# Patient Record
Sex: Female | Born: 1995 | Race: White | Hispanic: No | Marital: Single | Smoking: Never smoker
Health system: Southern US, Community
[De-identification: ages and names within clinical notes are randomized; demographics above are authoritative.]

## PROBLEM LIST (undated history)

## (undated) DIAGNOSIS — F329 Major depressive disorder, single episode, unspecified: Secondary | ICD-10-CM

## (undated) DIAGNOSIS — F32A Depression, unspecified: Secondary | ICD-10-CM

## (undated) HISTORY — PX: WISDOM TOOTH EXTRACTION: SHX21

## (undated) HISTORY — PX: CYST EXCISION: SHX5701

## (undated) HISTORY — DX: Depression, unspecified: F32.A

---
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
## 2013-10-31 ENCOUNTER — Emergency Department (HOSPITAL_COMMUNITY): Payer: BC Managed Care – PPO

## 2013-10-31 ENCOUNTER — Emergency Department (HOSPITAL_COMMUNITY)
Admission: EM | Admit: 2013-10-31 | Discharge: 2013-11-01 | Disposition: A | Discharge status: Home / Self Care | Payer: BC Managed Care – PPO | Attending: Emergency Medicine | Admitting: Emergency Medicine

## 2013-10-31 ENCOUNTER — Encounter (HOSPITAL_COMMUNITY): Payer: Self-pay | Admitting: Emergency Medicine

## 2013-10-31 DIAGNOSIS — Y9368 Activity, volleyball (beach) (court): Secondary | ICD-10-CM | POA: Insufficient documentation

## 2013-10-31 DIAGNOSIS — X500XXA Overexertion from strenuous movement or load, initial encounter: Secondary | ICD-10-CM | POA: Insufficient documentation

## 2013-10-31 DIAGNOSIS — Y9239 Other specified sports and athletic area as the place of occurrence of the external cause: Secondary | ICD-10-CM | POA: Insufficient documentation

## 2013-10-31 DIAGNOSIS — S93499A Sprain of other ligament of unspecified ankle, initial encounter: Secondary | ICD-10-CM | POA: Insufficient documentation

## 2013-10-31 DIAGNOSIS — S93401A Sprain of unspecified ligament of right ankle, initial encounter: Secondary | ICD-10-CM

## 2013-10-31 MED ORDER — HYDROCODONE-ACETAMINOPHEN 5-325 MG PO TABS
1.0000 | ORAL_TABLET | Freq: Once | ORAL | Status: AC
  Administered 2013-10-31: 1 via ORAL
  Filled 2013-10-31: qty 1

## 2013-10-31 NOTE — ED Notes (Signed)
Pt injured her ankle during volleyball practice tonight

## 2013-10-31 NOTE — ED Provider Notes (Signed)
CSN: [id number]     Arrival date & time 10/31/13  2326 History   First MD Initiated Contact with Patient 10/31/13 2333     Chief Complaint  Patient presents with  . Ankle Pain   (Consider location/radiation/quality/duration/timing/severity/associated sxs/prior Treatment) HPI History provided by pt.   Pt was playing volleyball and felt 2 pops over her achilles tendon and one at lateral ankle.  Does not know how the injury occurred.  Has had pain of lateral ankle and over achilles w/ inability to bear weight ever since.  Pain aggravated by minimal ROM.  No associated paresthesias.   History reviewed. No pertinent past medical history. History reviewed. No pertinent past surgical history. History reviewed. No pertinent family history. History  Substance Use Topics  . Smoking status: Never Smoker   . Smokeless tobacco: Not on file  . Alcohol Use: No   OB History   Grav Para Term Preterm Abortions TAB SAB Ect Mult Living                 Review of Systems  All other systems reviewed and are negative.    Allergies  Review of patient's allergies indicates not on file.  Home Medications  No current outpatient prescriptions on file. BP 131/90  Pulse 111  Temp(Src) 98.5 F (36.9 C) (Oral)  Ht 5\' 5"  (1.651 m)  Wt 160 lb (72.576 kg)  BMI 26.63 kg/m2  SpO2 100%  LMP 10/31/2013 Physical Exam  Nursing note and vitals reviewed. Constitutional: She is oriented to person, place, and time. She appears well-developed and well-nourished. No distress.  HENT:  Head: Normocephalic and atraumatic.  Eyes:  Normal appearance  Neck: Normal range of motion.  Pulmonary/Chest: Effort normal.  Musculoskeletal: Normal range of motion.  No deformity, edema, ecchymosis right foot/ankle.  Achilles intact with palpation but very ttp.  Negative Thompson test, but very painful.  Tenderness inferior to lateral malleolus and pain w/ passive flexion/extension.  2+ DP pulse and distal sensation intact.     Neurological: She is alert and oriented to person, place, and time.  Psychiatric: She has a normal mood and affect. Her behavior is normal.    ED Course  Procedures (including critical care time) Labs Review Labs Reviewed - No data to display Imaging Review Dg Ankle Complete Right  11/01/2013   CLINICAL DATA:  Ankle pain after injury.  EXAM: RIGHT ANKLE - COMPLETE 3+ VIEW  COMPARISON:  None.  FINDINGS: There is no evidence of fracture, dislocation, or joint effusion. There is no evidence of arthropathy or other focal bone abnormality. Soft tissues are unremarkable.  IMPRESSION: Negative.   Electronically Signed   By: Tiburcio Pea M.D.   On: 11/01/2013 00:13    EKG Interpretation   None       MDM   1. Sprain of right ankle, initial encounter    17yo F presents w/ right ankle injury.  I am suspicious for lateral ankle as well as achilles sprain.  Xray pending.  1 vicodin ordered for pain.  11:42 PM   Xray negative.  Results discussed w/ pt.  Pt received one vicodin for pain in ED.  Ortho tech fitted her with ASO and provided her with crutches.  I recommended NSAID and RICE at home.  Referred to ortho for persistent/worsening sx.  12:22 AM     Otilio Miu, PA-C 11/01/13 0022

## 2013-11-01 MED ORDER — IBUPROFEN 800 MG PO TABS: 800.0000 mg | ORAL_TABLET | Freq: Three times a day (TID) | ORAL | Status: DC

## 2013-11-01 NOTE — ED Provider Notes (Signed)
Medical screening examination/treatment/procedure(s) were performed by non-physician practitioner and as supervising physician I was immediately available for consultation/collaboration.    Benny Henrie M Jahnay Lantier, MD 11/01/13 0336 

## 2014-02-07 ENCOUNTER — Emergency Department (HOSPITAL_COMMUNITY)
Admission: EM | Admit: 2014-02-07 | Discharge: 2014-02-07 | Disposition: A | Discharge status: Home / Self Care | Payer: BC Managed Care – PPO | Attending: Emergency Medicine | Admitting: Emergency Medicine

## 2014-02-07 ENCOUNTER — Encounter (HOSPITAL_COMMUNITY): Payer: Self-pay | Admitting: Emergency Medicine

## 2014-02-07 DIAGNOSIS — R110 Nausea: Secondary | ICD-10-CM | POA: Insufficient documentation

## 2014-02-07 DIAGNOSIS — Z791 Long term (current) use of non-steroidal anti-inflammatories (NSAID): Secondary | ICD-10-CM | POA: Insufficient documentation

## 2014-02-07 DIAGNOSIS — J029 Acute pharyngitis, unspecified: Secondary | ICD-10-CM | POA: Insufficient documentation

## 2014-02-07 MED ORDER — ACETAMINOPHEN-CODEINE #3 300-30 MG PO TABS: 1.0000 | ORAL_TABLET | Freq: Four times a day (QID) | ORAL | Status: DC | PRN

## 2014-02-07 NOTE — Discharge Instructions (Signed)
Viral Pharyngitis Viral pharyngitis is a viral infection that produces redness, pain, and swelling (inflammation) of the throat. It can spread from person to person (contagious). CAUSES Viral pharyngitis is caused by inhaling a large amount of certain germs called viruses. Many different viruses cause viral pharyngitis. SYMPTOMS Symptoms of viral pharyngitis include:  Sore throat.  Tiredness.  Stuffy nose.  Low-grade fever.  Congestion.  Cough. TREATMENT Treatment includes rest, drinking plenty of fluids, and the use of over-the-counter medication (approved by your caregiver). HOME CARE INSTRUCTIONS   Drink enough fluids to keep your urine clear or pale yellow.  Eat soft, cold foods such as ice cream, frozen ice pops, or gelatin dessert.  Gargle with warm salt water (1 tsp salt per 1 qt of water).  If over age 7, throat lozenges may be used safely.  Only take over-the-counter or prescription medicines for pain, discomfort, or fever as directed by your caregiver. Do not take aspirin. To help prevent spreading viral pharyngitis to others, avoid:  Mouth-to-mouth contact with others.  Sharing utensils for eating and drinking.  Coughing around others. SEEK MEDICAL CARE IF:   You are better in a few days, then become worse.  You have a fever or pain not helped by pain medicines.  There are any other changes that concern you. Document Released: 09/10/2005 Document Revised: 02/23/2012 Document Reviewed: 02/06/2011 Exit[employer] Patient Information 2014 [employer].  

## 2014-02-07 NOTE — ED Notes (Signed)
Pt reports 12 day hx of cough, nausea, sore throat. Tx with OTC meds with minimal relief. Pt is AO x3 .Friend at bedside

## 2014-02-07 NOTE — ED Provider Notes (Signed)
CSN: [id number]     Arrival date & time 02/07/14  1133 History  This chart was scribed for non-physician practitioner, Roxy Horsemanobert Bettye Sitton, PA-C working with Juliet RudeNathan R. Rubin PayorPickering, MD by Greggory StallionKayla Andersen, ED scribe. This patient was seen in room WTR9/WTR9 and the patient's care was started at 12:16 PM.   Chief Complaint  Patient presents with  . Sore Throat  . Nausea  . Cough     x 12 days   The history is provided by the patient. No language interpreter was used.   HPI Comments: Daisy Meyers is a 18 y.o. female who presents to the Emergency Department complaining of gradual onset, constant sore throat that started 12 days ago. Pt has also had rhinorrhea, cough and nausea. She is unsure if she has had a fever but does not currently have one. Pt has taken DayQuil with some relief of symptoms. Denise emesis, diarrhea, constipation. Denies sick contacts.   History reviewed. No pertinent past medical history. Past Surgical History  Procedure Laterality Date  . Cyst excision     History reviewed. No pertinent family history. History  Substance Use Topics  . Smoking status: Never Smoker   . Smokeless tobacco: Not on file  . Alcohol Use: Yes   OB History   Grav Para Term Preterm Abortions TAB SAB Ect Mult Living                 Review of Systems  Constitutional: Negative for fever.  HENT: Positive for rhinorrhea and sore throat.   Eyes: Negative for redness.  Respiratory: Positive for cough.   Gastrointestinal: Positive for nausea. Negative for vomiting, diarrhea and constipation.  Musculoskeletal: Negative for gait problem.  Skin: Negative for wound.  Neurological: Negative for speech difficulty.  Psychiatric/Behavioral: Negative for confusion.   Allergies  Review of patient's allergies indicates no known allergies.  Home Medications   Current Outpatient Rx  Name  Route  Sig  Dispense  Refill  . ibuprofen (ADVIL,MOTRIN) 800 MG tablet   Oral   Take 1 tablet (800 mg total) by  mouth 3 (three) times daily.   12 tablet   0    BP 126/69  Pulse 89  Temp(Src) 97.5 F (36.4 C) (Oral)  Resp 18  Wt 185 lb (83.915 kg)  SpO2 99%  LMP 01/24/2014  Physical Exam  Nursing note and vitals reviewed. Constitutional: She is oriented to person, place, and time. She appears well-developed and well-nourished. No distress.  HENT:  Head: Normocephalic and atraumatic.  Mouth/Throat: Uvula is midline. Posterior oropharyngeal erythema present. No oropharyngeal exudate.  No peritonsillar abscess. Airway intact.   Eyes: Conjunctivae and EOM are normal.  Cardiovascular: Normal rate and regular rhythm.   Pulmonary/Chest: Effort normal and breath sounds normal. No stridor. No respiratory distress. She has no wheezes. She has no rhonchi. She has no rales.  Abdominal: She exhibits no distension.  Musculoskeletal: She exhibits no edema.  Lymphadenopathy:    She has no cervical adenopathy.  Neurological: She is alert and oriented to person, place, and time. No cranial nerve deficit.  Skin: Skin is warm and dry.  Psychiatric: She has a normal mood and affect.    ED Course  Procedures (including critical care time)  DIAGNOSTIC STUDIES: Oxygen Saturation is 99% on RA, normal by my interpretation.    COORDINATION OF CARE: 12:19 PM-Discussed treatment plan which includes cough medication and Claritin or zyrtec with pt at bedside and pt agreed to plan.   Labs Review Labs Reviewed -  No data to display Imaging Review No results found.  EKG Interpretation   None       MDM   Final diagnoses:  Viral pharyngitis    Pt afebrile without tonsillar exudate, negative strep. Presents with mild cervical lymphadenopathy, & dysphagia; diagnosis of viral pharyngitis. No abx indicated. DC w symptomatic tx for pain  Pt does not appear dehydrated, but did discuss importance of water rehydration. Presentation non concerning for PTA or infxn spread to soft tissue. No trismus or uvula  deviation. Specific return precautions discussed. Pt able to drink water in ED without difficulty with intact air way. Recommended PCP follow up.  I personally performed the services described in this documentation, which was scribed in my presence. The recorded information has been reviewed and is accurate.    Roxy Horseman, PA-C 02/07/14 1702

## 2014-02-08 NOTE — ED Provider Notes (Signed)
Medical screening examination/treatment/procedure(s) were performed by non-physician practitioner and as supervising physician I was immediately available for consultation/collaboration.  EKG Interpretation   None        Michaeljames Milnes R. Audery Wassenaar, MD 02/08/14 0649 

## 2014-02-11 ENCOUNTER — Ambulatory Visit (INDEPENDENT_AMBULATORY_CARE_PROVIDER_SITE_OTHER): Payer: BC Managed Care – PPO | Admitting: Emergency Medicine

## 2014-02-11 ENCOUNTER — Ambulatory Visit: Payer: BC Managed Care – PPO

## 2014-02-11 VITALS — BP 104/70 | HR 104 | Temp 98.1°F | Resp 20 | Ht 64.0 in | Wt 184.0 lb

## 2014-02-11 DIAGNOSIS — R059 Cough, unspecified: Secondary | ICD-10-CM

## 2014-02-11 DIAGNOSIS — R05 Cough: Secondary | ICD-10-CM

## 2014-02-11 DIAGNOSIS — J209 Acute bronchitis, unspecified: Secondary | ICD-10-CM

## 2014-02-11 DIAGNOSIS — J029 Acute pharyngitis, unspecified: Secondary | ICD-10-CM

## 2014-02-11 LAB — POCT RAPID STREP A (OFFICE): Rapid Strep A Screen: NEGATIVE

## 2014-02-11 MED ORDER — HYDROCOD POLST-CHLORPHEN POLST 10-8 MG/5ML PO LQCR: 5.0000 mL | Freq: Two times a day (BID) | ORAL | Status: DC | PRN

## 2014-02-11 MED ORDER — AZITHROMYCIN 250 MG PO TABS: ORAL_TABLET | ORAL | Status: DC

## 2014-02-11 NOTE — Patient Instructions (Signed)

## 2014-02-11 NOTE — Progress Notes (Signed)
[facility] [address], [place] [id number] [phone]  Date:  02/11/2014   Name:  Daisy AlaminStacey Aslinger   DOB:  [birth year]   MRN:  [id number]  PCP:  No primary provider on file.    Chief Complaint: Cough, Sore Throat, Otalgia and Nausea   History of Present Illness:  Daisy AlaminStacey Meyers is a 18 y.o. very pleasant female patient who presents with the following:  Cough for past two weeks.  Cough is productive scant purulent sputum.  No wheezing or shortness of breath.  No nausea or vomiting.  Has a sore throat.  Seen in ER 2/24 and told was viral URI.  No testing done.  No fever or chills  No rash.  No improvement with over the counter medications or other home remedies. Denies other complaint or health concern today.   There are no active problems to display for this patient.   History reviewed. No pertinent past medical history.  Past Surgical History  Procedure Laterality Date  . Cyst excision      History  Substance Use Topics  . Smoking status: Never Smoker   . Smokeless tobacco: Not on file  . Alcohol Use: Yes    History reviewed. No pertinent family history.  No Known Allergies  Medication list has been reviewed and updated.  Current Outpatient Prescriptions on File Prior to Visit  Medication Sig Dispense Refill  . acetaminophen-codeine (TYLENOL #3) 300-30 MG per tablet Take 1-2 tablets by mouth every 6 (six) hours as needed for moderate pain.  15 tablet  0  . Pseudoeph-Doxylamine-DM-APAP (DAYQUIL/NYQUIL COLD/FLU RELIEF PO) Take 2 capsules by mouth daily.      . Multiple Vitamin (MULTIVITAMIN WITH MINERALS) TABS tablet Take 2 tablets by mouth daily.       No current facility-administered medications on file prior to visit.    Review of Systems:  As per HPI, otherwise negative.    Physical Examination: Filed Vitals:   02/11/14 1739  BP: 104/70  Pulse: 104  Temp: 98.1 F (36.7 C)  Resp: 20   Filed Vitals:   02/11/14 1739  Height: 5'  4" (1.626 m)  Weight: 184 lb (83.462 kg)   Body mass index is 31.57 kg/(m^2). Ideal Body Weight: Weight in (lb) to have BMI = 25: 145.3  GEN: WDWN, NAD, Non-toxic, A & O x 3 HEENT: Atraumatic, Normocephalic. Neck supple. No masses, No LAD. Ears and Nose: No external deformity. CV: RRR, No M/G/R. No JVD. No thrill. No extra heart sounds. PULM: CTA B, no wheezes, crackles, rhonchi. No retractions. No resp. distress. No accessory muscle use. ABD: S, NT, ND, +BS. No rebound. No HSM. EXTR: No c/c/e NEURO Normal gait.  PSYCH: Normally interactive. Conversant. Not depressed or anxious appearing.  Calm demeanor.    Assessment and Plan: Bronchitis Pharyngitis zpak tussionex   Signed,  Phillips OdorJeffery Shaunika Italiano, MD   Results for orders placed in visit on 02/11/14  POCT RAPID STREP A (OFFICE)      Result Value Ref Range   Rapid Strep A Screen Negative  Negative     [facility] reading (PRIMARY) by  Dr. Dareen PianoAnderson.  Negative chest.

## 2014-09-02 ENCOUNTER — Ambulatory Visit (INDEPENDENT_AMBULATORY_CARE_PROVIDER_SITE_OTHER): Payer: BC Managed Care – PPO | Admitting: Emergency Medicine

## 2014-09-02 VITALS — BP 108/70 | HR 96 | Temp 97.6°F | Resp 18 | Ht 65.0 in | Wt 185.0 lb

## 2014-09-02 DIAGNOSIS — J209 Acute bronchitis, unspecified: Secondary | ICD-10-CM

## 2014-09-02 DIAGNOSIS — J018 Other acute sinusitis: Secondary | ICD-10-CM

## 2014-09-02 MED ORDER — PROMETHAZINE-CODEINE 6.25-10 MG/5ML PO SYRP: 5.0000 mL | ORAL_SOLUTION | Freq: Four times a day (QID) | ORAL | Status: DC | PRN

## 2014-09-02 MED ORDER — PSEUDOEPHEDRINE-GUAIFENESIN ER 60-600 MG PO TB12: 1.0000 | ORAL_TABLET | Freq: Two times a day (BID) | ORAL | Status: DC

## 2014-09-02 MED ORDER — AMOXICILLIN-POT CLAVULANATE 875-125 MG PO TABS: 1.0000 | ORAL_TABLET | Freq: Two times a day (BID) | ORAL | Status: DC

## 2014-09-02 NOTE — Patient Instructions (Signed)

## 2014-09-02 NOTE — Progress Notes (Signed)
[facility] [address] [place] [address] [phone]  Date:  09/02/2014   Name:  Daisy Meyers   DOB:  08-23-1996   MRN:  [id number]  PCP:  No primary provider on file.    Chief Complaint: Nasal Congestion and Cough   History of Present Illness:  Daisy Meyers is a 18 y.o. very pleasant female patient who presents with the following:  Ill for two weeks with nasal congestion and purulent nasal drainage.  Has post nasal drip. Cough productive of mucopurulent sputum.  No wheezing or shortness of breath.  No headache or sore throat No fever or chills. No nausea or vomiting. No stool change No improvement with over the counter medications or other home remedies. Denies other complaint or health concern today.   There are no active problems to display for this patient.   History reviewed. No pertinent past medical history.  Past Surgical History  Procedure Laterality Date  . Cyst excision    . Wisdom tooth extraction      History  Substance Use Topics  . Smoking status: Never Smoker   . Smokeless tobacco: Not on file  . Alcohol Use: Yes    History reviewed. No pertinent family history.  No Known Allergies  Medication list has been reviewed and updated.  Current Outpatient Prescriptions on File Prior to Visit  Medication Sig Dispense Refill  . Pseudoeph-Doxylamine-DM-APAP (DAYQUIL/NYQUIL COLD/FLU RELIEF PO) Take 2 capsules by mouth daily.      Marland Kitchen acetaminophen-codeine (TYLENOL #3) 300-30 MG per tablet Take 1-2 tablets by mouth every 6 (six) hours as needed for moderate pain.  15 tablet  0  . azithromycin (ZITHROMAX) 250 MG tablet Take 2 tabs PO x 1 dose, then 1 tab PO QD x 4 days  6 tablet  0  . chlorpheniramine-HYDROcodone (TUSSIONEX PENNKINETIC ER) 10-8 MG/5ML LQCR Take 5 mLs by mouth every 12 (twelve) hours as needed.  60 mL  0  . Multiple Vitamin (MULTIVITAMIN WITH MINERALS) TABS tablet Take 2 tablets by mouth daily.       No current  facility-administered medications on file prior to visit.    Review of Systems:  As per HPI, otherwise negative.    Physical Examination: Filed Vitals:   09/02/14 1441  BP: 108/70  Pulse: 96  Temp: 97.6 F (36.4 C)  Resp: 18   Filed Vitals:   09/02/14 1441  Height:  (1.651 m)  Weight: 185 lb (83.915 kg)   Body mass index is 30.79 kg/(m^2). Ideal Body Weight: Weight in (lb) to have BMI = 25: 149.9   GEN: WDWN, NAD, Non-toxic, Alert & Oriented x 3 HEENT: Atraumatic, Normocephalic.  Ears and Nose: No external deformity. EXTR: No clubbing/cyanosis/edema NEURO: Normal gait.  PSYCH: Normally interactive. Conversant. Not depressed or anxious appearing.  Calm demeanor.    Assessment and Plan: Sinusitis Bronchitis augmentin mucinex d Phen c cod  Signed,  Phillips Odor, MD

## 2014-10-27 ENCOUNTER — Other Ambulatory Visit: Payer: Self-pay | Admitting: *Deleted

## 2014-11-03 ENCOUNTER — Other Ambulatory Visit: Payer: Self-pay | Admitting: Orthopedic Surgery

## 2014-11-03 DIAGNOSIS — M25562 Pain in left knee: Secondary | ICD-10-CM

## 2014-11-03 DIAGNOSIS — M79672 Pain in left foot: Secondary | ICD-10-CM

## 2014-11-17 ENCOUNTER — Ambulatory Visit
Admission: RE | Admit: 2014-11-17 | Discharge: 2014-11-17 | Disposition: A | Discharge status: Home / Self Care | Payer: BC Managed Care – PPO | Source: Ambulatory Visit | Attending: Orthopedic Surgery | Admitting: Orthopedic Surgery

## 2014-11-17 DIAGNOSIS — M79672 Pain in left foot: Secondary | ICD-10-CM

## 2014-11-17 DIAGNOSIS — M25562 Pain in left knee: Secondary | ICD-10-CM

## 2015-03-14 ENCOUNTER — Ambulatory Visit (INDEPENDENT_AMBULATORY_CARE_PROVIDER_SITE_OTHER): Payer: BLUE CROSS/BLUE SHIELD | Admitting: Physician Assistant

## 2015-03-14 VITALS — BP 120/82 | HR 78 | Temp 97.8°F | Resp 16 | Ht 64.75 in | Wt 196.0 lb

## 2015-03-14 DIAGNOSIS — R220 Localized swelling, mass and lump, head: Secondary | ICD-10-CM

## 2015-03-14 DIAGNOSIS — R221 Localized swelling, mass and lump, neck: Secondary | ICD-10-CM

## 2015-03-14 DIAGNOSIS — M6248 Contracture of muscle, other site: Secondary | ICD-10-CM

## 2015-03-14 DIAGNOSIS — M62838 Other muscle spasm: Secondary | ICD-10-CM

## 2015-03-14 MED ORDER — CYCLOBENZAPRINE HCL 5 MG PO TABS: 5.0000 mg | ORAL_TABLET | Freq: Three times a day (TID) | ORAL | Status: DC | PRN

## 2015-03-14 NOTE — Progress Notes (Signed)
Subjective:    Patient ID: Daisy Meyers, female    DOB: 1996-08-31, 18 y.o.   MRN: [id number]  Chief Complaint  Patient presents with  . Neck Pain    Awoke with it  . Mass    back of neck/onset 1 year   There are no active problems to display for this patient.  Prior to Admission medications   Medication Sig Start Date End Date Taking? Authorizing Provider  acetaminophen-codeine (TYLENOL #3) 300-30 MG per tablet Take 1-2 tablets by mouth every 6 (six) hours as needed for moderate pain. 02/07/14  Yes Roxy Horsemanobert Browning, PA-C  levonorgestrel-ethinyl estradiol (AVIANE,ALESSE,LESSINA) 0.1-20 MG-MCG tablet Take 1 tablet by mouth daily.   Yes Historical Provider, MD  Multiple Vitamin (MULTIVITAMIN WITH MINERALS) TABS tablet Take 2 tablets by mouth daily.   Yes Historical Provider, MD  cyclobenzaprine (FLEXERIL) 5 MG tablet Take 1 tablet (5 mg total) by mouth 3 (three) times daily as needed for muscle spasms. 03/14/15   Donnajean Lopesodd M Bedie Dominey, PA   Medications, allergies, past medical history, surgical history, family history, social history and problem list reviewed and updated.  HPI  18 yof with no significant pmh presents with neck pain, swelling.   Woke up this am with pain right side back neck. No trauma. No new activities past few days. Has been painful to turn head to right or extend neck.   She also mentions she has had a persistent mass over the same area of the neck for past year. Approx 1" diameter. Lump has not been painful until this am. The pain is located exactly where the lump is.   Denies fever, chills, unintentional wt loss, night sweats, persistent cough, trouble breathing or swallowing.   Review of Systems See HPI.     Objective:   Physical Exam  Constitutional: She is oriented to person, place, and time. She appears well-developed and well-nourished.  Non-toxic appearance. She does not have a sickly appearance. She does not appear ill. No distress.  BP 120/82 mmHg  Pulse 78   Temp(Src) 97.8 F (36.6 C) (Oral)  Resp 16  Ht 5' 4.75" (1.645 m)  Wt 196 lb (88.905 kg)  BMI 32.85 kg/m2  SpO2 100%  LMP 02/17/2015   Neck:    Approx 1" x 1" firm mass over circled area. No overlying erythema. No overlying central punctum. No fluctuance. No surrounding induration. No purulence. TTP over area. Limited rom with neck ext or rotation to right due to pain in area.   Lymphadenopathy:       Head (right side): No submental, no submandibular and no tonsillar adenopathy present.       Head (left side): No submental, no submandibular and no tonsillar adenopathy present.    She has no cervical adenopathy.  Neurological: She is alert and oriented to person, place, and time.  Psychiatric: She has a normal mood and affect. Her speech is normal and behavior is normal.      Assessment & Plan:   18 yof with no significant pmh presents with neck pain, swelling.   Muscle spasms of neck - Plan: cyclobenzaprine (FLEXERIL) 5 MG tablet Swelling, mass, or lump in head and neck - Plan: US Soft Tissue Head/Neck --mass likely a muscle spasm over area, limited rom, ttp, no skin changes to suggest abscess or cyst, no LAN, no constitutional sx --spasm --> flexeril, heat, massage, light rom --neck us ordered as has been present for one year  Donnajean Lopesodd M. Milanie Rosenfield, PA-C Physician Assistant-Certified  [facility]  03/14/2015 3:01 PM

## 2015-03-14 NOTE — Patient Instructions (Signed)
Your swelling is most likely a muscle spasm over the area. Heat, massage, light range of motion and flexeril every 8 hours as needed will help with this. To be safe we have ordered an ultrasound, they'll be in touch with you to get that ordered.  Please come back asap or ER if pain increases.

## 2015-03-22 ENCOUNTER — Ambulatory Visit
Admission: RE | Admit: 2015-03-22 | Discharge: 2015-03-22 | Disposition: A | Discharge status: Home / Self Care | Payer: BLUE CROSS/BLUE SHIELD | Source: Ambulatory Visit | Attending: Physician Assistant | Admitting: Physician Assistant

## 2015-03-22 DIAGNOSIS — R220 Localized swelling, mass and lump, head: Secondary | ICD-10-CM

## 2015-03-22 DIAGNOSIS — R221 Localized swelling, mass and lump, neck: Principal | ICD-10-CM

## 2016-02-18 ENCOUNTER — Ambulatory Visit (INDEPENDENT_AMBULATORY_CARE_PROVIDER_SITE_OTHER): Payer: BLUE CROSS/BLUE SHIELD | Admitting: Family Medicine

## 2016-02-18 VITALS — BP 134/85 | HR 104 | Temp 98.2°F | Resp 20 | Ht 65.0 in | Wt 196.8 lb

## 2016-02-18 DIAGNOSIS — J329 Chronic sinusitis, unspecified: Secondary | ICD-10-CM | POA: Diagnosis not present

## 2016-02-18 DIAGNOSIS — J310 Chronic rhinitis: Secondary | ICD-10-CM

## 2016-02-18 MED ORDER — AMOXICILLIN 875 MG PO TABS: 875.0000 mg | ORAL_TABLET | Freq: Two times a day (BID) | ORAL | Status: DC

## 2016-02-18 MED ORDER — IPRATROPIUM BROMIDE 0.03 % NA SOLN: 2.0000 | Freq: Two times a day (BID) | NASAL | Status: DC

## 2016-02-18 NOTE — Progress Notes (Signed)
Patient ID: Daisy AlaminStacey Meyers, female    DOB: [birth year]  Age: 20 y.o. MRN: [id number]  Chief Complaint  Patient presents with  . Sore Throat    x 1 week  . Nasal Congestion  . Hoarse    Subjective:   20 year old [facility] [employer] who comes in with a history of having had a sore throat over the past week. She's had some hoarseness. She still has a sore throat, not severe. She has head congestion purulent drainage from her nose. She does not smoke. She has a little cough, not severe. She has not had major fever aches or chills.  Current allergies, medications, problem list, past/family and social histories reviewed.  Objective:  BP 134/85 mmHg  Pulse 104  Temp(Src) 98.2 F (36.8 C) (Oral)  Resp 20  Ht 5\' 5"  (1.651 m)  Wt 196 lb 12.8 oz (89.268 kg)  BMI 32.75 kg/m2  SpO2 98%  LMP 01/17/2016  Pleasant young lady, healthy appearing. TMs are normal. Throat mildly erythematous but no exudate. No lymphadenopathy in the neck. Chest is clear to auscultation. Heart regular without murmur.  Assessment & Plan:   Assessment: 1. Rhinosinusitis       Plan: Viral URI/rhinosinusitis. We'll go ahead and give her antibiotics as is been going on fairly long. Return if not improving. No diagnostic testing done today.  No orders of the defined types were placed in this encounter.    Meds ordered this encounter  Medications  . amoxicillin (AMOXIL) 875 MG tablet    Sig: Take 1 tablet (875 mg total) by mouth 2 (two) times daily.    Dispense:  20 tablet    Refill:  0  . ipratropium (ATROVENT) 0.03 % nasal spray    Sig: Place 2 sprays into both nostrils 2 (two) times daily.    Dispense:  30 mL    Refill:  0         Patient Instructions  Drink plenty of fluids and get enough rest  Use the Atrovent nasal spray 2 sprays each nostril up to 4 times daily as needed for head congestion  Take the Mucinex to thin secretions  Take an over-the-counter antihistamine decongestant such  as Claritin-D or Allegra-D or Zyrtec-D 1 daily for the congestion  Take amoxicillin 875 mg one twice daily for infection  Return if problems     Return if symptoms worsen or fail to improve.   Puneet Selden, MD 02/18/2016

## 2016-02-18 NOTE — Patient Instructions (Signed)
Drink plenty of fluids and get enough rest  Use the Atrovent nasal spray 2 sprays each nostril up to 4 times daily as needed for head congestion  Take the Mucinex to thin secretions  Take an over-the-counter antihistamine decongestant such as Claritin-D or Allegra-D or Zyrtec-D 1 daily for the congestion  Take amoxicillin 875 mg one twice daily for infection  Return if problems

## 2016-05-17 DIAGNOSIS — N390 Urinary tract infection, site not specified: Secondary | ICD-10-CM | POA: Diagnosis not present

## 2016-05-17 DIAGNOSIS — R300 Dysuria: Secondary | ICD-10-CM | POA: Diagnosis not present

## 2016-06-11 DIAGNOSIS — Z309 Encounter for contraceptive management, unspecified: Secondary | ICD-10-CM | POA: Diagnosis not present

## 2016-06-11 DIAGNOSIS — N946 Dysmenorrhea, unspecified: Secondary | ICD-10-CM | POA: Diagnosis not present

## 2016-08-06 DIAGNOSIS — S91322A Laceration with foreign body, left foot, initial encounter: Secondary | ICD-10-CM | POA: Diagnosis not present

## 2016-08-14 ENCOUNTER — Encounter (HOSPITAL_COMMUNITY): Payer: Self-pay | Admitting: *Deleted
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
## 2016-08-15 ENCOUNTER — Ambulatory Visit (HOSPITAL_COMMUNITY): Payer: BLUE CROSS/BLUE SHIELD | Admitting: Certified Registered"

## 2016-08-15 ENCOUNTER — Encounter (HOSPITAL_COMMUNITY)
Admission: RE | Disposition: A | Discharge status: Home / Self Care | Payer: Self-pay | Source: Ambulatory Visit | Attending: Orthopedic Surgery

## 2016-08-15 ENCOUNTER — Ambulatory Visit (HOSPITAL_COMMUNITY)
Admission: RE | Admit: 2016-08-15 | Discharge: 2016-08-15 | Disposition: A | Discharge status: Home / Self Care | Payer: BLUE CROSS/BLUE SHIELD | Source: Ambulatory Visit | Attending: Orthopedic Surgery | Admitting: Orthopedic Surgery

## 2016-08-15 ENCOUNTER — Ambulatory Visit (HOSPITAL_COMMUNITY): Payer: BLUE CROSS/BLUE SHIELD

## 2016-08-15 ENCOUNTER — Encounter (HOSPITAL_COMMUNITY): Payer: Self-pay | Admitting: *Deleted

## 2016-08-15 DIAGNOSIS — W458XXA Other foreign body or object entering through skin, initial encounter: Secondary | ICD-10-CM | POA: Diagnosis not present

## 2016-08-15 DIAGNOSIS — S91322A Laceration with foreign body, left foot, initial encounter: Secondary | ICD-10-CM | POA: Diagnosis not present

## 2016-08-15 DIAGNOSIS — Z419 Encounter for procedure for purposes other than remedying health state, unspecified: Secondary | ICD-10-CM

## 2016-08-15 DIAGNOSIS — S90852A Superficial foreign body, left foot, initial encounter: Secondary | ICD-10-CM | POA: Insufficient documentation

## 2016-08-15 HISTORY — PX: FOOT FOREIGN BODY REMOVAL: SUR1116

## 2016-08-15 HISTORY — PX: FOREIGN BODY REMOVAL: SHX962

## 2016-08-15 LAB — CBC
HCT: 40.7 % (ref 36.0–46.0)
Hemoglobin: 12.9 g/dL (ref 12.0–15.0)
MCH: 27.4 pg (ref 26.0–34.0)
MCHC: 31.7 g/dL (ref 30.0–36.0)
MCV: 86.4 fL (ref 78.0–100.0)
PLATELETS: 216 10*3/uL (ref 150–400)
RBC: 4.71 MIL/uL (ref 3.87–5.11)
RDW: 13.3 % (ref 11.5–15.5)
WBC: 7.6 10*3/uL (ref 4.0–10.5)

## 2016-08-15 LAB — PREGNANCY, URINE: PREG TEST UR: NEGATIVE

## 2016-08-15 SURGERY — REMOVAL FOREIGN BODY EXTREMITY
Anesthesia: General | Site: Foot | Laterality: Left

## 2016-08-15 MED ORDER — FENTANYL CITRATE (PF) 100 MCG/2ML IJ SOLN
25.0000 ug | INTRAMUSCULAR | Status: DC | PRN
  Administered 2016-08-15: 50 ug via INTRAVENOUS
  Administered 2016-08-15 (×2): 25 ug via INTRAVENOUS

## 2016-08-15 MED ORDER — MIDAZOLAM HCL 5 MG/5ML IJ SOLN
INTRAMUSCULAR | Status: DC | PRN
  Administered 2016-08-15: 2 mg via INTRAVENOUS

## 2016-08-15 MED ORDER — PROPOFOL 10 MG/ML IV BOLUS
INTRAVENOUS | Status: DC | PRN
  Administered 2016-08-15: 20 mg via INTRAVENOUS
  Administered 2016-08-15: 70 mg via INTRAVENOUS
  Administered 2016-08-15: 180 mg via INTRAVENOUS

## 2016-08-15 MED ORDER — ONDANSETRON HCL 4 MG/2ML IJ SOLN
INTRAMUSCULAR | Status: AC
  Filled 2016-08-15: qty 2

## 2016-08-15 MED ORDER — MIDAZOLAM HCL 2 MG/2ML IJ SOLN
INTRAMUSCULAR | Status: AC
  Filled 2016-08-15: qty 2

## 2016-08-15 MED ORDER — HYDROCODONE-ACETAMINOPHEN 5-325 MG PO TABS
ORAL_TABLET | ORAL | Status: AC
  Filled 2016-08-15: qty 2

## 2016-08-15 MED ORDER — FENTANYL CITRATE (PF) 100 MCG/2ML IJ SOLN
INTRAMUSCULAR | Status: AC
  Filled 2016-08-15: qty 4

## 2016-08-15 MED ORDER — LACTATED RINGERS IV SOLN: INTRAVENOUS | Status: DC

## 2016-08-15 MED ORDER — CHLORHEXIDINE GLUCONATE 4 % EX LIQD: 60.0000 mL | Freq: Once | CUTANEOUS | Status: DC

## 2016-08-15 MED ORDER — FENTANYL CITRATE (PF) 100 MCG/2ML IJ SOLN
INTRAMUSCULAR | Status: DC | PRN
  Administered 2016-08-15: 25 ug via INTRAVENOUS
  Administered 2016-08-15: 50 ug via INTRAVENOUS
  Administered 2016-08-15 (×3): 25 ug via INTRAVENOUS

## 2016-08-15 MED ORDER — LIDOCAINE HCL (CARDIAC) 20 MG/ML IV SOLN
INTRAVENOUS | Status: DC | PRN
  Administered 2016-08-15: 100 mg via INTRAVENOUS

## 2016-08-15 MED ORDER — FENTANYL CITRATE (PF) 100 MCG/2ML IJ SOLN
INTRAMUSCULAR | Status: AC
  Filled 2016-08-15: qty 2

## 2016-08-15 MED ORDER — CEFAZOLIN SODIUM-DEXTROSE 2-4 GM/100ML-% IV SOLN
INTRAVENOUS | Status: AC
  Filled 2016-08-15: qty 100

## 2016-08-15 MED ORDER — PROPOFOL 10 MG/ML IV BOLUS
INTRAVENOUS | Status: AC
  Filled 2016-08-15: qty 40

## 2016-08-15 MED ORDER — 0.9 % SODIUM CHLORIDE (POUR BTL) OPTIME
TOPICAL | Status: DC | PRN
  Administered 2016-08-15: 1000 mL

## 2016-08-15 MED ORDER — METOCLOPRAMIDE HCL 5 MG/ML IJ SOLN
10.0000 mg | Freq: Once | INTRAMUSCULAR | Status: DC | PRN
Start: 2016-08-15 — End: 2016-08-15

## 2016-08-15 MED ORDER — SODIUM CHLORIDE 0.9 % IR SOLN: Status: DC | PRN

## 2016-08-15 MED ORDER — ONDANSETRON HCL 4 MG/2ML IJ SOLN
INTRAMUSCULAR | Status: DC | PRN
  Administered 2016-08-15: 4 mg via INTRAVENOUS

## 2016-08-15 MED ORDER — MEPERIDINE HCL 25 MG/ML IJ SOLN: 6.2500 mg | INTRAMUSCULAR | Status: DC | PRN

## 2016-08-15 MED ORDER — DEXAMETHASONE SODIUM PHOSPHATE 10 MG/ML IJ SOLN
INTRAMUSCULAR | Status: DC | PRN
  Administered 2016-08-15: 5 mg via INTRAVENOUS

## 2016-08-15 MED ORDER — KETOROLAC TROMETHAMINE 30 MG/ML IJ SOLN
30.0000 mg | Freq: Once | INTRAMUSCULAR | Status: AC
  Administered 2016-08-15: 30 mg via INTRAVENOUS

## 2016-08-15 MED ORDER — LACTATED RINGERS IV SOLN
INTRAVENOUS | Status: DC | PRN
  Administered 2016-08-15: 08:00:00 via INTRAVENOUS

## 2016-08-15 MED ORDER — LIDOCAINE 2% (20 MG/ML) 5 ML SYRINGE
INTRAMUSCULAR | Status: AC
  Filled 2016-08-15: qty 5

## 2016-08-15 MED ORDER — HYDROCODONE-ACETAMINOPHEN 5-325 MG PO TABS: 1.0000 | ORAL_TABLET | Freq: Three times a day (TID) | ORAL | 0 refills | Status: DC | PRN

## 2016-08-15 MED ORDER — KETOROLAC TROMETHAMINE 10 MG PO TABS: 10.0000 mg | ORAL_TABLET | Freq: Four times a day (QID) | ORAL | 0 refills | Status: DC | PRN

## 2016-08-15 MED ORDER — DEXAMETHASONE SODIUM PHOSPHATE 10 MG/ML IJ SOLN
INTRAMUSCULAR | Status: AC
  Filled 2016-08-15: qty 1

## 2016-08-15 MED ORDER — KETOROLAC TROMETHAMINE 30 MG/ML IJ SOLN
INTRAMUSCULAR | Status: AC
  Filled 2016-08-15: qty 1

## 2016-08-15 MED ORDER — CEFAZOLIN SODIUM-DEXTROSE 2-4 GM/100ML-% IV SOLN
2.0000 g | INTRAVENOUS | Status: AC
  Administered 2016-08-15: 2 g via INTRAVENOUS
  Filled 2016-08-15: qty 100

## 2016-08-15 MED ORDER — HYDROCODONE-ACETAMINOPHEN 5-325 MG PO TABS
2.0000 | ORAL_TABLET | Freq: Once | ORAL | Status: AC
  Administered 2016-08-15: 2 via ORAL

## 2016-08-15 SURGICAL SUPPLY — 49 items
BANDAGE ACE 4X5 VEL STRL LF (GAUZE/BANDAGES/DRESSINGS) ×2 IMPLANT
BANDAGE GAUZE 4  KLING STR (GAUZE/BANDAGES/DRESSINGS) ×2 IMPLANT
BLADE SURG 10 STRL SS (BLADE) ×2 IMPLANT
BNDG COHESIVE 4X5 TAN STRL (GAUZE/BANDAGES/DRESSINGS) ×2 IMPLANT
BNDG GAUZE ELAST 4 BULKY (GAUZE/BANDAGES/DRESSINGS) ×4 IMPLANT
BNDG GAUZE STRTCH 6 (GAUZE/BANDAGES/DRESSINGS) ×2 IMPLANT
BRUSH SCRUB DISP (MISCELLANEOUS) ×4 IMPLANT
COVER SURGICAL LIGHT HANDLE (MISCELLANEOUS) ×4 IMPLANT
DRAPE C-ARMOR (DRAPES) ×2 IMPLANT
DRAPE U-SHAPE 47X51 STRL (DRAPES) ×2 IMPLANT
DRSG ADAPTIC 3X8 NADH LF (GAUZE/BANDAGES/DRESSINGS) ×2 IMPLANT
ELECT CAUTERY BLADE 6.4 (BLADE) IMPLANT
ELECT REM PT RETURN 9FT ADLT (ELECTROSURGICAL)
ELECTRODE REM PT RTRN 9FT ADLT (ELECTROSURGICAL) IMPLANT
GAUZE SPONGE 4X4 12PLY STRL (GAUZE/BANDAGES/DRESSINGS) ×2 IMPLANT
GLOVE BIO SURGEON STRL SZ7.5 (GLOVE) ×2 IMPLANT
GLOVE BIO SURGEON STRL SZ8 (GLOVE) ×2 IMPLANT
GLOVE BIOGEL PI IND STRL 7.5 (GLOVE) ×1 IMPLANT
GLOVE BIOGEL PI IND STRL 8 (GLOVE) ×1 IMPLANT
GLOVE BIOGEL PI INDICATOR 7.5 (GLOVE) ×1
GLOVE BIOGEL PI INDICATOR 8 (GLOVE) ×1
GOWN STRL REUS W/ TWL LRG LVL3 (GOWN DISPOSABLE) ×2 IMPLANT
GOWN STRL REUS W/ TWL XL LVL3 (GOWN DISPOSABLE) ×1 IMPLANT
GOWN STRL REUS W/TWL LRG LVL3 (GOWN DISPOSABLE) ×2
GOWN STRL REUS W/TWL XL LVL3 (GOWN DISPOSABLE) ×1
HANDPIECE INTERPULSE COAX TIP (DISPOSABLE)
KIT BASIN OR (CUSTOM PROCEDURE TRAY) ×2 IMPLANT
KIT ROOM TURNOVER OR (KITS) ×2 IMPLANT
MANIFOLD NEPTUNE II (INSTRUMENTS) ×2 IMPLANT
NS IRRIG 1000ML POUR BTL (IV SOLUTION) ×2 IMPLANT
PACK ORTHO EXTREMITY (CUSTOM PROCEDURE TRAY) ×2 IMPLANT
PAD ARMBOARD 7.5X6 YLW CONV (MISCELLANEOUS) ×4 IMPLANT
PADDING CAST ABS 4INX4YD NS (CAST SUPPLIES) ×1
PADDING CAST ABS COTTON 4X4 ST (CAST SUPPLIES) ×1 IMPLANT
PADDING CAST COTTON 6X4 STRL (CAST SUPPLIES) ×2 IMPLANT
SET HNDPC FAN SPRY TIP SCT (DISPOSABLE) IMPLANT
SPONGE GAUZE 4X4 12PLY STER LF (GAUZE/BANDAGES/DRESSINGS) ×2 IMPLANT
SPONGE LAP 18X18 X RAY DECT (DISPOSABLE) ×2 IMPLANT
STOCKINETTE IMPERVIOUS 9X36 MD (GAUZE/BANDAGES/DRESSINGS) ×2 IMPLANT
SUT ETHILON 3 0 PS 1 (SUTURE) ×2 IMPLANT
SUT PDS AB 2-0 CT1 27 (SUTURE) IMPLANT
SUT VIC AB 3-0 FS2 27 (SUTURE) ×2 IMPLANT
TOWEL OR 17X24 6PK STRL BLUE (TOWEL DISPOSABLE) ×2 IMPLANT
TOWEL OR 17X26 10 PK STRL BLUE (TOWEL DISPOSABLE) ×4 IMPLANT
TUBE ANAEROBIC SPECIMEN COL (MISCELLANEOUS) IMPLANT
TUBE CONNECTING 12X1/4 (SUCTIONS) ×2 IMPLANT
UNDERPAD 30X30 (UNDERPADS AND DIAPERS) ×2 IMPLANT
WATER STERILE IRR 1000ML POUR (IV SOLUTION) IMPLANT
YANKAUER SUCT BULB TIP NO VENT (SUCTIONS) ×2 IMPLANT

## 2016-08-15 NOTE — Anesthesia Preprocedure Evaluation (Signed)
Anesthesia Evaluation  Patient identified by MRN, date of birth, ID band Patient awake    Reviewed: Allergy & Precautions, NPO status , Patient's Chart, lab work & pertinent test results  Airway Mallampati: II  TM Distance: >3 FB Neck ROM: Full    Dental no notable dental hx.    Pulmonary neg pulmonary ROS,    Pulmonary exam normal breath sounds clear to auscultation       Cardiovascular negative cardio ROS Normal cardiovascular exam Rhythm:Regular Rate:Normal     Neuro/Psych negative neurological ROS  negative psych ROS   GI/Hepatic negative GI ROS, Neg liver ROS,   Endo/Other  negative endocrine ROS  Renal/GU negative Renal ROS  negative genitourinary   Musculoskeletal negative musculoskeletal ROS (+)   Abdominal   Peds negative pediatric ROS (+)  Hematology negative hematology ROS (+)   Anesthesia Other Findings   Reproductive/Obstetrics negative OB ROS                             Anesthesia Physical Anesthesia Plan  ASA: I  Anesthesia Plan: General   Post-op Pain Management:    Induction: Intravenous  Airway Management Planned: LMA  Additional Equipment:   Intra-op Plan:   Post-operative Plan: Extubation in OR  Informed Consent: I have reviewed the patients History and Physical, chart, labs and discussed the procedure including the risks, benefits and alternatives for the proposed anesthesia with the patient or authorized representative who has indicated his/her understanding and acceptance.   Dental advisory given  Plan Discussed with: CRNA  Anesthesia Plan Comments:         Anesthesia Quick Evaluation  

## 2016-08-15 NOTE — Anesthesia Procedure Notes (Signed)
Procedure Name: LMA Insertion Date/Time: 08/15/2016 8:22 AM Performed by: Lanell MatarBAKER, Masson Nalepa M Pre-anesthesia Checklist: Patient identified, Emergency Drugs available, Suction available and Patient being monitored Patient Re-evaluated:Patient Re-evaluated prior to inductionOxygen Delivery Method: Circle System Utilized Preoxygenation: Pre-oxygenation with 100% oxygen Intubation Type: IV induction Ventilation: Mask ventilation without difficulty LMA: LMA inserted LMA Size: 4.0 Number of attempts: 1 Placement Confirmation: positive ETCO2 Tube secured with: Tape Dental Injury: Teeth and Oropharynx as per pre-operative assessment  Comments: Performed by Mallie SnooksJennifer Campbell, SRNA

## 2016-08-15 NOTE — Transfer of Care (Signed)
Immediate Anesthesia Transfer of Care Note  Patient: Daisy AlaminStacey Meyers  Procedure(s) Performed: Procedure(s): REMOVAL FOREIGN BODY LEFT FOOT (Left)  Patient Location: PACU  Anesthesia Type:General  Level of Consciousness: awake, alert  and oriented  Airway & Oxygen Therapy: Patient Spontanous Breathing and Patient connected to nasal cannula oxygen  Post-op Assessment: Report given to RN, Post -op Vital signs reviewed and stable and Patient moving all extremities X 4  Post vital signs: Reviewed and stable  Last Vitals:  Vitals:   08/15/16 0627 08/15/16 0909  BP: 134/72   Pulse: 81   Resp: 18   Temp: 36.7 C (P) 36.4 C    Last Pain: There were no vitals filed for this visit.       Complications: No apparent anesthesia complications

## 2016-08-15 NOTE — Brief Op Note (Signed)
08/15/2016  4:09 PM  PATIENT:  Hildred AlaminStacey Hestand  20 y.o. female  PRE-OPERATIVE DIAGNOSIS:  loose body left foot  POST-OPERATIVE DIAGNOSIS:  loose body left foot  PROCEDURE:  Procedure(s): REMOVAL FOREIGN BODY LEFT FOOT (Left)  SURGEON:  Surgeon(s) and Role:    * Myrene GalasMichael Nakeysha Pasqual, MD - Primary  PHYSICIAN ASSISTANT: Montez MoritaKEITH PAUL, PAC  ANESTHESIA:   general  EBL:  Total I/O In: 500 [I.V.:500] Out: 3 [Blood:3]  BLOOD ADMINISTERED:none  DRAINS: none   LOCAL MEDICATIONS USED:  NONE  SPECIMEN:  No Specimen  DISPOSITION OF SPECIMEN:  N/A  COUNTS:  YES  TOURNIQUET:  * No tourniquets in log *  DICTATION: .Other Dictation: Dictation Number [id number]  PLAN OF CARE: Discharge to home after PACU  PATIENT DISPOSITION:  PACU - hemodynamically stable.   Delay start of Pharmacological VTE agent (>24hrs) due to surgical blood loss or risk of bleeding: no

## 2016-08-15 NOTE — Anesthesia Postprocedure Evaluation (Signed)
Anesthesia Post Note  Patient: Daisy Meyers  Procedure(s) Performed: Procedure(s) (LRB): REMOVAL FOREIGN BODY LEFT FOOT (Left)  Patient location during evaluation: PACU Anesthesia Type: General Level of consciousness: awake and alert Pain management: pain level controlled Vital Signs Assessment: post-procedure vital signs reviewed and stable Respiratory status: spontaneous breathing, nonlabored ventilation, respiratory function stable and patient connected to nasal cannula oxygen Cardiovascular status: blood pressure returned to baseline and stable Postop Assessment: no signs of nausea or vomiting Anesthetic complications: no    Last Vitals:  Vitals:   08/15/16 0930 08/15/16 0931  BP:    Pulse: 85 87  Resp: (!) 24 20  Temp:      Last Pain:  Vitals:   08/15/16 0931  PainSc: 6                  Phillips Groutarignan, Maira Christon

## 2016-08-15 NOTE — Discharge Instructions (Addendum)
Orthopaedic Trauma Service Discharge Instructions   General Discharge Instructions  WEIGHT BEARING STATUS: Weightbear as tolerated  RANGE OF MOTION/ACTIVITY: as tolerated   Wound Care:daily dressing changes starting on 08/18/2016. See below  Discharge Wound Care Instructions  Do NOT apply any ointments, solutions or lotions to pin sites or surgical wounds.  These prevent needed drainage and even though solutions like hydrogen peroxide kill bacteria, they also damage cells lining the pin sites that help fight infection.  Applying lotions or ointments can keep the wounds moist and can cause them to breakdown and open up as well. This can increase the risk for infection. When in doubt call the office.  Surgical incisions should be dressed daily.  If any drainage is noted, use one layer of adaptic, then gauze, Kerlix, and an ace wrap.  Once the incision is completely dry and without drainage, it may be left open to air out.  Showering may begin 36-48 hours later.  Cleaning gently with soap and water.  Traumatic wounds should be dressed daily as well.    One layer of adaptic, gauze, Kerlix, then ace wrap.  The adaptic can be discontinued once the draining has ceased    If you have a wet to dry dressing: wet the gauze with saline the squeeze as much saline out so the gauze is moist (not soaking wet), place moistened gauze over wound, then place a dry gauze over the moist one, followed by Kerlix wrap, then ace wrap.   PAIN MEDICATION USE AND EXPECTATIONS  You have likely been given narcotic medications to help control your pain.  After a traumatic event that results in an fracture (broken bone) with or without surgery, it is ok to use narcotic pain medications to help control one's pain.  We understand that everyone responds to pain differently and each individual patient will be evaluated on a regular basis for the continued need for narcotic medications. Ideally, narcotic medication use should  last no more than 6-8 weeks (coinciding with fracture healing).   As a patient it is your responsibility as well to monitor narcotic medication use and report the amount and frequency you use these medications when you come to your office visit.   We would also advise that if you are using narcotic medications, you should take a dose prior to therapy to maximize you participation.  IF YOU ARE ON NARCOTIC MEDICATIONS IT IS NOT PERMISSIBLE TO OPERATE A MOTOR VEHICLE (MOTORCYCLE/CAR/TRUCK/MOPED) OR HEAVY MACHINERY DO NOT MIX NARCOTICS WITH OTHER CNS (CENTRAL NERVOUS SYSTEM) DEPRESSANTS SUCH AS ALCOHOL  Diet: as you were eating previously.  Can use over the counter stool softeners and bowel preparations, such as Miralax, to help with bowel movements.  Narcotics can be constipating.  Be sure to drink plenty of fluids    STOP SMOKING OR USING NICOTINE PRODUCTS!!!!  As discussed nicotine severely impairs your body's ability to heal surgical and traumatic wounds but also impairs bone healing.  Wounds and bone heal by forming microscopic blood vessels (angiogenesis) and nicotine is a vasoconstrictor (essentially, shrinks blood vessels).  Therefore, if vasoconstriction occurs to these microscopic blood vessels they essentially disappear and are unable to deliver necessary nutrients to the healing tissue.  This is one modifiable factor that you can do to dramatically increase your chances of healing your injury.    (This means no smoking, no nicotine gum, patches, etc)  DO NOT USE NONSTEROIDAL ANTI-INFLAMMATORY DRUGS (NSAID'S)  Using products such as Advil (ibuprofen), Aleve (naproxen), Motrin (ibuprofen)  for additional pain control during fracture healing can delay and/or prevent the healing response.  If you would like to take over the counter (OTC) medication, Tylenol (acetaminophen) is ok.  However, some narcotic medications that are given for pain control contain acetaminophen as well. Therefore, you  should not exceed more than 4000 mg of tylenol in a day if you do not have liver disease.  Also note that there are may OTC medicines, such as cold medicines and allergy medicines that my contain tylenol as well.  If you have any questions about medications and/or interactions please ask your doctor/PA or your pharmacist.      ICE AND ELEVATE INJURED/OPERATIVE EXTREMITY  Using ice and elevating the injured extremity above your heart can help with swelling and pain control.  Icing in a pulsatile fashion, such as 20 minutes on and 20 minutes off, can be followed.    Do not place ice directly on skin. Make sure there is a barrier between to skin and the ice pack.    Using frozen items such as frozen peas works well as the conform nicely to the are that needs to be iced.  USE AN ACE WRAP OR TED HOSE FOR SWELLING CONTROL  In addition to icing and elevation, Ace wraps or TED hose are used to help limit and resolve swelling.  It is recommended to use Ace wraps or TED hose until you are informed to stop.    When using Ace Wraps start the wrapping distally (farthest away from the body) and wrap proximally (closer to the body)   Example: If you had surgery on your leg or thing and you do not have a splint on, start the ace wrap at the toes and work your way up to the thigh        If you had surgery on your upper extremity and do not have a splint on, start the ace wrap at your fingers and work your way up to the upper arm  IF YOU ARE IN A SPLINT OR CAST DO NOT REMOVE IT FOR ANY REASON   If your splint gets wet for any reason please contact the office immediately. You may shower in your splint or cast as long as you keep it dry.  This can be done by wrapping in a cast cover or garbage back (or similar)  Do Not stick any thing down your splint or cast such as pencils, money, or hangers to try and scratch yourself with.  If you feel itchy take benadryl as prescribed on the bottle for itching  IF YOU ARE IN A  CAM BOOT (BLACK BOOT)  You may remove boot periodically. Perform daily dressing changes as noted below.  Wash the liner of the boot regularly and wear a sock when wearing the boot. It is recommended that you sleep in the boot until told otherwise  CALL THE OFFICE WITH ANY QUESTIONS OR CONCERNS: [phone]
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
## 2016-08-18 ENCOUNTER — Encounter (HOSPITAL_COMMUNITY): Payer: Self-pay | Admitting: Orthopedic Surgery

## 2016-12-03 DIAGNOSIS — S0990XA Unspecified injury of head, initial encounter: Secondary | ICD-10-CM | POA: Diagnosis not present

## 2016-12-03 DIAGNOSIS — S161XXA Strain of muscle, fascia and tendon at neck level, initial encounter: Secondary | ICD-10-CM | POA: Diagnosis not present

## 2016-12-03 DIAGNOSIS — S199XXA Unspecified injury of neck, initial encounter: Secondary | ICD-10-CM | POA: Diagnosis not present

## 2016-12-03 DIAGNOSIS — M542 Cervicalgia: Secondary | ICD-10-CM | POA: Diagnosis not present

## 2016-12-03 DIAGNOSIS — T148XXA Other injury of unspecified body region, initial encounter: Secondary | ICD-10-CM | POA: Diagnosis not present

## 2016-12-10 DIAGNOSIS — M542 Cervicalgia: Secondary | ICD-10-CM | POA: Diagnosis not present

## 2016-12-12 DIAGNOSIS — M542 Cervicalgia: Secondary | ICD-10-CM | POA: Diagnosis not present

## 2016-12-16 DIAGNOSIS — M542 Cervicalgia: Secondary | ICD-10-CM | POA: Diagnosis not present

## 2016-12-17 DIAGNOSIS — D225 Melanocytic nevi of trunk: Secondary | ICD-10-CM | POA: Diagnosis not present

## 2016-12-17 DIAGNOSIS — L814 Other melanin hyperpigmentation: Secondary | ICD-10-CM | POA: Diagnosis not present

## 2016-12-17 DIAGNOSIS — D485 Neoplasm of uncertain behavior of skin: Secondary | ICD-10-CM | POA: Diagnosis not present

## 2016-12-17 DIAGNOSIS — D224 Melanocytic nevi of scalp and neck: Secondary | ICD-10-CM | POA: Diagnosis not present

## 2016-12-17 DIAGNOSIS — D2272 Melanocytic nevi of left lower limb, including hip: Secondary | ICD-10-CM | POA: Diagnosis not present

## 2016-12-19 DIAGNOSIS — D485 Neoplasm of uncertain behavior of skin: Secondary | ICD-10-CM | POA: Diagnosis not present

## 2017-01-12 DIAGNOSIS — M546 Pain in thoracic spine: Secondary | ICD-10-CM | POA: Diagnosis not present

## 2017-01-12 DIAGNOSIS — M25512 Pain in left shoulder: Secondary | ICD-10-CM | POA: Diagnosis not present

## 2017-01-12 DIAGNOSIS — M542 Cervicalgia: Secondary | ICD-10-CM | POA: Diagnosis not present

## 2017-01-15 DIAGNOSIS — M25512 Pain in left shoulder: Secondary | ICD-10-CM | POA: Diagnosis not present

## 2017-01-15 DIAGNOSIS — M542 Cervicalgia: Secondary | ICD-10-CM | POA: Diagnosis not present

## 2017-01-15 DIAGNOSIS — M546 Pain in thoracic spine: Secondary | ICD-10-CM | POA: Diagnosis not present

## 2017-01-19 DIAGNOSIS — M25512 Pain in left shoulder: Secondary | ICD-10-CM | POA: Diagnosis not present

## 2017-01-19 DIAGNOSIS — M542 Cervicalgia: Secondary | ICD-10-CM | POA: Diagnosis not present

## 2017-01-19 DIAGNOSIS — M546 Pain in thoracic spine: Secondary | ICD-10-CM | POA: Diagnosis not present

## 2017-01-22 DIAGNOSIS — M546 Pain in thoracic spine: Secondary | ICD-10-CM | POA: Diagnosis not present

## 2017-01-22 DIAGNOSIS — M25512 Pain in left shoulder: Secondary | ICD-10-CM | POA: Diagnosis not present

## 2017-01-22 DIAGNOSIS — M542 Cervicalgia: Secondary | ICD-10-CM | POA: Diagnosis not present

## 2017-01-26 DIAGNOSIS — M542 Cervicalgia: Secondary | ICD-10-CM | POA: Diagnosis not present

## 2017-01-28 DIAGNOSIS — M25512 Pain in left shoulder: Secondary | ICD-10-CM | POA: Diagnosis not present

## 2017-01-28 DIAGNOSIS — M546 Pain in thoracic spine: Secondary | ICD-10-CM | POA: Diagnosis not present

## 2017-01-28 DIAGNOSIS — M542 Cervicalgia: Secondary | ICD-10-CM | POA: Diagnosis not present

## 2017-02-02 DIAGNOSIS — M25512 Pain in left shoulder: Secondary | ICD-10-CM | POA: Diagnosis not present

## 2017-02-02 DIAGNOSIS — M546 Pain in thoracic spine: Secondary | ICD-10-CM | POA: Diagnosis not present

## 2017-02-02 DIAGNOSIS — M542 Cervicalgia: Secondary | ICD-10-CM | POA: Diagnosis not present

## 2017-02-03 DIAGNOSIS — D235 Other benign neoplasm of skin of trunk: Secondary | ICD-10-CM | POA: Diagnosis not present

## 2017-02-03 DIAGNOSIS — D225 Melanocytic nevi of trunk: Secondary | ICD-10-CM | POA: Diagnosis not present

## 2017-02-03 DIAGNOSIS — D485 Neoplasm of uncertain behavior of skin: Secondary | ICD-10-CM | POA: Diagnosis not present

## 2017-02-05 DIAGNOSIS — M25512 Pain in left shoulder: Secondary | ICD-10-CM | POA: Diagnosis not present

## 2017-02-05 DIAGNOSIS — M546 Pain in thoracic spine: Secondary | ICD-10-CM | POA: Diagnosis not present

## 2017-02-05 DIAGNOSIS — M542 Cervicalgia: Secondary | ICD-10-CM | POA: Diagnosis not present

## 2017-02-09 DIAGNOSIS — M25512 Pain in left shoulder: Secondary | ICD-10-CM | POA: Diagnosis not present

## 2017-02-09 DIAGNOSIS — M546 Pain in thoracic spine: Secondary | ICD-10-CM | POA: Diagnosis not present

## 2017-02-09 DIAGNOSIS — M542 Cervicalgia: Secondary | ICD-10-CM | POA: Diagnosis not present

## 2017-02-11 DIAGNOSIS — M25512 Pain in left shoulder: Secondary | ICD-10-CM | POA: Diagnosis not present

## 2017-02-11 DIAGNOSIS — M542 Cervicalgia: Secondary | ICD-10-CM | POA: Diagnosis not present

## 2017-02-11 DIAGNOSIS — M546 Pain in thoracic spine: Secondary | ICD-10-CM | POA: Diagnosis not present

## 2017-02-20 DIAGNOSIS — D225 Melanocytic nevi of trunk: Secondary | ICD-10-CM | POA: Diagnosis not present

## 2017-02-23 DIAGNOSIS — D225 Melanocytic nevi of trunk: Secondary | ICD-10-CM | POA: Diagnosis not present

## 2017-03-03 ENCOUNTER — Ambulatory Visit (INDEPENDENT_AMBULATORY_CARE_PROVIDER_SITE_OTHER): Payer: BLUE CROSS/BLUE SHIELD | Admitting: Physician Assistant

## 2017-03-03 VITALS — BP 104/72 | HR 73 | Temp 98.1°F | Resp 16 | Ht 65.0 in | Wt 185.0 lb

## 2017-03-03 DIAGNOSIS — Z4802 Encounter for removal of sutures: Secondary | ICD-10-CM

## 2017-03-03 NOTE — Progress Notes (Signed)
   03/03/2017 4:18 PM   DOB: 11-01-1996 / MRN: [id number]  SUBJECTIVE:  [name] is a well appearing 21 y.o. here today for wound care. She denies exquisite tenderness at the site of the wound, nausea, emesis, fever and chills.  She has been compliant with medical therapy and recommendations thus far.   She is allergic to no known allergies.   She  has no past medical history on file.    She  reports that she has never smoked. She has never used smokeless tobacco. She reports that she drinks about 1.8 oz of alcohol per week . She reports that she does not use drugs. She  reports that she does not engage in sexual activity. The patient  has a past surgical history that includes Cyst excision; Wisdom tooth extraction; and Foreign Body Removal (Left, 08/15/2016).  Her family history is not on file.  Review of Systems  Constitutional: Negative for fever.  Gastrointestinal: Negative for nausea.    Problem list and medications reviewed and updated by myself where necessary, and exist elsewhere in the encounter.   OBJECTIVE:  BP 104/72   Pulse 73   Temp 98.1 F (36.7 C) (Oral)   Resp 16   Ht 5\' 5"  (1.651 m)   Wt 185 lb (83.9 kg)   LMP 02/23/2017   SpO2 98%   BMI 30.79 kg/m  CrCl cannot be calculated (No order found.).  Physical Exam  Cardiovascular: Normal rate.   Pulmonary/Chest: Effort normal.       No results found for this or any previous visit (from the past 48 hour(s)).  ASSESSMENT AND PLAN  There are no diagnoses linked to this encounter.  The patient was advised to call or return to clinic if she does not see an improvement in symptoms or to seek the care of the closest emergency department if she worsens with the above plan.   Deliah BostonMichael Clark, MHS, PA-C [facility] 03/03/2017 4:18 PM

## 2017-03-03 NOTE — Patient Instructions (Signed)
     IF you received an x-ray today, you will receive an invoice from [facility]. Please contact  Radiology at [phone] with questions or concerns regarding your invoice.   IF you received labwork today, you will receive an invoice from [facility]. Please contact [facility] at [phone] with questions or concerns regarding your invoice.   Our billing staff will not be able to assist you with questions regarding bills from these companies.  You will be contacted with the lab results as soon as they are available. The fastest way to get your results is to activate your My Chart account. Instructions are located on the last page of this paperwork. If you have not heard from us regarding the results in 2 weeks, please contact this office.     

## 2017-03-04 DIAGNOSIS — M25512 Pain in left shoulder: Secondary | ICD-10-CM | POA: Diagnosis not present

## 2017-03-04 DIAGNOSIS — M546 Pain in thoracic spine: Secondary | ICD-10-CM | POA: Diagnosis not present

## 2017-03-04 DIAGNOSIS — M542 Cervicalgia: Secondary | ICD-10-CM | POA: Diagnosis not present

## 2017-03-09 DIAGNOSIS — M25512 Pain in left shoulder: Secondary | ICD-10-CM | POA: Diagnosis not present

## 2017-03-09 DIAGNOSIS — M546 Pain in thoracic spine: Secondary | ICD-10-CM | POA: Diagnosis not present

## 2017-03-09 DIAGNOSIS — M542 Cervicalgia: Secondary | ICD-10-CM | POA: Diagnosis not present

## 2017-03-11 DIAGNOSIS — M542 Cervicalgia: Secondary | ICD-10-CM | POA: Diagnosis not present

## 2017-03-11 DIAGNOSIS — M546 Pain in thoracic spine: Secondary | ICD-10-CM | POA: Diagnosis not present

## 2017-03-11 DIAGNOSIS — M25512 Pain in left shoulder: Secondary | ICD-10-CM | POA: Diagnosis not present

## 2017-03-16 DIAGNOSIS — M546 Pain in thoracic spine: Secondary | ICD-10-CM | POA: Diagnosis not present

## 2017-03-16 DIAGNOSIS — M25512 Pain in left shoulder: Secondary | ICD-10-CM | POA: Diagnosis not present

## 2017-03-16 DIAGNOSIS — M542 Cervicalgia: Secondary | ICD-10-CM | POA: Diagnosis not present

## 2017-03-18 DIAGNOSIS — M546 Pain in thoracic spine: Secondary | ICD-10-CM | POA: Diagnosis not present

## 2017-03-18 DIAGNOSIS — M25512 Pain in left shoulder: Secondary | ICD-10-CM | POA: Diagnosis not present

## 2017-03-18 DIAGNOSIS — M542 Cervicalgia: Secondary | ICD-10-CM | POA: Diagnosis not present

## 2017-03-23 DIAGNOSIS — M25512 Pain in left shoulder: Secondary | ICD-10-CM | POA: Diagnosis not present

## 2017-03-23 DIAGNOSIS — M546 Pain in thoracic spine: Secondary | ICD-10-CM | POA: Diagnosis not present

## 2017-03-23 DIAGNOSIS — M542 Cervicalgia: Secondary | ICD-10-CM | POA: Diagnosis not present

## 2017-03-25 DIAGNOSIS — M542 Cervicalgia: Secondary | ICD-10-CM | POA: Diagnosis not present

## 2017-03-25 DIAGNOSIS — M546 Pain in thoracic spine: Secondary | ICD-10-CM | POA: Diagnosis not present

## 2017-03-25 DIAGNOSIS — M25512 Pain in left shoulder: Secondary | ICD-10-CM | POA: Diagnosis not present

## 2017-03-30 DIAGNOSIS — M25512 Pain in left shoulder: Secondary | ICD-10-CM | POA: Diagnosis not present

## 2017-03-30 DIAGNOSIS — M542 Cervicalgia: Secondary | ICD-10-CM | POA: Diagnosis not present

## 2017-03-30 DIAGNOSIS — M546 Pain in thoracic spine: Secondary | ICD-10-CM | POA: Diagnosis not present

## 2017-04-02 DIAGNOSIS — M25512 Pain in left shoulder: Secondary | ICD-10-CM | POA: Diagnosis not present

## 2017-04-02 DIAGNOSIS — M542 Cervicalgia: Secondary | ICD-10-CM | POA: Diagnosis not present

## 2017-04-02 DIAGNOSIS — M546 Pain in thoracic spine: Secondary | ICD-10-CM | POA: Diagnosis not present

## 2017-04-06 DIAGNOSIS — M25512 Pain in left shoulder: Secondary | ICD-10-CM | POA: Diagnosis not present

## 2017-04-06 DIAGNOSIS — M546 Pain in thoracic spine: Secondary | ICD-10-CM | POA: Diagnosis not present

## 2017-04-06 DIAGNOSIS — M542 Cervicalgia: Secondary | ICD-10-CM | POA: Diagnosis not present

## 2017-04-08 DIAGNOSIS — M542 Cervicalgia: Secondary | ICD-10-CM | POA: Diagnosis not present

## 2017-04-08 DIAGNOSIS — M25512 Pain in left shoulder: Secondary | ICD-10-CM | POA: Diagnosis not present

## 2017-04-08 DIAGNOSIS — M546 Pain in thoracic spine: Secondary | ICD-10-CM | POA: Diagnosis not present

## 2017-04-13 DIAGNOSIS — M542 Cervicalgia: Secondary | ICD-10-CM | POA: Diagnosis not present

## 2017-04-13 DIAGNOSIS — M25512 Pain in left shoulder: Secondary | ICD-10-CM | POA: Diagnosis not present

## 2017-04-13 DIAGNOSIS — M546 Pain in thoracic spine: Secondary | ICD-10-CM | POA: Diagnosis not present

## 2017-04-14 DIAGNOSIS — M542 Cervicalgia: Secondary | ICD-10-CM | POA: Diagnosis not present

## 2017-04-14 DIAGNOSIS — M546 Pain in thoracic spine: Secondary | ICD-10-CM | POA: Diagnosis not present

## 2017-04-22 DIAGNOSIS — M25512 Pain in left shoulder: Secondary | ICD-10-CM | POA: Diagnosis not present

## 2017-04-22 DIAGNOSIS — M546 Pain in thoracic spine: Secondary | ICD-10-CM | POA: Diagnosis not present

## 2017-04-22 DIAGNOSIS — M542 Cervicalgia: Secondary | ICD-10-CM | POA: Diagnosis not present

## 2017-04-24 DIAGNOSIS — M546 Pain in thoracic spine: Secondary | ICD-10-CM | POA: Diagnosis not present

## 2017-04-24 DIAGNOSIS — M542 Cervicalgia: Secondary | ICD-10-CM | POA: Diagnosis not present

## 2017-04-24 DIAGNOSIS — M25512 Pain in left shoulder: Secondary | ICD-10-CM | POA: Diagnosis not present

## 2017-04-28 DIAGNOSIS — M25512 Pain in left shoulder: Secondary | ICD-10-CM | POA: Diagnosis not present

## 2017-04-28 DIAGNOSIS — M546 Pain in thoracic spine: Secondary | ICD-10-CM | POA: Diagnosis not present

## 2017-05-01 DIAGNOSIS — M546 Pain in thoracic spine: Secondary | ICD-10-CM | POA: Diagnosis not present

## 2017-05-01 DIAGNOSIS — M542 Cervicalgia: Secondary | ICD-10-CM | POA: Diagnosis not present

## 2017-05-01 DIAGNOSIS — M25512 Pain in left shoulder: Secondary | ICD-10-CM | POA: Diagnosis not present

## 2017-05-04 DIAGNOSIS — M546 Pain in thoracic spine: Secondary | ICD-10-CM | POA: Diagnosis not present

## 2017-05-04 DIAGNOSIS — M542 Cervicalgia: Secondary | ICD-10-CM | POA: Diagnosis not present

## 2017-05-04 DIAGNOSIS — M25512 Pain in left shoulder: Secondary | ICD-10-CM | POA: Diagnosis not present

## 2017-05-06 DIAGNOSIS — M25512 Pain in left shoulder: Secondary | ICD-10-CM | POA: Diagnosis not present

## 2017-05-06 DIAGNOSIS — M542 Cervicalgia: Secondary | ICD-10-CM | POA: Diagnosis not present

## 2017-05-06 DIAGNOSIS — M546 Pain in thoracic spine: Secondary | ICD-10-CM | POA: Diagnosis not present

## 2017-05-13 DIAGNOSIS — M542 Cervicalgia: Secondary | ICD-10-CM | POA: Diagnosis not present

## 2017-05-13 DIAGNOSIS — M546 Pain in thoracic spine: Secondary | ICD-10-CM | POA: Diagnosis not present

## 2017-05-13 DIAGNOSIS — M25512 Pain in left shoulder: Secondary | ICD-10-CM | POA: Diagnosis not present

## 2017-05-29 ENCOUNTER — Ambulatory Visit (INDEPENDENT_AMBULATORY_CARE_PROVIDER_SITE_OTHER): Payer: BLUE CROSS/BLUE SHIELD

## 2017-05-29 ENCOUNTER — Encounter: Payer: Self-pay | Admitting: Physician Assistant

## 2017-05-29 ENCOUNTER — Ambulatory Visit (INDEPENDENT_AMBULATORY_CARE_PROVIDER_SITE_OTHER): Payer: BLUE CROSS/BLUE SHIELD | Admitting: Physician Assistant

## 2017-05-29 VITALS — BP 120/80 | HR 84 | Temp 98.2°F | Resp 18 | Ht 64.17 in | Wt 181.0 lb

## 2017-05-29 DIAGNOSIS — M25512 Pain in left shoulder: Secondary | ICD-10-CM | POA: Diagnosis not present

## 2017-05-29 DIAGNOSIS — S4992XA Unspecified injury of left shoulder and upper arm, initial encounter: Secondary | ICD-10-CM | POA: Diagnosis not present

## 2017-05-29 DIAGNOSIS — M62838 Other muscle spasm: Secondary | ICD-10-CM | POA: Diagnosis not present

## 2017-05-29 MED ORDER — MELOXICAM 7.5 MG PO TABS: 7.5000 mg | ORAL_TABLET | Freq: Every day | ORAL | 0 refills | Status: DC

## 2017-05-29 MED ORDER — CYCLOBENZAPRINE HCL 5 MG PO TABS: 5.0000 mg | ORAL_TABLET | Freq: Three times a day (TID) | ORAL | 0 refills | Status: DC | PRN

## 2017-05-29 NOTE — Patient Instructions (Addendum)
There is no acute fracture or abnormality on your shoulder image,which is great. I believe we should treat your muscle spasm and pain with a muscle relaxant and strong antiinflammatory. I also recommend applying heat to the affected area 4-5 x a day for 20 minutes at a time and stretch daily. Use these medications until the pain resolves, which hopefully will be in the next 4-5 days. Continue to hydrate daily with at least 64 oz of water daily. Please return to clinic if symptoms worsen, do not improve, or as needed.    Mid-Back Strain Rehab Ask your health care provider which exercises are safe for you. Do exercises exactly as told by your health care provider and adjust them as directed. It is normal to feel mild stretching, pulling, tightness, or discomfort as you do these exercises, but you should stop right away if you feel sudden pain or your pain gets worse. Do not begin these exercises until told by your health care provider. Stretching and range of motion exercises This exercise warms up your muscles and joints and improves the movement and flexibility of your back and shoulders. This exercise also help to relieve pain. Exercise A: Chest and spine stretch  1. Lie down on your back on a firm surface. 2. Roll a towel or a small blanket so it is about 4 inches (10 cm) in diameter. 3. Put the towel lengthwise under the middle of your back so it is under your spine, but not under your shoulder blades. 4. To increase the stretch, you may put your hands behind your head and let your elbows fall to your sides. 5. Hold for __________ seconds. Repeat exercise __________ times. Complete this exercise __________ times a day. Strengthening exercises These exercises build strength and endurance in your back and your shoulder blade muscles. Endurance is the ability to use your muscles for a long time, even after they get tired. Exercise B: Alternating arm and leg raises  1. Get on your hands and knees  on a firm surface. If you are on a hard floor, you may want to use padding to cushion your knees, such as an exercise mat. 2. Line up your arms and legs. Your hands should be below your shoulders, and your knees should be below your hips. 3. Lift your left leg behind you. At the same time, raise your right arm and straighten it in front of you. ? Do not lift your leg higher than your hip. ? Do not lift your arm higher than your shoulder. ? Keep your abdominal and back muscles tight. ? Keep your hips facing the ground. ? Do not arch your back. ? Keep your balance carefully, and do not hold your breath. 4. Hold for __________ seconds. 5. Slowly return to the starting position and repeat with your right leg and your left arm. Repeat __________ times. Complete this exercise __________ times a day. Exercise C: Straight arm rows ( shoulder extension) 1. Stand with your feet shoulder width apart. 2. Secure an exercise band to a stable object in front of you so the band is at or above shoulder height. 3. Hold one end of the exercise band in each hand. 4. Straighten your elbows and lift your hands up to shoulder height. 5. Step back, away from the secured end of the exercise band, until the band stretches. 6. Squeeze your shoulder blades together and pull your hands down to the sides of your thighs. Stop when your hands are straight down  by your sides. Do not let your hands go behind your body. 7. Hold for __________ seconds. 8. Slowly return to the starting position. Repeat __________ times. Complete this exercise __________ times a day. Exercise D: Shoulder external rotation, prone 1. Lie on your abdomen on a firm bed so your left / right forearm hangs over the edge of the bed and your upper arm is on the bed, straight out from your body. ? Your elbow should be bent. ? Your palm should be facing your feet. 2. If instructed, hold a __________ weight in your hand. 3. Squeeze your shoulder blade  toward the middle of your back. Do not let your shoulder lift toward your ear. 4. Keep your elbow bent in an "L" shape (90 degrees) while you slowly move your forearm up toward the ceiling. Move your forearm up to the height of the bed, toward your head. ? Your upper arm should not move. ? At the top of the movement, your palm should face the floor. 5. Hold for __________ seconds. 6. Slowly return to the starting position and relax your muscles. Repeat __________ times. Complete this exercise __________ times a day. Exercise E: Scapular retraction and external rotation, rowing  1. Sit in a stable chair without armrests, or stand. 2. Secure an exercise band to a stable object in front of you so it is at shoulder height. 3. Hold one end of the exercise band in each hand. 4. Bring your arms out straight in front of you. 5. Step back, away from the secured end of the exercise band, until the band stretches. 6. Pull the band backward. As you do this, bend your elbows and squeeze your shoulder blades together, but avoid letting the rest of your body move. Do not let your shoulders lift up toward your ears. 7. Stop when your elbows are at your sides or slightly behind your body. 8. Hold for __________ seconds. 9. Slowly straighten your arms to return to the starting position. Repeat __________ times. Complete this exercise __________ times a day. Posture and body mechanics  Body mechanics refers to the movements and positions of your body while you do your daily activities. Posture is part of body mechanics. Good posture and healthy body mechanics can help to relieve stress in your body's tissues and joints. Good posture means that your spine is in its natural S-curve position (your spine is neutral), your shoulders are pulled back slightly, and your head is not tipped forward. The following are general guidelines for applying improved posture and body mechanics to your everyday  activities. Standing   When standing, keep your spine neutral and your feet about hip-width apart. Keep a slight bend in your knees. Your ears, shoulders, and hips should line up.  When you do a task in which you lean forward while standing in one place for a long time, place one foot up on a stable object that is 2-4 inches (5-10 cm) high, such as a footstool. This helps keep your spine neutral. Sitting   When sitting, keep your spine neutral and keep your feet flat on the floor. Use a footrest, if necessary, and keep your thighs parallel to the floor. Avoid rounding your shoulders, and avoid tilting your head forward.  When working at a desk or a computer, keep your desk at a height where your hands are slightly lower than your elbows. Slide your chair under your desk so you are close enough to maintain good posture.  When working  at a computer, place your monitor at a height where you are looking straight ahead and you do not have to tilt your head forward or downward to look at the screen. Resting  When lying down and resting, avoid positions that are most painful for you.  If you have pain with activities such as sitting, bending, stooping, or squatting (flexion-based activities), lie in a position in which your body does not bend very much. For example, avoid curling up on your side with your arms and knees near your chest (fetal position).  If you have pain with activities such as standing for a long time or reaching with your arms (extension-based activities), lie with your spine in a neutral position and bend your knees slightly. Try the following positions:  Lying on your side with a pillow between your knees.  Lying on your back with a pillow under your knees.  Lifting   When lifting objects, keep your feet at least shoulder-width apart and tighten your abdominal muscles.  Bend your knees and hips and keep your spine neutral. It is important to lift using the strength of  your legs, not your back. Do not lock your knees straight out.  Always ask for help to lift heavy or awkward objects. This information is not intended to replace advice given to you by your health care provider. Make sure you discuss any questions you have with your health care provider. Document Released: 12/01/2005 Document Revised: 08/07/2016 Document Reviewed: 09/12/2015 Elsevier Interactive Patient Education  2018 [employer].  IF you received an x-ray today, you will receive an invoice from [facility]. Please contact [facility] at [phone] with questions or concerns regarding your invoice.   IF you received labwork today, you will receive an invoice from [place]. Please contact [facility] at [phone] with questions or concerns regarding your invoice.   Our billing staff will not be able to assist you with questions regarding bills from these companies.  You will be contacted with the lab results as soon as they are available. The fastest way to get your results is to activate your My Chart account. Instructions are located on the last page of this paperwork. If you have not heard from [place] regarding the results in 2 weeks, please contact this office.
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
## 2017-09-02 DIAGNOSIS — B9689 Other specified bacterial agents as the cause of diseases classified elsewhere: Secondary | ICD-10-CM | POA: Diagnosis not present

## 2017-09-22 DIAGNOSIS — D2261 Melanocytic nevi of right upper limb, including shoulder: Secondary | ICD-10-CM | POA: Diagnosis not present

## 2017-09-22 DIAGNOSIS — D225 Melanocytic nevi of trunk: Secondary | ICD-10-CM | POA: Diagnosis not present

## 2017-09-22 DIAGNOSIS — L905 Scar conditions and fibrosis of skin: Secondary | ICD-10-CM | POA: Diagnosis not present

## 2017-09-22 DIAGNOSIS — D485 Neoplasm of uncertain behavior of skin: Secondary | ICD-10-CM | POA: Diagnosis not present

## 2017-09-22 DIAGNOSIS — D2272 Melanocytic nevi of left lower limb, including hip: Secondary | ICD-10-CM | POA: Diagnosis not present

## 2017-09-22 DIAGNOSIS — D2239 Melanocytic nevi of other parts of face: Secondary | ICD-10-CM | POA: Diagnosis not present

## 2017-09-24 DIAGNOSIS — D485 Neoplasm of uncertain behavior of skin: Secondary | ICD-10-CM | POA: Diagnosis not present

## 2018-01-01 ENCOUNTER — Ambulatory Visit: Payer: BLUE CROSS/BLUE SHIELD | Admitting: Physician Assistant

## 2018-01-01 ENCOUNTER — Encounter: Payer: Self-pay | Admitting: Physician Assistant

## 2018-01-01 ENCOUNTER — Other Ambulatory Visit: Payer: Self-pay

## 2018-01-01 VITALS — BP 133/90 | HR 100 | Resp 16 | Ht 64.17 in | Wt 192.4 lb

## 2018-01-01 DIAGNOSIS — R1012 Left upper quadrant pain: Secondary | ICD-10-CM | POA: Diagnosis not present

## 2018-01-01 LAB — POCT URINALYSIS DIP (MANUAL ENTRY)
BILIRUBIN UA: NEGATIVE
Blood, UA: NEGATIVE
GLUCOSE UA: NEGATIVE mg/dL
Ketones, POC UA: NEGATIVE mg/dL
LEUKOCYTES UA: NEGATIVE
NITRITE UA: NEGATIVE
PH UA: 6.5 (ref 5.0–8.0)
Protein Ur, POC: NEGATIVE mg/dL
Spec Grav, UA: 1.015 (ref 1.010–1.025)
Urobilinogen, UA: 0.2 E.U./dL

## 2018-01-01 LAB — POCT CBC
Granulocyte percent: 68.9 %G (ref 37–80)
HEMATOCRIT: 41.6 % (ref 37.7–47.9)
HEMOGLOBIN: 13.8 g/dL (ref 12.2–16.2)
LYMPH, POC: 2.6 (ref 0.6–3.4)
MCH, POC: 28.8 pg (ref 27–31.2)
MCHC: 33.3 g/dL (ref 31.8–35.4)
MCV: 86.7 fL (ref 80–97)
MID (CBC): 0.3 (ref 0–0.9)
MPV: 7 fL (ref 0–99.8)
POC GRANULOCYTE: 6.3 (ref 2–6.9)
POC LYMPH %: 28.3 % (ref 10–50)
POC MID %: 2.8 % (ref 0–12)
Platelet Count, POC: 348 10*3/uL (ref 142–424)
RBC: 4.8 M/uL (ref 4.04–5.48)
RDW, POC: 13 %
WBC: 9.2 10*3/uL (ref 4.6–10.2)

## 2018-01-01 LAB — POCT URINE PREGNANCY: Preg Test, Ur: NEGATIVE

## 2018-01-01 NOTE — Patient Instructions (Signed)
For pain and sinus congestion take Tylenol 1000 mg every 8 hours for now pseudoephedrine will probably also be justified.  Given that you are having left upper abdominal quadrant pain I want to hold off on the NSAIDs such as ibuprofen, Aleve, aspirin.  I have some test running I will contact you as soon as I have any new significant information.  Your vital signs today look great and your blood work thus far is completely normal.

## 2018-01-01 NOTE — Progress Notes (Signed)
01/01/2018 3:30 PM   DOB: [birth year] / MRN: [id number]  SUBJECTIVE:  Daisy Meyers is a 22 y.o. female presenting for cough and nasal congestion.  States that the symptoms began about 2 weeks ago and on day 8 she began to feel better for about 2 days and then suddenly became worse again.  She associates sore throat at the onset.  Has tried several over-the-counter medications without relief.  She complains of crampy left upper abdominal quadrant pain that started about 3 days ago and seems to be worsening.  States that she has had nausea with this is well.  She does take birth control and her last menstrual period ended about a week ago.  Tells me that she takes placebo pills and started back on her p.o. dosing about 4 days ago.  She is moving her bowels normally for her and had a normal nonbloody bowel movement today.  She is in college and has not had any mono exposures that she knows of.  She denies emesis..  Tells me she has not been exposed to anyone with mono that she knows of.  She does feel that she is getting worse.  She is allergic to no known allergies.   She  has no past medical history on file.    She  reports that  has never smoked. she has never used smokeless tobacco. She reports that she does not drink alcohol or use drugs. She  reports that she does not engage in sexual activity. The patient  has a past surgical history that includes Cyst excision; Wisdom tooth extraction; and Foreign Body Removal (Left, 08/15/2016).  Her family history is not on file.  Review of Systems  Constitutional: Negative for chills, diaphoresis and fever.  HENT: Positive for congestion and sinus pain. Negative for ear discharge, ear pain, hearing loss and tinnitus.   Respiratory: Negative for cough and wheezing.   Cardiovascular: Negative for chest pain.  Gastrointestinal: Positive for abdominal pain and nausea. Negative for blood in stool, constipation, diarrhea, heartburn, melena and vomiting.    Skin: Negative for itching and rash.  Neurological: Negative for dizziness and headaches.    The problem list and medications were reviewed and updated by myself where necessary and exist elsewhere in the encounter.   OBJECTIVE:  BP 133/90 (BP Location: Left Arm, Patient Position: Sitting, Cuff Size: Normal)   Pulse 100   Resp 16   Ht 5' 4.17" (1.63 m)   Wt 192 lb 6.4 oz (87.3 kg)   LMP 12/21/2017   SpO2 100%   BMI 32.85 kg/m   Wt Readings from Last 3 Encounters:  01/01/18 192 lb 6.4 oz (87.3 kg)  05/29/17 181 lb (82.1 kg)  03/03/17 185 lb (83.9 kg)   Temp Readings from Last 3 Encounters:  05/29/17 98.2 F (36.8 C) (Oral)  03/03/17 98.1 F (36.7 C) (Oral)  08/15/16 97.6 F (36.4 C)   BP Readings from Last 3 Encounters:  01/01/18 133/90  05/29/17 120/80  03/03/17 104/72   Pulse Readings from Last 3 Encounters:  01/01/18 100  05/29/17 84  03/03/17 73    Physical Exam  Constitutional: She is oriented to person, place, and time. She is active.  Non-toxic appearance.  She is well-appearing in no acute distress.  HENT:  Right Ear: Hearing, tympanic membrane, external ear and ear canal normal.  Left Ear: Hearing, tympanic membrane, external ear and ear canal normal.  Nose: Nose normal. Right sinus exhibits no maxillary sinus tenderness  and no frontal sinus tenderness. Left sinus exhibits no maxillary sinus tenderness and no frontal sinus tenderness.  Mouth/Throat: Uvula is midline, oropharynx is clear and moist and mucous membranes are normal. Mucous membranes are not dry. No oropharyngeal exudate, posterior oropharyngeal edema or tonsillar abscesses.  Cardiovascular: Normal rate, regular rhythm, S1 normal, S2 normal, normal heart sounds and intact distal pulses. Exam reveals no gallop, no friction rub and no decreased pulses.  No murmur heard. Pulmonary/Chest: Effort normal. No stridor. No tachypnea. No respiratory distress. She has no wheezes. She has no rales.   Abdominal: She exhibits no distension and no mass. There is tenderness (left upper quadrant). There is no rebound and no guarding.  Musculoskeletal: She exhibits no edema.  Lymphadenopathy:       Head (right side): No submandibular and no tonsillar adenopathy present.       Head (left side): No submandibular and no tonsillar adenopathy present.    She has no cervical adenopathy.  Neurological: She is alert and oriented to person, place, and time. She displays normal reflexes. No cranial nerve deficit. She exhibits normal muscle tone. Coordination normal.  Skin: Skin is warm and dry. She is not diaphoretic. No pallor.      Results for orders placed or performed in visit on 01/01/18 (from the past 72 hour(s))  POCT urinalysis dipstick     Status: None   Collection Time: 01/01/18  3:08 PM  Result Value Ref Range   Color, UA yellow yellow   Clarity, UA clear clear   Glucose, UA negative negative mg/dL   Bilirubin, UA negative negative   Ketones, POC UA negative negative mg/dL   Spec Grav, UA 1.610 9.604 - 1.025   Blood, UA negative negative   pH, UA 6.5 5.0 - 8.0   Protein Ur, POC negative negative mg/dL   Urobilinogen, UA 0.2 0.2 or 1.0 E.U./dL   Nitrite, UA Negative Negative   Leukocytes, UA Negative Negative  POCT urine pregnancy     Status: None   Collection Time: 01/01/18  3:08 PM  Result Value Ref Range   Preg Test, Ur Negative Negative  POCT CBC     Status: None   Collection Time: 01/01/18  3:24 PM  Result Value Ref Range   WBC 9.2 4.6 - 10.2 K/uL   Lymph, poc 2.6 0.6 - 3.4   POC LYMPH PERCENT 28.3 10 - 50 %L   MID (cbc) 0.3 0 - 0.9   POC MID % 2.8 0 - 12 %M   POC Granulocyte 6.3 2 - 6.9   Granulocyte percent 68.9 37 - 80 %G   RBC 4.80 4.04 - 5.48 M/uL   Hemoglobin 13.8 12.2 - 16.2 g/dL   HCT, POC 54.0 98.1 - 47.9 %   MCV 86.7 80 - 97 fL   MCH, POC 28.8 27 - 31.2 pg   MCHC 33.3 31.8 - 35.4 g/dL   RDW, POC 19.1 %   Platelet Count, POC 348 142 - 424 K/uL   MPV 7.0  0 - 99.8 fL    No results found.  ASSESSMENT AND PLAN:  Daisy Meyers was seen today for cough and nasal congestion.  Diagnoses and all orders for this visit:  Abdominal pain, left upper quadrant: Testing out.  I do not have any specific etiology today.  She is well-appearing with normal vital signs and I have advised that she take Tylenol while we wait for her lab test to come back. -     POCT  CBC -     POCT urinalysis dipstick -     POCT urine pregnancy -     Basic metabolic panel -     Hepatic Function Panel -     Lipase -     H. pylori breath test -     Epstein-Barr virus VCA, IgM    The patient is advised to call or return to clinic if she does not see an improvement in symptoms, or to seek the care of the closest emergency department if she worsens with the above plan.   Deliah BostonMichael Clark, MHS, PA-C [facility] 01/01/2018 3:30 PM

## 2018-01-02 LAB — BASIC METABOLIC PANEL
BUN/Creatinine Ratio: 8 — ABNORMAL LOW (ref 9–23)
BUN: 7 mg/dL (ref 6–20)
CALCIUM: 9.5 mg/dL (ref 8.7–10.2)
CO2: 20 mmol/L (ref 20–29)
CREATININE: 0.86 mg/dL (ref 0.57–1.00)
Chloride: 96 mmol/L (ref 96–106)
GFR calc Af Amer: 110 mL/min/{1.73_m2} (ref >59)
GFR, EST NON AFRICAN AMERICAN: 96 mL/min/{1.73_m2} (ref >59)
Glucose: 81 mg/dL (ref 65–99)
POTASSIUM: 4.3 mmol/L (ref 3.5–5.2)
Sodium: 139 mmol/L (ref 134–144)

## 2018-01-02 LAB — HEPATIC FUNCTION PANEL
ALBUMIN: 4.5 g/dL (ref 3.5–5.5)
ALT: 23 IU/L (ref 0–32)
AST: 18 IU/L (ref 0–40)
Alkaline Phosphatase: 99 IU/L (ref 39–117)
BILIRUBIN TOTAL: 0.2 mg/dL (ref 0.0–1.2)
Bilirubin, Direct: 0.07 mg/dL (ref 0.00–0.40)
Total Protein: 7.4 g/dL (ref 6.0–8.5)

## 2018-01-02 LAB — H. PYLORI BREATH TEST: H pylori Breath Test: NEGATIVE

## 2018-01-02 LAB — LIPASE: Lipase: 28 U/L (ref 14–72)

## 2018-01-02 LAB — EPSTEIN-BARR VIRUS VCA, IGM

## 2018-02-08 ENCOUNTER — Ambulatory Visit: Payer: BLUE CROSS/BLUE SHIELD | Admitting: Physician Assistant

## 2018-02-08 DIAGNOSIS — S62002A Unspecified fracture of navicular [scaphoid] bone of left wrist, initial encounter for closed fracture: Secondary | ICD-10-CM | POA: Diagnosis not present

## 2018-02-18 DIAGNOSIS — M79645 Pain in left finger(s): Secondary | ICD-10-CM | POA: Diagnosis not present

## 2018-03-02 IMAGING — RF DG C-ARM 61-120 MIN
1 series · 4 of 4 positions shown · non-contrast
Comparison: None.

FLUOROSCOPY TIME:  0 minutes 4 seconds; 4 acquired images

CLINICAL DATA: Foreign body removed from left foot

EXAM:
DG C-ARM 61-120 MIN; LEFT FOOT - COMPLETE 3+ VIEW

[Series 1: run · 4 of 4 slices shown]
[im 1/4]
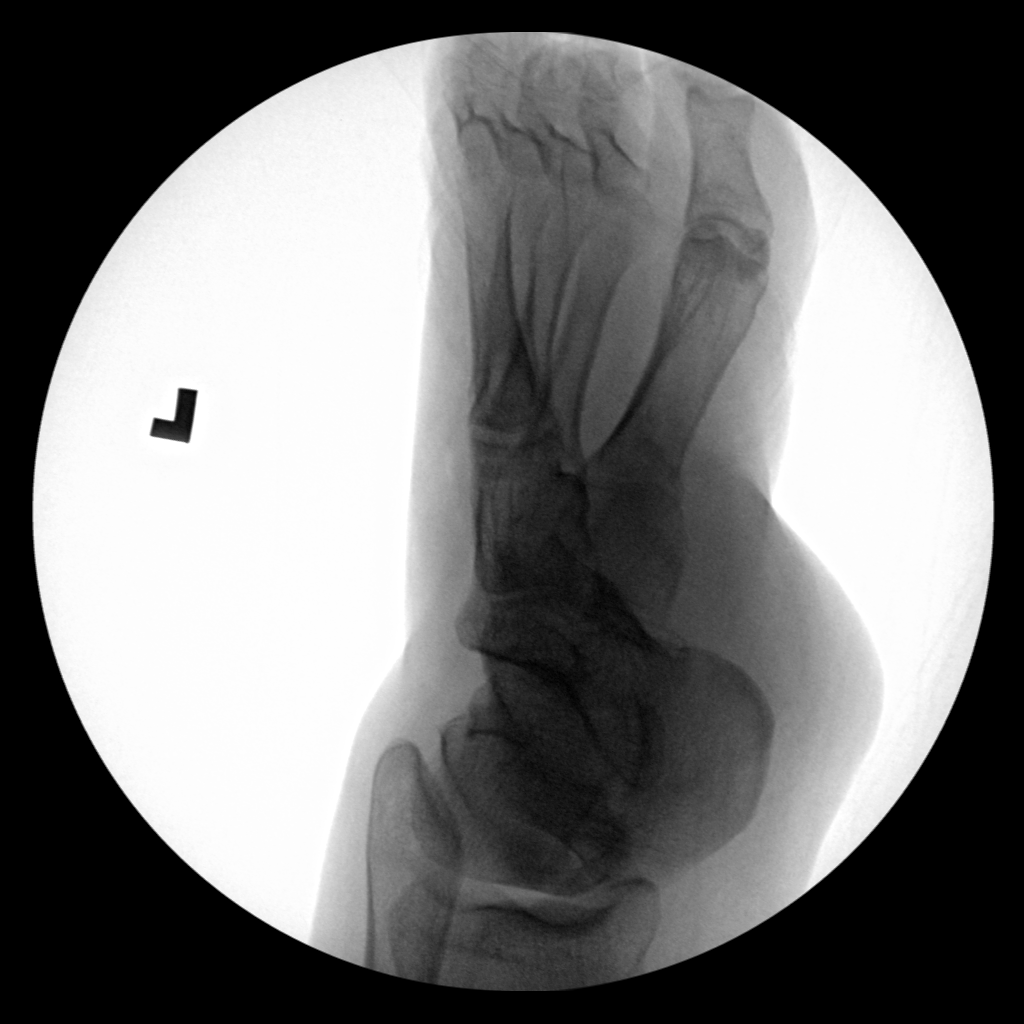
[im 2/4]
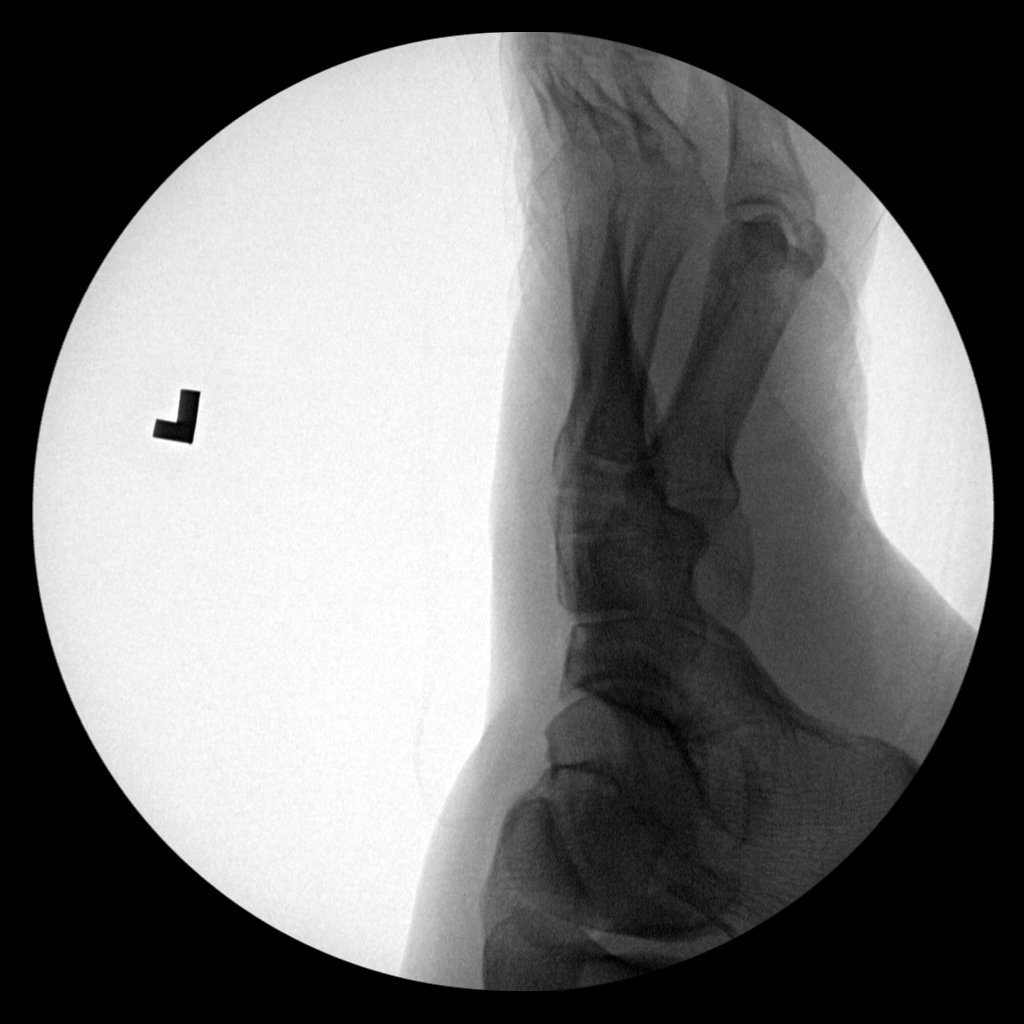
[im 3/4]
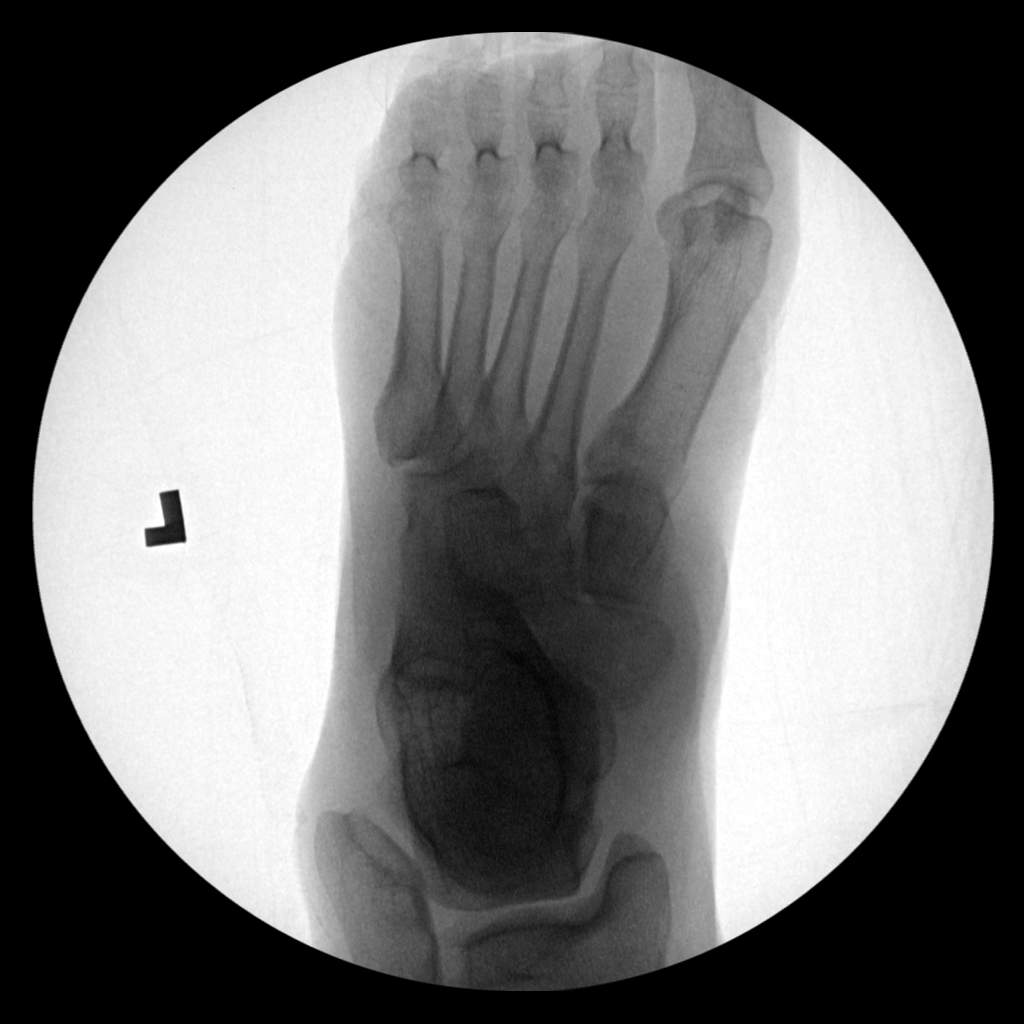
[im 4/4]
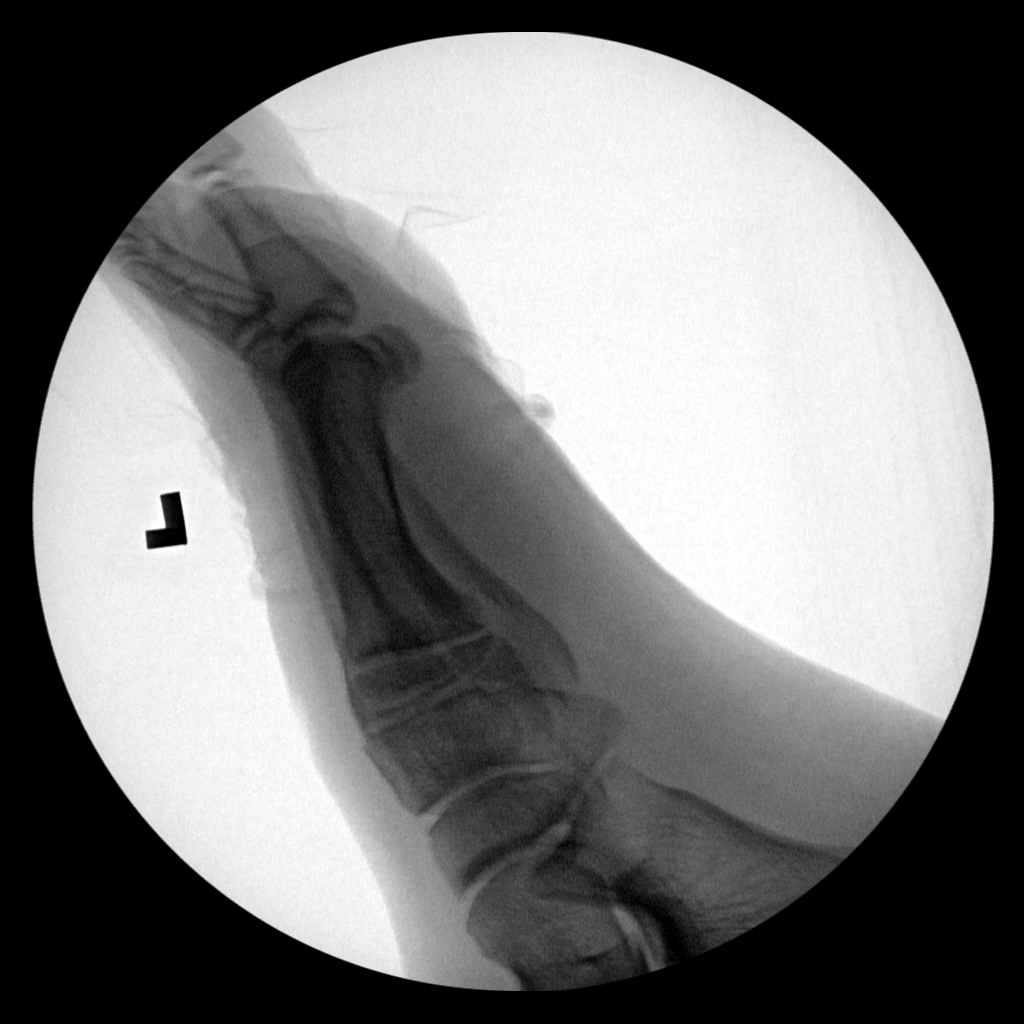

[4 of 4 positions shown; findings below may reference images not displayed]

FINDINGS: Frontal, oblique, and lateral views were obtained. No radiopaque
foreign body is evident. No fracture or dislocation. The joint
spaces appear unremarkable.
IMPRESSION: No demonstrable fracture or dislocation. No evident radiopaque
foreign body on submitted images.

## 2018-03-23 ENCOUNTER — Other Ambulatory Visit: Payer: Self-pay

## 2018-03-23 ENCOUNTER — Ambulatory Visit: Payer: BLUE CROSS/BLUE SHIELD | Admitting: Physician Assistant

## 2018-03-23 VITALS — BP 108/64 | HR 72 | Temp 98.0°F | Ht 65.0 in | Wt 179.8 lb

## 2018-03-23 DIAGNOSIS — R599 Enlarged lymph nodes, unspecified: Secondary | ICD-10-CM | POA: Diagnosis not present

## 2018-03-23 NOTE — Progress Notes (Signed)
   Daisy Meyers  MRN: [id number] DOB: April 12, 1996  Subjective:  Daisy AlaminStacey Meyers is a 22 y.o. female seen in office today for a chief complaint of bump on the back of her neck x years.  Is never given her any issues.  She typically rubs it while she is studying.  For the past week, it has been tender to the touch and larger in size.  Denies redness, warmth, purulent drainage, fever, chills, or recent illness.  Has not tried anything for relief.   Review of Systems  Constitutional: Negative for chills, diaphoresis, fatigue and unexpected weight change.  HENT: Negative for congestion, ear pain, sinus pain and sore throat.   Eyes: Negative for visual disturbance.  Respiratory: Negative for cough.     There are no active problems to display for this patient.   Current Outpatient Medications on File Prior to Visit  Medication Sig Dispense Refill  . buPROPion (WELLBUTRIN XL) 300 MG 24 hr tablet TAKE 1 TABLET BY MOUTH EVERY DAY IN THE MORNING  2  . Multiple Vitamin (MULTIVITAMIN WITH MINERALS) TABS tablet Take 1 tablet by mouth daily.     . TRI-PREVIFEM 0.18/0.215/0.25 MG-35 MCG tablet Take 1 tablet by mouth daily.  4   No current facility-administered medications on file prior to visit.     Allergies  Allergen Reactions  . No Known Allergies      Objective:  BP 108/64 (BP Location: Left Arm, Patient Position: Sitting, Cuff Size: Normal)   Pulse (!) 128   Temp 98 F (36.7 C) (Oral)   Ht 5\' 5"  (1.651 m)   Wt 179 lb 12.8 oz (81.6 kg)   LMP 02/06/2018   SpO2 98%   BMI 29.92 kg/m   Physical Exam  Constitutional: She is oriented to person, place, and time. She appears well-developed and well-nourished.  HENT:  Head: Normocephalic and atraumatic.  Eyes: Conjunctivae are normal.  Neck: Normal range of motion.  Pulmonary/Chest: Effort normal.  Lymphadenopathy:       Head (right side): Occipital (~1.5 palpable occipital lymph node, mildly tender with palpation. No overlying redness or  warmth. No fluctuance or induraiton. No overlying central punctum noted. ) adenopathy present. No submental, no submandibular, no tonsillar, no preauricular and no posterior auricular adenopathy present.       Head (left side): No submental, no submandibular, no tonsillar, no preauricular, no posterior auricular and no occipital adenopathy present.    She has no cervical adenopathy.       Right: No supraclavicular adenopathy present.       Left: No supraclavicular adenopathy present.  Neurological: She is alert and oriented to person, place, and time.  Skin: Skin is warm and dry.  Psychiatric: She has a normal mood and affect.  Vitals reviewed.   Assessment and Plan :  1. Enlarged lymph nodes History and physical exam findings consistent with enlarged occipital lymph node.  No acute findings noted on exam.  Patient reassured.  This is likely reactive process.  Advised to return to clinic if symptoms worsen, do not improve, or as needed.   Benjiman CoreBrittany Diedre Maclellan PA-C  [facility]  [facility] 03/23/2018 5:41 PM

## 2018-03-23 NOTE — Patient Instructions (Addendum)
This is consistent with an enlarged lymph node. It should resolve and go back to its normal size over the next month. If it does not, please return to office for further evaluation. Thank you for letting me participate in your health and well being.   IF you received an x-ray today, you will receive an invoice from [facility]. Please contact [facility] at [phone] with questions or concerns regarding your invoice.   IF you received labwork today, you will receive an invoice from [facility]. Please contact [facility] at [phone] with questions or concerns regarding your invoice.   Our billing staff will not be able to assist you with questions regarding bills from these companies.  You will be contacted with the lab results as soon as they are available. The fastest way to get your results is to activate your My Chart account. Instructions are located on the last page of this paperwork. If you have not heard from us regarding the results in 2 weeks, please contact this office.

## 2018-03-26 ENCOUNTER — Encounter: Payer: Self-pay | Admitting: Physician Assistant

## 2018-05-08 DIAGNOSIS — Z111 Encounter for screening for respiratory tuberculosis: Secondary | ICD-10-CM | POA: Diagnosis not present

## 2018-09-08 DIAGNOSIS — R110 Nausea: Secondary | ICD-10-CM | POA: Diagnosis not present

## 2018-09-08 DIAGNOSIS — R1031 Right lower quadrant pain: Secondary | ICD-10-CM | POA: Diagnosis not present

## 2018-09-08 DIAGNOSIS — R509 Fever, unspecified: Secondary | ICD-10-CM | POA: Diagnosis not present

## 2018-09-15 DIAGNOSIS — Z23 Encounter for immunization: Secondary | ICD-10-CM | POA: Diagnosis not present

## 2018-09-15 DIAGNOSIS — R5383 Other fatigue: Secondary | ICD-10-CM | POA: Diagnosis not present

## 2018-09-15 DIAGNOSIS — F329 Major depressive disorder, single episode, unspecified: Secondary | ICD-10-CM | POA: Diagnosis not present
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
## 2018-12-16 DIAGNOSIS — L814 Other melanin hyperpigmentation: Secondary | ICD-10-CM | POA: Diagnosis not present

## 2018-12-16 DIAGNOSIS — D225 Melanocytic nevi of trunk: Secondary | ICD-10-CM | POA: Diagnosis not present

## 2019-01-03 DIAGNOSIS — Z124 Encounter for screening for malignant neoplasm of cervix: Secondary | ICD-10-CM | POA: Diagnosis not present

## 2019-01-03 DIAGNOSIS — N72 Inflammatory disease of cervix uteri: Secondary | ICD-10-CM | POA: Diagnosis not present

## 2019-01-03 DIAGNOSIS — Z01419 Encounter for gynecological examination (general) (routine) without abnormal findings: Secondary | ICD-10-CM | POA: Diagnosis not present

## 2019-01-14 DIAGNOSIS — Z23 Encounter for immunization: Secondary | ICD-10-CM | POA: Diagnosis not present

## 2019-07-03 DIAGNOSIS — R300 Dysuria: Secondary | ICD-10-CM | POA: Diagnosis not present

## 2019-07-31 DIAGNOSIS — Z20828 Contact with and (suspected) exposure to other viral communicable diseases: Secondary | ICD-10-CM | POA: Diagnosis not present

## 2019-09-13 ENCOUNTER — Other Ambulatory Visit: Payer: Self-pay

## 2019-09-13 ENCOUNTER — Encounter: Payer: Self-pay | Admitting: Adult Health Nurse Practitioner

## 2019-09-13 ENCOUNTER — Ambulatory Visit: Payer: BLUE CROSS/BLUE SHIELD | Admitting: Adult Health Nurse Practitioner

## 2019-09-13 VITALS — BP 129/77 | HR 78 | Temp 98.7°F | Wt 209.2 lb

## 2019-09-13 DIAGNOSIS — Z0000 Encounter for general adult medical examination without abnormal findings: Secondary | ICD-10-CM | POA: Diagnosis not present

## 2019-09-13 DIAGNOSIS — Z23 Encounter for immunization: Secondary | ICD-10-CM

## 2019-09-13 NOTE — Progress Notes (Signed)
Chief Complaint  Patient presents with  . Establish Care    Establisjh care and to get flu shot and Hep b titers and other question about immunization    HPI   College Physical Exam: Patient presents for presents for grad school  physical exam. She denies any current medical problems or concerns. Questionnaire and forms reviewed with patient and responses verified. Immunizations are up to date. Patient has received 2 doses of MMR vaccine. Varicella immunity:  confirmed.   Review of Systems  Constitutional: Negative for activity change, appetite change, chills and fever.  HENT: Negative for congestion, nosebleeds, trouble swallowing and voice change.   Respiratory: Negative for cough, shortness of breath and wheezing.   Gastrointestinal: Negative for diarrhea, nausea and vomiting.  Genitourinary: Negative for difficulty urinating, dysuria, flank pain and hematuria.  Musculoskeletal: Negative for back pain, joint swelling and neck pain.  Neurological: Negative for dizziness, speech difficulty, light-headedness and numbness.  See HPI. All other review of systems negative.    Past Medical History:  Diagnosis Date  . Depression     Current Outpatient Medications  Medication Sig Dispense Refill  . Multiple Vitamin (MULTIVITAMIN WITH MINERALS) TABS tablet Take 1 tablet by mouth daily.     . TRI-PREVIFEM 0.18/0.215/0.25 MG-35 MCG tablet Take 1 tablet by mouth daily.  4   No current facility-administered medications for this visit.     Allergies:  Allergies  Allergen Reactions  . No Known Allergies     Past Surgical History:  Procedure Laterality Date  . CYST EXCISION     pilonidinal  . FOOT FOREIGN BODY REMOVAL  08/2016  . FOREIGN BODY REMOVAL Left 08/15/2016   Procedure: REMOVAL FOREIGN BODY LEFT FOOT;  Surgeon: Altamese Chama, MD;  Location: [facility];  Service: Orthopedics;  Laterality: Left;  . WISDOM TOOTH EXTRACTION      Social History   Socioeconomic History  .  Marital status: Single    Spouse name: Not on file  . Number of children: Not on file  . Years of education: Not on file  . Highest education level: Not on file  Occupational History  . Not on file  Social Needs  . Financial resource strain: Not on file  . Food insecurity    Worry: Not on file    Inability: Not on file  . Transportation needs    Medical: Not on file    Non-medical: Not on file  Tobacco Use  . Smoking status: Never Smoker  . Smokeless tobacco: Never Used  Substance and Sexual Activity  . Alcohol use: No  . Drug use: No  . Sexual activity: Never  Lifestyle  . Physical activity    Days per week: Not on file    Minutes per session: Not on file  . Stress: Not on file  Relationships  . Social Herbalist on phone: Not on file    Gets together: Not on file    Attends religious service: Not on file    Active member of club or organization: Not on file    Attends meetings of clubs or organizations: Not on file    Relationship status: Not on file  Other Topics Concern  . Not on file  Social History Narrative  . Not on file    No family history on file.   ROS Review of Systems See HPI Constitution: No fevers or chills No malaise No diaphoresis Skin: No rash or itching Eyes: no blurry vision, no  double vision GU: no dysuria or hematuria Neuro: no dizziness or headaches all others reviewed and negative   Objective: Vitals:   09/13/19 1318  BP: 129/77  Pulse: 78  Temp: 98.7 F (37.1 C)  TempSrc: Oral  SpO2: 98%  Weight: 209 lb 3.2 oz (94.9 kg)    Physical Exam    GEN: WDWN, NAD, Non-toxic, Alert & Oriented x 3 HEENT: Atraumatic, Normocephalic.  Ears and Nose: No external deformity. EXTR: No clubbing/cyanosis/edema NEURO: Normal gait.  PSYCH: Normally interactive. Conversant. Not depressed or anxious appearing.  Calm demeanor.    Assessment and Plan Daisy Meyers was seen today for establish care.  Diagnoses and all orders for this  visit:  Need for prophylactic vaccination and inoculation against influenza -     Cancel: Flu Vaccine QUAD High Dose(Fluad) -     Flu Vaccine QUAD 6+ mos PF IM (Fluarix Quad PF)  All immunizations were verified through the [place] Database.  Hep B titers ordered today.  Flu shot given.  Patient has established care and educated on f/u appointments in the future.  She is inline with this plan.    Glyn Ade

## 2019-09-13 NOTE — Patient Instructions (Signed)
° ° ° °  If you have lab work done today you will be contacted with your lab results within the next 2 weeks.  If you have not heard from us then please contact us. The fastest way to get your results is to register for My Chart. ° ° °IF you received an x-ray today, you will receive an invoice from [facility]. Please contact [facility] at [phone] with questions or concerns regarding your invoice.  ° °IF you received labwork today, you will receive an invoice from [facility]. Please contact [facility] at [phone] with questions or concerns regarding your invoice.  ° °Our billing staff will not be able to assist you with questions regarding bills from these companies. ° °You will be contacted with the lab results as soon as they are available. The fastest way to get your results is to activate your My Chart account. Instructions are located on the last page of this paperwork. If you have not heard from us regarding the results in 2 weeks, please contact this office. °  ° ° ° °

## 2019-09-14 LAB — HEPATITIS B SURFACE ANTIBODY, QUANTITATIVE: Hepatitis B Surf Ab Quant: 7.5 m[IU]/mL — ABNORMAL LOW (ref >9.9)

## 2019-09-16 NOTE — Progress Notes (Signed)
Daisy Meyers,  You are not showing immunity to Hep B.  I would recommend restarting a Hep B series.  You can make a nurse visit to start the series and they will let you know when to come back for shots 2 and 3.  Let me know if you have any questions.

## 2019-09-19 ENCOUNTER — Other Ambulatory Visit: Payer: Self-pay

## 2019-09-19 ENCOUNTER — Ambulatory Visit (INDEPENDENT_AMBULATORY_CARE_PROVIDER_SITE_OTHER): Payer: BC Managed Care – PPO | Admitting: Adult Health Nurse Practitioner

## 2019-09-19 DIAGNOSIS — Z23 Encounter for immunization: Secondary | ICD-10-CM

## 2019-09-19 MED ORDER — HEPATITIS B VAC RECOMBINANT 10 MCG/ML IJ SUSP: 1.0000 mL | Freq: Once | INTRAMUSCULAR | Status: DC

## 2019-10-05 ENCOUNTER — Ambulatory Visit (INDEPENDENT_AMBULATORY_CARE_PROVIDER_SITE_OTHER): Payer: BC Managed Care – PPO | Admitting: Adult Health Nurse Practitioner

## 2019-10-05 DIAGNOSIS — Z111 Encounter for screening for respiratory tuberculosis: Secondary | ICD-10-CM | POA: Diagnosis not present

## 2019-10-07 LAB — QUANTIFERON-TB GOLD PLUS
QuantiFERON Mitogen Value: >10 IU/mL
QuantiFERON Nil Value: 0.02 IU/mL
QuantiFERON TB1 Ag Value: 0.02 IU/mL
QuantiFERON TB2 Ag Value: 0.02 IU/mL
QuantiFERON-TB Gold Plus: NEGATIVE

## 2019-10-14 ENCOUNTER — Encounter: Payer: Self-pay | Admitting: Adult Health Nurse Practitioner

## 2019-10-17 ENCOUNTER — Telehealth: Payer: Self-pay | Admitting: Adult Health Nurse Practitioner

## 2019-10-17 NOTE — Telephone Encounter (Signed)
Pt called in concerning her latest QuantiFERON-TB Gold Plus test . She would like her results. Please advise at [phone].

## 2019-10-18 NOTE — Telephone Encounter (Signed)
Spoke with pt and informed her of results, she verbalized understanding.

## 2019-10-20 ENCOUNTER — Ambulatory Visit (INDEPENDENT_AMBULATORY_CARE_PROVIDER_SITE_OTHER): Payer: BC Managed Care – PPO | Admitting: Adult Health Nurse Practitioner

## 2019-10-20 ENCOUNTER — Other Ambulatory Visit: Payer: Self-pay

## 2019-10-20 DIAGNOSIS — Z23 Encounter for immunization: Secondary | ICD-10-CM

## 2019-11-24 DIAGNOSIS — D2261 Melanocytic nevi of right upper limb, including shoulder: Secondary | ICD-10-CM | POA: Diagnosis not present

## 2019-11-24 DIAGNOSIS — D239 Other benign neoplasm of skin, unspecified: Secondary | ICD-10-CM | POA: Diagnosis not present

## 2019-12-01 ENCOUNTER — Other Ambulatory Visit: Payer: Self-pay

## 2019-12-01 ENCOUNTER — Ambulatory Visit: Payer: BC Managed Care – PPO

## 2019-12-01 DIAGNOSIS — Z1159 Encounter for screening for other viral diseases: Secondary | ICD-10-CM | POA: Diagnosis not present

## 2019-12-01 DIAGNOSIS — B1910 Unspecified viral hepatitis B without hepatic coma: Secondary | ICD-10-CM

## 2019-12-02 LAB — HEPATITIS B CORE ANTIBODY, TOTAL: Hep B Core Total Ab: NEGATIVE

## 2019-12-19 ENCOUNTER — Other Ambulatory Visit: Payer: Self-pay

## 2019-12-19 ENCOUNTER — Ambulatory Visit: Payer: BC Managed Care – PPO

## 2019-12-19 DIAGNOSIS — Z1159 Encounter for screening for other viral diseases: Secondary | ICD-10-CM

## 2019-12-20 LAB — HEPATITIS B SURFACE ANTIBODY, QUANTITATIVE: Hepatitis B Surf Ab Quant: >1000 m[IU]/mL (ref >9.9)

## 2020-01-01 ENCOUNTER — Other Ambulatory Visit: Payer: Self-pay

## 2020-01-01 ENCOUNTER — Ambulatory Visit (HOSPITAL_COMMUNITY)
Admission: EM | Admit: 2020-01-01 | Discharge: 2020-01-01 | Disposition: A | Discharge status: Home / Self Care | Payer: BLUE CROSS/BLUE SHIELD | Attending: Urgent Care | Admitting: Urgent Care

## 2020-01-01 ENCOUNTER — Encounter (HOSPITAL_COMMUNITY): Payer: Self-pay

## 2020-01-01 DIAGNOSIS — H9201 Otalgia, right ear: Secondary | ICD-10-CM

## 2020-01-01 DIAGNOSIS — H6981 Other specified disorders of Eustachian tube, right ear: Secondary | ICD-10-CM

## 2020-01-01 MED ORDER — PSEUDOEPHEDRINE HCL 60 MG PO TABS: 60.0000 mg | ORAL_TABLET | Freq: Three times a day (TID) | ORAL | 0 refills | Status: DC | PRN

## 2020-01-01 MED ORDER — FLUTICASONE PROPIONATE 50 MCG/ACT NA SUSP: 2.0000 | Freq: Every day | NASAL | 0 refills | Status: DC

## 2020-01-01 NOTE — ED Triage Notes (Signed)
Pt states she has right ear discomfort this atrted on Thursday. Pt states she noticed it when she's eating or chewing.

## 2020-01-01 NOTE — ED Provider Notes (Signed)
[place]   MRN: [id number] DOB: [birth year]  Subjective:   Daisy Meyers is a 24 y.o. female presenting for 3 day hx of acute onset intermittent transient right ear discomfort. Notices it more when she eats or chews. Has not tried medications for relief. Denies direct COVID 19 contacts/exposure.    No current facility-administered medications for this encounter.  Current Outpatient Medications:  Marland Kitchen  Multiple Vitamin (MULTIVITAMIN WITH MINERALS) TABS tablet, Take 1 tablet by mouth daily. , Disp: , Rfl:  .  TRI-PREVIFEM 0.18/0.215/0.25 MG-35 MCG tablet, Take 1 tablet by mouth daily., Disp: , Rfl: 4   Allergies  Allergen Reactions  . No Known Allergies     Past Medical History:  Diagnosis Date  . Depression      Past Surgical History:  Procedure Laterality Date  . CYST EXCISION     pilonidinal  . FOOT FOREIGN BODY REMOVAL  08/2016  . FOREIGN BODY REMOVAL Left 08/15/2016   Procedure: REMOVAL FOREIGN BODY LEFT FOOT;  Surgeon: Altamese Grey Eagle, MD;  Location: [facility];  Service: Orthopedics;  Laterality: Left;  . WISDOM TOOTH EXTRACTION      History reviewed. No pertinent family history.  Social History   Tobacco Use  . Smoking status: Never Smoker  . Smokeless tobacco: Never Used  Substance Use Topics  . Alcohol use: No  . Drug use: No    ROS No dizziness, ear drainage, decreased hearing, tinnitus.  Objective:   Vitals: BP 119/71 (BP Location: Right Arm)   Pulse (!) 116   Temp 98.4 F (36.9 C) (Oral)   Resp 18   Wt 195 lb (88.5 kg)   LMP 12/27/2019   SpO2 100%   BMI 32.45 kg/m   Recheck pulse was 94.   Physical Exam Constitutional:      General: She is not in acute distress.    Appearance: She is well-developed. She is not ill-appearing, toxic-appearing or diaphoretic.  HENT:     Head: Normocephalic and atraumatic.     Comments: No TMJ dysfunction or pain.    Right Ear: Tympanic membrane, ear canal and external ear normal. No drainage or  tenderness. No middle ear effusion. There is no impacted cerumen. Tympanic membrane is not erythematous.     Left Ear: Tympanic membrane, ear canal and external ear normal. No drainage or tenderness.  No middle ear effusion. There is no impacted cerumen. Tympanic membrane is not erythematous.     Nose: No congestion or rhinorrhea.     Mouth/Throat:     Mouth: Mucous membranes are moist. No oral lesions.     Pharynx: Oropharynx is clear. No pharyngeal swelling, oropharyngeal exudate, posterior oropharyngeal erythema or uvula swelling.     Tonsils: No tonsillar exudate or tonsillar abscesses.  Eyes:     Extraocular Movements:     Right eye: Normal extraocular motion.     Left eye: Normal extraocular motion.     Conjunctiva/sclera: Conjunctivae normal.     Pupils: Pupils are equal, round, and reactive to light.  Cardiovascular:     Rate and Rhythm: Normal rate.  Pulmonary:     Effort: Pulmonary effort is normal.  Musculoskeletal:     Cervical back: Normal range of motion and neck supple.  Lymphadenopathy:     Cervical: No cervical adenopathy.  Skin:    General: Skin is warm and dry.  Neurological:     General: No focal deficit present.     Mental Status: She is alert and oriented to  person, place, and time.  Psychiatric:        Mood and Affect: Mood normal.        Behavior: Behavior normal.      Assessment and Plan :   1. Eustachian tube dysfunction, right   2. Otalgia of right ear     Discussed differential. Exam findings reassuring against OM, HPI also not consistent with this. Will use Flonase, Sudafed to help with suspected ETD. Counseled on possibility of TMJ although PE findings do not suggest this. Counseled patient on potential for adverse effects with medications prescribed/recommended today, ER and return-to-clinic precautions discussed, patient verbalized understanding.    Wallis Bamberg, [place] 01/04/20 [phone]

## 2020-03-10 DIAGNOSIS — Z20828 Contact with and (suspected) exposure to other viral communicable diseases: Secondary | ICD-10-CM | POA: Diagnosis not present

## 2020-03-10 DIAGNOSIS — Z03818 Encounter for observation for suspected exposure to other biological agents ruled out: Secondary | ICD-10-CM | POA: Diagnosis not present

## 2020-03-15 DIAGNOSIS — Z20828 Contact with and (suspected) exposure to other viral communicable diseases: Secondary | ICD-10-CM | POA: Diagnosis not present

## 2020-03-21 DIAGNOSIS — Z20828 Contact with and (suspected) exposure to other viral communicable diseases: Secondary | ICD-10-CM | POA: Diagnosis not present

## 2020-05-25 ENCOUNTER — Other Ambulatory Visit: Payer: Self-pay

## 2020-05-25 ENCOUNTER — Encounter: Payer: Self-pay | Admitting: Registered Nurse

## 2020-05-25 ENCOUNTER — Ambulatory Visit: Payer: BC Managed Care – PPO | Admitting: Registered Nurse

## 2020-05-25 VITALS — BP 118/83 | HR 111 | Temp 97.3°F | Resp 17 | Ht 65.0 in | Wt 219.2 lb

## 2020-05-25 DIAGNOSIS — M542 Cervicalgia: Secondary | ICD-10-CM | POA: Diagnosis not present

## 2020-05-25 DIAGNOSIS — R635 Abnormal weight gain: Secondary | ICD-10-CM

## 2020-05-25 NOTE — Patient Instructions (Signed)
° ° ° °  If you have lab work done today you will be contacted with your lab results within the next 2 weeks.  If you have not heard from us then please contact us. The fastest way to get your results is to register for My Chart. ° ° °IF you received an x-ray today, you will receive an invoice from [facility]. Please contact [facility] at [phone] with questions or concerns regarding your invoice.  ° °IF you received labwork today, you will receive an invoice from [facility]. Please contact [facility] at [phone] with questions or concerns regarding your invoice.  ° °Our billing staff will not be able to assist you with questions regarding bills from these companies. ° °You will be contacted with the lab results as soon as they are available. The fastest way to get your results is to activate your My Chart account. Instructions are located on the last page of this paperwork. If you have not heard from us regarding the results in 2 weeks, please contact this office. °  ° ° ° °

## 2020-05-26 LAB — COMPREHENSIVE METABOLIC PANEL
ALT: 32 IU/L (ref 0–32)
AST: 26 IU/L (ref 0–40)
Albumin/Globulin Ratio: 1.6 (ref 1.2–2.2)
Albumin: 4.5 g/dL (ref 3.9–5.0)
Alkaline Phosphatase: 107 IU/L (ref 48–121)
BUN/Creatinine Ratio: 9 (ref 9–23)
BUN: 8 mg/dL (ref 6–20)
Bilirubin Total: 0.4 mg/dL (ref 0.0–1.2)
CO2: 22 mmol/L (ref 20–29)
Calcium: 9.8 mg/dL (ref 8.7–10.2)
Chloride: 98 mmol/L (ref 96–106)
Creatinine, Ser: 0.91 mg/dL (ref 0.57–1.00)
GFR calc Af Amer: 101 mL/min/{1.73_m2} (ref >59)
GFR calc non Af Amer: 88 mL/min/{1.73_m2} (ref >59)
Globulin, Total: 2.8 g/dL (ref 1.5–4.5)
Glucose: 110 mg/dL — ABNORMAL HIGH (ref 65–99)
Potassium: 4.1 mmol/L (ref 3.5–5.2)
Sodium: 135 mmol/L (ref 134–144)
Total Protein: 7.3 g/dL (ref 6.0–8.5)

## 2020-05-26 LAB — THYROID PANEL WITH TSH
Free Thyroxine Index: 1.9 (ref 1.2–4.9)
T3 Uptake Ratio: 24 % (ref 24–39)
T4, Total: 8.1 ug/dL (ref 4.5–12.0)
TSH: 1.69 u[IU]/mL (ref 0.450–4.500)

## 2020-05-26 LAB — CBC WITH DIFFERENTIAL/PLATELET
Basophils Absolute: 0 10*3/uL (ref 0.0–0.2)
Basos: 0 %
EOS (ABSOLUTE): 0.1 10*3/uL (ref 0.0–0.4)
Eos: 2 %
Hematocrit: 38.1 % (ref 34.0–46.6)
Hemoglobin: 12.8 g/dL (ref 11.1–15.9)
Immature Grans (Abs): 0 10*3/uL (ref 0.0–0.1)
Immature Granulocytes: 0 %
Lymphocytes Absolute: 2.7 10*3/uL (ref 0.7–3.1)
Lymphs: 35 %
MCH: 28.8 pg (ref 26.6–33.0)
MCHC: 33.6 g/dL (ref 31.5–35.7)
MCV: 86 fL (ref 79–97)
Monocytes Absolute: 0.4 10*3/uL (ref 0.1–0.9)
Monocytes: 5 %
Neutrophils Absolute: 4.4 10*3/uL (ref 1.4–7.0)
Neutrophils: 58 %
Platelets: 268 10*3/uL (ref 150–450)
RBC: 4.44 x10E6/uL (ref 3.77–5.28)
RDW: 13.3 % (ref 11.7–15.4)
WBC: 7.6 10*3/uL (ref 3.4–10.8)

## 2020-05-26 LAB — LIPID PANEL
Chol/HDL Ratio: 2.6 ratio (ref 0.0–4.4)
Cholesterol, Total: 153 mg/dL (ref 100–199)
HDL: 60 mg/dL (ref >39)
LDL Chol Calc (NIH): 80 mg/dL (ref 0–99)
Triglycerides: 67 mg/dL (ref 0–149)
VLDL Cholesterol Cal: 13 mg/dL (ref 5–40)

## 2020-05-26 LAB — HEMOGLOBIN A1C
Est. average glucose Bld gHb Est-mCnc: 105 mg/dL
Hgb A1c MFr Bld: 5.3 % (ref 4.8–5.6)

## 2020-05-30 ENCOUNTER — Telehealth: Payer: Self-pay | Admitting: Adult Health Nurse Practitioner

## 2020-05-30 NOTE — Telephone Encounter (Signed)
Spoke to Rich NP, patient's labs need to be resulted because she called. Per Luan Pulling NP he message patient.

## 2020-05-30 NOTE — Telephone Encounter (Signed)
Pt Called states she need her lab Result please Advice

## 2020-06-05 ENCOUNTER — Telehealth: Payer: Self-pay

## 2020-06-05 NOTE — Telephone Encounter (Signed)
Pt requesting her lab results from 05/25/2020 I see they are complete but have no note. What should I tell the patient?  Copied from CRM [id number]. Topic: General - Other >> Jun 04, 2020 12:04 PM Jaquita Rector A wrote: Reason for CRM: Patient called to say that she have been waiting on a call in reference to her lab results asking for a call back at Ph# [phone] (

## 2020-06-06 NOTE — Telephone Encounter (Signed)
Sent message via MyChart.

## 2020-06-06 NOTE — Telephone Encounter (Signed)
Sent message via MyChart.  Jari Sportsman, NP

## 2020-06-06 NOTE — Telephone Encounter (Signed)
Pt would like a cb. She has read the message and her lab results. She now has a lump on her neck and she fells this has something to do with her lab results.. She would like to know what needs to happen going forward. Please advise [phone].

## 2020-06-26 ENCOUNTER — Ambulatory Visit: Payer: Self-pay

## 2020-06-28 DIAGNOSIS — S93602A Unspecified sprain of left foot, initial encounter: Secondary | ICD-10-CM | POA: Diagnosis not present

## 2020-06-29 DIAGNOSIS — L814 Other melanin hyperpigmentation: Secondary | ICD-10-CM | POA: Diagnosis not present

## 2020-06-29 DIAGNOSIS — D1801 Hemangioma of skin and subcutaneous tissue: Secondary | ICD-10-CM | POA: Diagnosis not present

## 2020-06-29 DIAGNOSIS — D2230 Melanocytic nevi of unspecified part of face: Secondary | ICD-10-CM | POA: Diagnosis not present

## 2020-06-29 DIAGNOSIS — L578 Other skin changes due to chronic exposure to nonionizing radiation: Secondary | ICD-10-CM | POA: Diagnosis not present

## 2020-08-01 DIAGNOSIS — Z6836 Body mass index (BMI) 36.0-36.9, adult: Secondary | ICD-10-CM | POA: Diagnosis not present

## 2020-08-01 DIAGNOSIS — Z01419 Encounter for gynecological examination (general) (routine) without abnormal findings: Secondary | ICD-10-CM | POA: Diagnosis not present

## 2020-08-01 DIAGNOSIS — Z1389 Encounter for screening for other disorder: Secondary | ICD-10-CM | POA: Diagnosis not present

## 2020-08-01 DIAGNOSIS — Z130 Encounter for screening for diseases of the blood and blood-forming organs and certain disorders involving the immune mechanism: Secondary | ICD-10-CM | POA: Diagnosis not present

## 2020-08-08 ENCOUNTER — Other Ambulatory Visit: Payer: Self-pay

## 2020-08-08 ENCOUNTER — Ambulatory Visit: Payer: BC Managed Care – PPO | Admitting: Registered Nurse

## 2020-08-08 ENCOUNTER — Encounter: Payer: Self-pay | Admitting: Registered Nurse

## 2020-08-08 VITALS — BP 118/77 | HR 81 | Temp 98.6°F | Ht 65.0 in | Wt 217.0 lb

## 2020-08-08 DIAGNOSIS — R590 Localized enlarged lymph nodes: Secondary | ICD-10-CM

## 2020-08-08 NOTE — Patient Instructions (Signed)
° ° ° °  If you have lab work done today you will be contacted with your lab results within the next 2 weeks.  If you have not heard from us then please contact us. The fastest way to get your results is to register for My Chart. ° ° °IF you received an x-ray today, you will receive an invoice from [facility]. Please contact [facility] at [phone] with questions or concerns regarding your invoice.  ° °IF you received labwork today, you will receive an invoice from [facility]. Please contact [facility] at [phone] with questions or concerns regarding your invoice.  ° °Our billing staff will not be able to assist you with questions regarding bills from these companies. ° °You will be contacted with the lab results as soon as they are available. The fastest way to get your results is to activate your My Chart account. Instructions are located on the last page of this paperwork. If you have not heard from us regarding the results in 2 weeks, please contact this office. °  ° ° ° °

## 2020-08-09 ENCOUNTER — Encounter: Payer: Self-pay | Admitting: Registered Nurse

## 2020-08-09 LAB — CBC WITH DIFFERENTIAL
Basophils Absolute: 0 10*3/uL (ref 0.0–0.2)
Basos: 0 %
EOS (ABSOLUTE): 0.1 10*3/uL (ref 0.0–0.4)
Eos: 2 %
Hematocrit: 41.4 % (ref 34.0–46.6)
Hemoglobin: 13.7 g/dL (ref 11.1–15.9)
Immature Grans (Abs): 0 10*3/uL (ref 0.0–0.1)
Immature Granulocytes: 0 %
Lymphocytes Absolute: 2.7 10*3/uL (ref 0.7–3.1)
Lymphs: 38 %
MCH: 28.6 pg (ref 26.6–33.0)
MCHC: 33.1 g/dL (ref 31.5–35.7)
MCV: 86 fL (ref 79–97)
Monocytes Absolute: 0.4 10*3/uL (ref 0.1–0.9)
Monocytes: 5 %
Neutrophils Absolute: 3.9 10*3/uL (ref 1.4–7.0)
Neutrophils: 55 %
RBC: 4.79 x10E6/uL (ref 3.77–5.28)
RDW: 13.3 % (ref 11.7–15.4)
WBC: 7.2 10*3/uL (ref 3.4–10.8)

## 2020-08-09 LAB — TSH: TSH: 1.69 u[IU]/mL (ref 0.450–4.500)

## 2020-08-09 LAB — MONONUCLEOSIS SCREEN: Mono Screen: NEGATIVE

## 2020-08-09 LAB — CMV ABS, IGG+IGM (CYTOMEGALOVIRUS)
CMV Ab - IgG: <0.6 U/mL (ref 0.00–0.59)
CMV IgM Ser EIA-aCnc: <30 AU/mL (ref 0.0–29.9)

## 2020-08-15 ENCOUNTER — Ambulatory Visit
Admission: RE | Admit: 2020-08-15 | Discharge: 2020-08-15 | Disposition: A | Discharge status: Home / Self Care | Payer: BC Managed Care – PPO | Source: Ambulatory Visit | Attending: Registered Nurse | Admitting: Registered Nurse

## 2020-08-15 DIAGNOSIS — R590 Localized enlarged lymph nodes: Secondary | ICD-10-CM

## 2020-08-22 DIAGNOSIS — Z20822 Contact with and (suspected) exposure to covid-19: Secondary | ICD-10-CM | POA: Diagnosis not present

## 2020-08-22 DIAGNOSIS — Z03818 Encounter for observation for suspected exposure to other biological agents ruled out: Secondary | ICD-10-CM | POA: Diagnosis not present

## 2020-09-04 ENCOUNTER — Encounter: Payer: Self-pay | Admitting: Registered Nurse

## 2020-09-04 NOTE — Progress Notes (Signed)
Acute Office Visit  Subjective:    Patient ID: Daisy Meyers, female    DOB: [birth year], 24 y.o.   MRN: [id number]  Chief Complaint  Patient presents with  . Neck Pain    patient states she has noticed the left side of her neck being swollen since wednesday. It gets a little tender to touch at times but patient just worried about the swelling    HPI Patient is in today for left side of neck swelling and tenderness  Onset Wednesday, seemed sudden No dysphagia Denies symptoms of dysthyroid No superficial lesions  No other symptoms of note   Past Medical History:  Diagnosis Date  . Depression     Past Surgical History:  Procedure Laterality Date  . CYST EXCISION     pilonidinal  . FOOT FOREIGN BODY REMOVAL  08/2016  . FOREIGN BODY REMOVAL Left 08/15/2016   Procedure: REMOVAL FOREIGN BODY LEFT FOOT;  Surgeon: Myrene Galas, MD;  Location: [facility] OR;  Service: Orthopedics;  Laterality: Left;  . WISDOM TOOTH EXTRACTION      Family History  Problem Relation Age of Onset  . Heart disease Paternal Grandfather   . Diabetes Maternal Aunt     Social History   Socioeconomic History  . Marital status: Single    Spouse name: Not on file  . Number of children: Not on file  . Years of education: Not on file  . Highest education level: Not on file  Occupational History  . Not on file  Tobacco Use  . Smoking status: Never Smoker  . Smokeless tobacco: Never Used  Vaping Use  . Vaping Use: Never used  Substance and Sexual Activity  . Alcohol use: Yes    Comment: occ  . Drug use: No  . Sexual activity: Yes  Other Topics Concern  . Not on file  Social History Narrative  . Not on file   Social Determinants of Health   Financial Resource Strain:   . Difficulty of Paying Living Expenses: Not on file  Food Insecurity:   . Worried About Programme researcher, broadcasting/film/video in the Last Year: Not on file  . Ran Out of Food in the Last Year: Not on file  Transportation Needs:   . Lack of  Transportation (Medical): Not on file  . Lack of Transportation (Non-Medical): Not on file  Physical Activity:   . Days of Exercise per Week: Not on file  . Minutes of Exercise per Session: Not on file  Stress:   . Feeling of Stress : Not on file  Social Connections:   . Frequency of Communication with Friends and Family: Not on file  . Frequency of Social Gatherings with Friends and Family: Not on file  . Attends Religious Services: Not on file  . Active Member of Clubs or Organizations: Not on file  . Attends Banker Meetings: Not on file  . Marital Status: Not on file  Intimate Partner Violence:   . Fear of Current or Ex-Partner: Not on file  . Emotionally Abused: Not on file  . Physically Abused: Not on file  . Sexually Abused: Not on file    Outpatient Medications Prior to Visit  Medication Sig Dispense Refill  . fluticasone (FLONASE) 50 MCG/ACT nasal spray Place 2 sprays into both nostrils daily. (Patient not taking: Reported on 08/08/2020) 16 g 0  . Multiple Vitamin (MULTIVITAMIN WITH MINERALS) TABS tablet Take 1 tablet by mouth daily.     . pseudoephedrine (SUDAFED)  60 MG tablet Take 1 tablet (60 mg total) by mouth every 8 (eight) hours as needed for congestion. (Patient not taking: Reported on 05/25/2020) 30 tablet 0  . TRI-PREVIFEM 0.18/0.215/0.25 MG-35 MCG tablet Take 1 tablet by mouth daily. (Patient not taking: Reported on 05/25/2020)  4   No facility-administered medications prior to visit.    Allergies  Allergen Reactions  . No Known Allergies     Review of Systems  Constitutional: Negative.   HENT: Negative.   Eyes: Negative.   Respiratory: Negative.   Cardiovascular: Negative.   Gastrointestinal: Negative.   Genitourinary: Negative.   Musculoskeletal: Negative.   Skin: Negative.   Neurological: Negative.   Psychiatric/Behavioral: Negative.        Objective:    Physical Exam Constitutional:      General: She is not in acute distress.     Appearance: Normal appearance. She is not ill-appearing, toxic-appearing or diaphoretic.  Musculoskeletal:     Cervical back: Tenderness present.  Lymphadenopathy:     Cervical: Cervical adenopathy present.  Skin:    General: Skin is warm and dry.  Neurological:     General: No focal deficit present.     Mental Status: She is alert and oriented to person, place, and time. Mental status is at baseline.  Psychiatric:        Mood and Affect: Mood normal.        Behavior: Behavior normal.        Thought Content: Thought content normal.        Judgment: Judgment normal.     BP 118/83   Pulse (!) 111   Temp (!) 97.3 F (36.3 C) (Temporal)   Resp 17   Ht 5\' 5"  (1.651 m)   Wt 219 lb 3.2 oz (99.4 kg)   SpO2 98%   BMI 36.48 kg/m  Wt Readings from Last 3 Encounters:  08/08/20 217 lb (98.4 kg)  05/25/20 219 lb 3.2 oz (99.4 kg)  01/01/20 195 lb (88.5 kg)    Health Maintenance Due  Topic Date Due  . Hepatitis C Screening  Never done  . COVID-19 Vaccine (2 - Pfizer 2-dose series) 04/13/2020  . INFLUENZA VACCINE  07/15/2020    There are no preventive care reminders to display for this patient.   Lab Results  Component Value Date   TSH 1.690 08/08/2020   Lab Results  Component Value Date   WBC 7.2 08/08/2020   HGB 13.7 08/08/2020   HCT 41.4 08/08/2020   MCV 86 08/08/2020   PLT 268 05/25/2020   Lab Results  Component Value Date   NA 135 05/25/2020   K 4.1 05/25/2020   CO2 22 05/25/2020   GLUCOSE 110 (H) 05/25/2020   BUN 8 05/25/2020   CREATININE 0.91 05/25/2020   BILITOT 0.4 05/25/2020   ALKPHOS 107 05/25/2020   AST 26 05/25/2020   ALT 32 05/25/2020   PROT 7.3 05/25/2020   ALBUMIN 4.5 05/25/2020   CALCIUM 9.8 05/25/2020   Lab Results  Component Value Date   CHOL 153 05/25/2020   Lab Results  Component Value Date   HDL 60 05/25/2020   Lab Results  Component Value Date   LDLCALC 80 05/25/2020   Lab Results  Component Value Date   TRIG 67 05/25/2020     Lab Results  Component Value Date   CHOLHDL 2.6 05/25/2020   Lab Results  Component Value Date   HGBA1C 5.3 05/25/2020       Assessment &  Plan:   Problem List Items Addressed This Visit    None    Visit Diagnoses    Tenderness of neck    -  Primary   Relevant Orders   Lipid Panel (Completed)   CBC with Differential (Completed)   Comprehensive metabolic panel (Completed)   Hemoglobin A1c (Completed)   Thyroid Panel With TSH (Completed)   Weight gain       Relevant Orders   Lipid Panel (Completed)   CBC with Differential (Completed)   Comprehensive metabolic panel (Completed)   Hemoglobin A1c (Completed)   Thyroid Panel With TSH (Completed)       No orders of the defined types were placed in this encounter.  PLAN  Unclear etiology. Will start with baseline labs and reassess. Likely a transient infection - suggest contact precautions with others and close monitoring of symptoms  Patient encouraged to call clinic with any questions, comments, or concerns.   [name], NP

## 2020-10-14 ENCOUNTER — Encounter: Payer: Self-pay | Admitting: Registered Nurse

## 2020-10-14 NOTE — Progress Notes (Signed)
Established Patient Office Visit  Subjective:  Patient ID: Daisy Meyers, female    DOB: Nov 04, 1996  Age: 24 y.o. MRN: [id number]  CC:  Chief Complaint  Patient presents with   transfer of care    HPI Timiko Offutt presents for visit to est care  Having ongoing cervical lymphadenopathy. Did not clear after last visit. Still mildly tender, swollen, do not seem to be fixed. Still having some fatigue but no other symptoms. No sick contacts to her knowledge. Mostly unilateral  No further complaints  Histories reviewed with patient and updated as warranted Recent labs reviewed with patient   Past Medical History:  Diagnosis Date   Depression     Past Surgical History:  Procedure Laterality Date   CYST EXCISION     pilonidinal   FOOT FOREIGN BODY REMOVAL  08/2016   FOREIGN BODY REMOVAL Left 08/15/2016   Procedure: REMOVAL FOREIGN BODY LEFT FOOT;  Surgeon: Myrene Galas, MD;  Location: [facility] OR;  Service: Orthopedics;  Laterality: Left;   WISDOM TOOTH EXTRACTION      Family History  Problem Relation Age of Onset   Heart disease Paternal Grandfather    Diabetes Maternal Aunt     Social History   Socioeconomic History   Marital status: Single    Spouse name: Not on file   Number of children: Not on file   Years of education: Not on file   Highest education level: Not on file  Occupational History   Not on file  Tobacco Use   Smoking status: Never Smoker   Smokeless tobacco: Never Used  Vaping Use   Vaping Use: Never used  Substance and Sexual Activity   Alcohol use: Yes    Comment: occ   Drug use: No   Sexual activity: Yes  Other Topics Concern   Not on file  Social History Narrative   Not on file   Social Determinants of Health   Financial Resource Strain:    Difficulty of Paying Living Expenses: Not on file  Food Insecurity:    Worried About Running Out of Food in the Last Year: Not on file   Ran Out of Food in the Last Year: Not  on file  Transportation Needs:    Lack of Transportation (Medical): Not on file   Lack of Transportation (Non-Medical): Not on file  Physical Activity:    Days of Exercise per Week: Not on file   Minutes of Exercise per Session: Not on file  Stress:    Feeling of Stress : Not on file  Social Connections:    Frequency of Communication with Friends and Family: Not on file   Frequency of Social Gatherings with Friends and Family: Not on file   Attends Religious Services: Not on file   Active Member of Clubs or Organizations: Not on file   Attends Banker Meetings: Not on file   Marital Status: Not on file  Intimate Partner Violence:    Fear of Current or Ex-Partner: Not on file   Emotionally Abused: Not on file   Physically Abused: Not on file   Sexually Abused: Not on file    Outpatient Medications Prior to Visit  Medication Sig Dispense Refill   Multiple Vitamin (MULTIVITAMIN WITH MINERALS) TABS tablet Take 1 tablet by mouth daily.      fluticasone (FLONASE) 50 MCG/ACT nasal spray Place 2 sprays into both nostrils daily. (Patient not taking: Reported on 08/08/2020) 16 g 0   pseudoephedrine (SUDAFED) 60  MG tablet Take 1 tablet (60 mg total) by mouth every 8 (eight) hours as needed for congestion. (Patient not taking: Reported on 05/25/2020) 30 tablet 0   TRI-PREVIFEM 0.18/0.215/0.25 MG-35 MCG tablet Take 1 tablet by mouth daily. (Patient not taking: Reported on 05/25/2020)  4   No facility-administered medications prior to visit.    Allergies  Allergen Reactions   No Known Allergies     ROS Review of Systems  Constitutional: Negative.   HENT: Negative.   Eyes: Negative.   Respiratory: Negative.   Cardiovascular: Negative.   Gastrointestinal: Negative.   Genitourinary: Negative.   Musculoskeletal: Negative.   Skin: Negative.   Neurological: Negative.   Hematological: Positive for adenopathy. Does not bruise/bleed easily.    Psychiatric/Behavioral: Negative.       Objective:    Physical Exam Vitals and nursing note reviewed.  Constitutional:      General: She is not in acute distress.    Appearance: Normal appearance. She is normal weight. She is not ill-appearing, toxic-appearing or diaphoretic.  Neck:     Vascular: No carotid bruit.  Cardiovascular:     Rate and Rhythm: Normal rate and regular rhythm.     Heart sounds: Normal heart sounds. No murmur heard.  No friction rub. No gallop.   Pulmonary:     Effort: Pulmonary effort is normal. No respiratory distress.     Breath sounds: Normal breath sounds. No stridor. No wheezing, rhonchi or rales.  Chest:     Chest wall: No tenderness.  Musculoskeletal:     Cervical back: Normal range of motion and neck supple. Tenderness present. No rigidity.  Lymphadenopathy:     Cervical: Cervical adenopathy present.  Skin:    General: Skin is warm and dry.  Neurological:     General: No focal deficit present.     Mental Status: She is alert and oriented to person, place, and time. Mental status is at baseline.  Psychiatric:        Mood and Affect: Mood normal.        Behavior: Behavior normal.        Thought Content: Thought content normal.        Judgment: Judgment normal.     BP 118/77    Pulse 81    Temp 98.6 F (37 C)    Ht 5\' 5"  (1.651 m)    Wt 217 lb (98.4 kg)    LMP 07/25/2020 (Approximate)    SpO2 98%    BMI 36.11 kg/m  Wt Readings from Last 3 Encounters:  08/08/20 217 lb (98.4 kg)  05/25/20 219 lb 3.2 oz (99.4 kg)  01/01/20 195 lb (88.5 kg)     Health Maintenance Due  Topic Date Due   Hepatitis C Screening  Never done   HIV Screening  Never done   COVID-19 Vaccine (2 - Pfizer 2-dose series) 04/13/2020   INFLUENZA VACCINE  07/15/2020    There are no preventive care reminders to display for this patient.  Lab Results  Component Value Date   TSH 1.690 08/08/2020   Lab Results  Component Value Date   WBC 7.2 08/08/2020   HGB  13.7 08/08/2020   HCT 41.4 08/08/2020   MCV 86 08/08/2020   PLT 268 05/25/2020   Lab Results  Component Value Date   NA 135 05/25/2020   K 4.1 05/25/2020   CO2 22 05/25/2020   GLUCOSE 110 (H) 05/25/2020   BUN 8 05/25/2020   CREATININE 0.91 05/25/2020  BILITOT 0.4 05/25/2020   ALKPHOS 107 05/25/2020   AST 26 05/25/2020   ALT 32 05/25/2020   PROT 7.3 05/25/2020   ALBUMIN 4.5 05/25/2020   CALCIUM 9.8 05/25/2020   Lab Results  Component Value Date   CHOL 153 05/25/2020   Lab Results  Component Value Date   HDL 60 05/25/2020   Lab Results  Component Value Date   LDLCALC 80 05/25/2020   Lab Results  Component Value Date   TRIG 67 05/25/2020   Lab Results  Component Value Date   CHOLHDL 2.6 05/25/2020   Lab Results  Component Value Date   HGBA1C 5.3 05/25/2020      Assessment & Plan:   Problem List Items Addressed This Visit    None    Visit Diagnoses    Lymphadenopathy of left cervical region    -  Primary   Relevant Orders   CMV abs, IgG+IgM (cytomegalovirus) (Completed)   Mononucleosis screen (Completed)   TSH (Completed)   CBC With Differential (Completed)   US SOFT TISSUE NECK (Completed)      No orders of the defined types were placed in this encounter.   Follow-up: No follow-ups on file.   PLAN  Suspect cmv or mono - will draw labs  Will follow up with tsh and cbc check  US soft tissue neck to determine scope of abnormalities  Follow up as warranted based on results  Patient encouraged to call clinic with any questions, comments, or concerns.  [name], NP

## 2020-10-31 DIAGNOSIS — D2261 Melanocytic nevi of right upper limb, including shoulder: Secondary | ICD-10-CM | POA: Diagnosis not present

## 2020-12-19 DIAGNOSIS — Z30430 Encounter for insertion of intrauterine contraceptive device: Secondary | ICD-10-CM | POA: Diagnosis not present

## 2020-12-19 DIAGNOSIS — Z3202 Encounter for pregnancy test, result negative: Secondary | ICD-10-CM | POA: Diagnosis not present

## 2020-12-22 DIAGNOSIS — Z1152 Encounter for screening for COVID-19: Secondary | ICD-10-CM | POA: Diagnosis not present

## 2021-06-11 ENCOUNTER — Ambulatory Visit
Admission: RE | Admit: 2021-06-11 | Discharge: 2021-06-11 | Disposition: A | Discharge status: Home / Self Care | Payer: BC Managed Care – PPO | Source: Ambulatory Visit | Attending: Adult Health | Admitting: Adult Health

## 2021-06-11 ENCOUNTER — Other Ambulatory Visit: Payer: Self-pay

## 2021-06-11 ENCOUNTER — Ambulatory Visit
Admission: RE | Admit: 2021-06-11 | Discharge: 2021-06-11 | Disposition: A | Discharge status: Home / Self Care | Payer: BC Managed Care – PPO | Attending: Adult Health | Admitting: Adult Health

## 2021-06-11 ENCOUNTER — Other Ambulatory Visit: Payer: Self-pay | Admitting: Adult Health

## 2021-06-11 DIAGNOSIS — R52 Pain, unspecified: Secondary | ICD-10-CM

## 2021-06-11 DIAGNOSIS — M795 Residual foreign body in soft tissue: Secondary | ICD-10-CM | POA: Insufficient documentation

## 2021-06-11 DIAGNOSIS — M79672 Pain in left foot: Secondary | ICD-10-CM | POA: Diagnosis not present

## 2021-07-17 DIAGNOSIS — D1801 Hemangioma of skin and subcutaneous tissue: Secondary | ICD-10-CM | POA: Diagnosis not present

## 2021-07-17 DIAGNOSIS — D2230 Melanocytic nevi of unspecified part of face: Secondary | ICD-10-CM | POA: Diagnosis not present

## 2021-07-17 DIAGNOSIS — L814 Other melanin hyperpigmentation: Secondary | ICD-10-CM | POA: Diagnosis not present

## 2021-07-17 DIAGNOSIS — L578 Other skin changes due to chronic exposure to nonionizing radiation: Secondary | ICD-10-CM | POA: Diagnosis not present

## 2021-08-21 DIAGNOSIS — Z130 Encounter for screening for diseases of the blood and blood-forming organs and certain disorders involving the immune mechanism: Secondary | ICD-10-CM | POA: Diagnosis not present

## 2021-08-21 DIAGNOSIS — Z01419 Encounter for gynecological examination (general) (routine) without abnormal findings: Secondary | ICD-10-CM | POA: Diagnosis not present

## 2021-08-21 DIAGNOSIS — Z124 Encounter for screening for malignant neoplasm of cervix: Secondary | ICD-10-CM | POA: Diagnosis not present

## 2021-08-21 DIAGNOSIS — Z1151 Encounter for screening for human papillomavirus (HPV): Secondary | ICD-10-CM | POA: Diagnosis not present

## 2022-03-04 DIAGNOSIS — R102 Pelvic and perineal pain: Secondary | ICD-10-CM | POA: Diagnosis not present

## 2022-04-10 DIAGNOSIS — D2261 Melanocytic nevi of right upper limb, including shoulder: Secondary | ICD-10-CM | POA: Diagnosis not present

## 2022-04-10 DIAGNOSIS — D2272 Melanocytic nevi of left lower limb, including hip: Secondary | ICD-10-CM | POA: Diagnosis not present

## 2022-07-25 ENCOUNTER — Inpatient Hospital Stay (HOSPITAL_COMMUNITY)
Admission: EM | Admit: 2022-07-25 | Discharge: 2022-07-27 | DRG: 813 | Disposition: A | Discharge status: Home / Self Care | Payer: BC Managed Care – PPO | Attending: Internal Medicine | Admitting: Internal Medicine

## 2022-07-25 ENCOUNTER — Inpatient Hospital Stay (HOSPITAL_COMMUNITY): Payer: BC Managed Care – PPO

## 2022-07-25 ENCOUNTER — Encounter (HOSPITAL_COMMUNITY): Payer: Self-pay

## 2022-07-25 DIAGNOSIS — Z975 Presence of (intrauterine) contraceptive device: Secondary | ICD-10-CM

## 2022-07-25 DIAGNOSIS — Z833 Family history of diabetes mellitus: Secondary | ICD-10-CM | POA: Diagnosis not present

## 2022-07-25 DIAGNOSIS — D649 Anemia, unspecified: Secondary | ICD-10-CM | POA: Diagnosis present

## 2022-07-25 DIAGNOSIS — Z8249 Family history of ischemic heart disease and other diseases of the circulatory system: Secondary | ICD-10-CM

## 2022-07-25 DIAGNOSIS — D6959 Other secondary thrombocytopenia: Secondary | ICD-10-CM | POA: Diagnosis not present

## 2022-07-25 DIAGNOSIS — R531 Weakness: Secondary | ICD-10-CM | POA: Diagnosis present

## 2022-07-25 DIAGNOSIS — B1920 Unspecified viral hepatitis C without hepatic coma: Secondary | ICD-10-CM | POA: Diagnosis present

## 2022-07-25 DIAGNOSIS — D509 Iron deficiency anemia, unspecified: Secondary | ICD-10-CM | POA: Diagnosis not present

## 2022-07-25 DIAGNOSIS — E669 Obesity, unspecified: Secondary | ICD-10-CM | POA: Diagnosis not present

## 2022-07-25 DIAGNOSIS — D72829 Elevated white blood cell count, unspecified: Secondary | ICD-10-CM

## 2022-07-25 DIAGNOSIS — D693 Immune thrombocytopenic purpura: Principal | ICD-10-CM | POA: Diagnosis present

## 2022-07-25 DIAGNOSIS — N939 Abnormal uterine and vaginal bleeding, unspecified: Secondary | ICD-10-CM | POA: Diagnosis not present

## 2022-07-25 DIAGNOSIS — D6832 Hemorrhagic disorder due to extrinsic circulating anticoagulants: Secondary | ICD-10-CM | POA: Diagnosis not present

## 2022-07-25 DIAGNOSIS — N920 Excessive and frequent menstruation with regular cycle: Secondary | ICD-10-CM | POA: Diagnosis not present

## 2022-07-25 DIAGNOSIS — Z6839 Body mass index (BMI) 39.0-39.9, adult: Secondary | ICD-10-CM | POA: Diagnosis not present

## 2022-07-25 DIAGNOSIS — D696 Thrombocytopenia, unspecified: Secondary | ICD-10-CM

## 2022-07-25 DIAGNOSIS — N922 Excessive menstruation at puberty: Secondary | ICD-10-CM | POA: Diagnosis not present

## 2022-07-25 DIAGNOSIS — Z79899 Other long term (current) drug therapy: Secondary | ICD-10-CM | POA: Diagnosis not present

## 2022-07-25 DIAGNOSIS — D689 Coagulation defect, unspecified: Secondary | ICD-10-CM

## 2022-07-25 LAB — CBC
HCT: 15.5 % — ABNORMAL LOW (ref 36.0–46.0)
Hemoglobin: 4.9 g/dL — CL (ref 12.0–15.0)
MCH: 25 pg — ABNORMAL LOW (ref 26.0–34.0)
MCHC: 31.6 g/dL (ref 30.0–36.0)
MCV: 79.1 fL — ABNORMAL LOW (ref 80.0–100.0)
Platelets: 19 10*3/uL — CL (ref 150–400)
RBC: 1.96 MIL/uL — ABNORMAL LOW (ref 3.87–5.11)
RDW: 14.6 % (ref 11.5–15.5)
WBC: 9 10*3/uL (ref 4.0–10.5)
nRBC: 0.2 % (ref 0.0–0.2)

## 2022-07-25 LAB — IRON AND TIBC
Iron: 115 ug/dL (ref 28–170)
Saturation Ratios: 30 % (ref 10.4–31.8)
TIBC: 385 ug/dL (ref 250–450)
UIBC: 270 ug/dL

## 2022-07-25 LAB — COMPREHENSIVE METABOLIC PANEL
ALT: 15 U/L (ref 0–44)
AST: 16 U/L (ref 15–41)
Albumin: 3.3 g/dL — ABNORMAL LOW (ref 3.5–5.0)
Alkaline Phosphatase: 62 U/L (ref 38–126)
Anion gap: 8 (ref 5–15)
BUN: 10 mg/dL (ref 6–20)
CO2: 22 mmol/L (ref 22–32)
Calcium: 8.3 mg/dL — ABNORMAL LOW (ref 8.9–10.3)
Chloride: 108 mmol/L (ref 98–111)
Creatinine, Ser: 0.96 mg/dL (ref 0.44–1.00)
GFR, Estimated: >60 mL/min (ref >60)
Glucose, Bld: 105 mg/dL — ABNORMAL HIGH (ref 70–99)
Potassium: 3.6 mmol/L (ref 3.5–5.1)
Sodium: 138 mmol/L (ref 135–145)
Total Bilirubin: 0.5 mg/dL (ref 0.3–1.2)
Total Protein: 6.2 g/dL — ABNORMAL LOW (ref 6.5–8.1)

## 2022-07-25 LAB — APTT: aPTT: 20 seconds — ABNORMAL LOW (ref 24–36)

## 2022-07-25 LAB — RETICULOCYTES
Immature Retic Fract: 42 % — ABNORMAL HIGH (ref 2.3–15.9)
RBC.: 1.98 MIL/uL — ABNORMAL LOW (ref 3.87–5.11)
Retic Count, Absolute: 62 10*3/uL (ref 19.0–186.0)
Retic Ct Pct: 3.1 % (ref 0.4–3.1)

## 2022-07-25 LAB — VITAMIN B12: Vitamin B-12: 448 pg/mL (ref 180–914)

## 2022-07-25 LAB — PROTIME-INR
INR: 1.1 (ref 0.8–1.2)
Prothrombin Time: 13.6 seconds (ref 11.4–15.2)

## 2022-07-25 LAB — ABO/RH: ABO/RH(D): O POS

## 2022-07-25 LAB — FERRITIN: Ferritin: 5 ng/mL — ABNORMAL LOW (ref 11–307)

## 2022-07-25 LAB — I-STAT BETA HCG BLOOD, ED (MC, WL, AP ONLY): I-stat hCG, quantitative: <5 m[IU]/mL (ref <5)

## 2022-07-25 LAB — FOLATE: Folate: 8.3 ng/mL (ref >5.9)

## 2022-07-25 LAB — PREGNANCY, URINE: Preg Test, Ur: NEGATIVE

## 2022-07-25 LAB — PREPARE RBC (CROSSMATCH)

## 2022-07-25 MED ORDER — ESTROGENS CONJUGATED 25 MG IJ SOLR
25.0000 mg | Freq: Four times a day (QID) | INTRAMUSCULAR | Status: AC
  Administered 2022-07-26 (×4): 25 mg via INTRAVENOUS
  Filled 2022-07-25 (×4): qty 25

## 2022-07-25 MED ORDER — STERILE WATER FOR INJECTION IJ SOLN
INTRAMUSCULAR | Status: AC
  Filled 2022-07-25: qty 10

## 2022-07-25 MED ORDER — LACTATED RINGERS IV BOLUS
1000.0000 mL | Freq: Once | INTRAVENOUS | Status: AC
  Administered 2022-07-25: 1000 mL via INTRAVENOUS

## 2022-07-25 MED ORDER — TRANEXAMIC ACID-NACL 1000-0.7 MG/100ML-% IV SOLN
1000.0000 mg | Freq: Once | INTRAVENOUS | Status: AC
  Administered 2022-07-25: 1000 mg via INTRAVENOUS
  Filled 2022-07-25: qty 100

## 2022-07-25 MED ORDER — METHYLPREDNISOLONE SODIUM SUCC 125 MG IJ SOLR
125.0000 mg | Freq: Every day | INTRAMUSCULAR | Status: DC
  Administered 2022-07-26 – 2022-07-27 (×2): 125 mg via INTRAVENOUS
  Filled 2022-07-25 (×2): qty 2

## 2022-07-25 MED ORDER — METHYLPREDNISOLONE SODIUM SUCC 125 MG IJ SOLR
125.0000 mg | Freq: Once | INTRAMUSCULAR | Status: AC
  Administered 2022-07-25: 125 mg via INTRAVENOUS
  Filled 2022-07-25: qty 2

## 2022-07-25 MED ORDER — SODIUM CHLORIDE 0.9% IV SOLUTION: Freq: Once | INTRAVENOUS | Status: AC

## 2022-07-25 MED ORDER — ESTROGENS CONJUGATED 25 MG IJ SOLR
25.0000 mg | Freq: Once | INTRAMUSCULAR | Status: AC
  Administered 2022-07-25: 25 mg via INTRAVENOUS
  Filled 2022-07-25: qty 25

## 2022-07-25 NOTE — ED Notes (Addendum)
Delay in blood work d/t pt being a difficult stick. Multiple staff attempts.

## 2022-07-25 NOTE — Assessment & Plan Note (Signed)
Plt of 19.  -Hematology Dr. 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 Recommends prednisone 1mg /kg/day. Indicated no benefit to plt transfusion at this time.  -iron panel, vitamin B12, folic and retic labs pending -will add on HIV, hepatitis C  -add peripheral smear

## 2022-07-25 NOTE — ED Triage Notes (Signed)
Pt coming from home c/o heavy vaginal bleeding since Tuesday. Pt saw her dr this morning where she got her blood drawn. She was called with results and told her platelets were low and her was hemoglobin 5.2. Pt states she is feeling dizzy, weak, pounding HA, tachycardia.

## 2022-07-25 NOTE — Assessment & Plan Note (Signed)
Symptomatic anemia  -Menstrual cycles at baseline comes once a month. Lasts about a week and a half and heavy usually on day 3-4. This is now about day 9 of her cycle and has been persistently heavy for last 3 days. -Hgb of 4.9 with Plt of 19 -urine pregnancy and HcG is negative. Has IUD in place.  -Pelvic ultrasound with IUD in expected location. Normal uterus and ovaries.  -OB/GYN consulted by EDP and has received IV TXA, conjugated estrogen -will continue IV conjugated estrogen q6hr for 24 hours -Transfuse 2uPRBC and follow post H/H. Goal of Hgb >8 due to ongoing bleed

## 2022-07-25 NOTE — ED Provider Notes (Signed)
[facility]-EMERGENCY DEPT Provider Note   CSN: [id number] Arrival date & time: 07/25/22  1446     History  Chief Complaint  Patient presents with   Abnormal Labs   Vaginal Bleeding    Daisy Meyers is a 26 y.o. female.  Patient reports heavy vaginal bleeding and feeling generally weak for the past few days. States bleeding started 3-4 days ago, was heavier than normal period - states uses pads and tampons, and changes q 30 minutes. Prior to this denies recurrent/heavy period bleeding or hx anemia. Denies other abnormal bruising or bleeding. States went to her ob/gyn doctor today, states nobody could see her there, but they did labs and called her and told her that hemoglobin was low, 5.2 and to go to ED.    The history is provided by the patient and medical records.  Vaginal Bleeding Associated symptoms: no abdominal pain, no back pain and no fever        Home Medications Prior to Admission medications   Medication Sig Start Date End Date Taking? Authorizing Provider  fluticasone (FLONASE) 50 MCG/ACT nasal spray Place 2 sprays into both nostrils daily. Patient not taking: Reported on 08/08/2020 01/01/20   Wallis Bamberg, PA-C  Multiple Vitamin (MULTIVITAMIN WITH MINERALS) TABS tablet Take 1 tablet by mouth daily.     [provider]      Allergies    No known allergies    Review of Systems   Review of Systems  Constitutional:  Negative for chills and fever.  HENT:  Negative for nosebleeds.   Eyes:  Negative for redness.  Respiratory:  Negative for shortness of breath.   Cardiovascular:  Negative for chest pain.  Gastrointestinal:  Negative for abdominal pain, blood in stool and vomiting.  Genitourinary:  Positive for vaginal bleeding. Negative for flank pain and hematuria.  Musculoskeletal:  Negative for back pain.  Skin:  Negative for rash.  Neurological:  Positive for light-headedness.  Hematological:  Does not bruise/bleed easily.   Psychiatric/Behavioral:  Negative for confusion.     Physical Exam Updated Vital Signs BP 124/79   Pulse (!) 125   Temp 98.7 F (37.1 C) (Oral)   Resp 17   Ht 1.651 m (5\' 5" )   Wt 108.9 kg   LMP 07/16/2022 (Approximate)   SpO2 100%   BMI 39.94 kg/m  Physical Exam Vitals and nursing note reviewed.  Constitutional:      Appearance: Normal appearance. She is well-developed.  HENT:     Head: Atraumatic.     Nose: Nose normal.     Mouth/Throat:     Mouth: Mucous membranes are moist.  Eyes:     General: No scleral icterus.    Pupils: Pupils are equal, round, and reactive to light.     Comments: Conj pale.  Neck:     Trachea: No tracheal deviation.  Cardiovascular:     Rate and Rhythm: Regular rhythm. Tachycardia present.     Pulses: Normal pulses.     Heart sounds: Normal heart sounds. No murmur heard.    No friction rub. No gallop.  Pulmonary:     Effort: Pulmonary effort is normal. No respiratory distress.     Breath sounds: Normal breath sounds.  Abdominal:     General: Bowel sounds are normal. There is no distension.     Palpations: Abdomen is soft.     Tenderness: There is no abdominal tenderness.  Genitourinary:    Comments: No cva tenderness.  Moderate  amount dark blood in vagina, no heavy/brisk bleeding via cervix. No mass felt.  Musculoskeletal:        General: No swelling.     Cervical back: Normal range of motion and neck supple. No rigidity. No muscular tenderness.  Skin:    General: Skin is warm and dry.     Coloration: Skin is pale.     Findings: No rash.  Neurological:     Mental Status: She is alert.     Comments: Alert, speech normal.   Psychiatric:        Mood and Affect: Mood normal.     ED Results / Procedures / Treatments   Labs (all labs ordered are listed, but only abnormal results are displayed) Results for orders placed or performed during the hospital encounter of 07/25/22  CBC  Result Value Ref Range   WBC 9.0 4.0 - 10.5 K/uL    RBC 1.96 (L) 3.87 - 5.11 MIL/uL   Hemoglobin 4.9 (LL) 12.0 - 15.0 g/dL   HCT 50.0 (L) 93.8 - 18.2 %   MCV 79.1 (L) 80.0 - 100.0 fL   MCH 25.0 (L) 26.0 - 34.0 pg   MCHC 31.6 30.0 - 36.0 g/dL   RDW 99.3 71.6 - 96.7 %   Platelets 19 (LL) 150 - 400 K/uL   nRBC 0.2 0.0 - 0.2 %  Comprehensive metabolic panel  Result Value Ref Range   Sodium 138 135 - 145 mmol/L   Potassium 3.6 3.5 - 5.1 mmol/L   Chloride 108 98 - 111 mmol/L   CO2 22 22 - 32 mmol/L   Glucose, Bld 105 (H) 70 - 99 mg/dL   BUN 10 6 - 20 mg/dL   Creatinine, Ser 8.93 0.44 - 1.00 mg/dL   Calcium 8.3 (L) 8.9 - 10.3 mg/dL   Total Protein 6.2 (L) 6.5 - 8.1 g/dL   Albumin 3.3 (L) 3.5 - 5.0 g/dL   AST 16 15 - 41 U/L   ALT 15 0 - 44 U/L   Alkaline Phosphatase 62 38 - 126 U/L   Total Bilirubin 0.5 0.3 - 1.2 mg/dL   GFR, Estimated >81 >01 mL/min   Anion gap 8 5 - 15  Pregnancy, urine  Result Value Ref Range   Preg Test, Ur NEGATIVE NEGATIVE  Protime-INR  Result Value Ref Range   Prothrombin Time 13.6 11.4 - 15.2 seconds   INR 1.1 0.8 - 1.2  APTT  Result Value Ref Range   aPTT 20 (L) 24 - 36 seconds  Vitamin B12  Result Value Ref Range   Vitamin B-12 448 180 - 914 pg/mL  Folate  Result Value Ref Range   Folate 8.3 >5.9 ng/mL  Iron and TIBC  Result Value Ref Range   Iron 115 28 - 170 ug/dL   TIBC 751 025 - 852 ug/dL   Saturation Ratios 30 10.4 - 31.8 %   UIBC 270 ug/dL  Ferritin  Result Value Ref Range   Ferritin 5 (L) 11 - 307 ng/mL  Reticulocytes  Result Value Ref Range   Retic Ct Pct 3.1 0.4 - 3.1 %   RBC. 1.98 (L) 3.87 - 5.11 MIL/uL   Retic Count, Absolute 62.0 19.0 - 186.0 K/uL   Immature Retic Fract 42.0 (H) 2.3 - 15.9 %  I-Stat beta hCG blood, ED ([facility], [facility], [facility] only)  Result Value Ref Range   I-stat hCG, quantitative <5.0 <5 mIU/mL   Comment 3  Type and screen  Result Value Ref Range   ABO/RH(D) O POS    Antibody Screen NEG    Sample Expiration      07/28/2022,2359 Performed at [facility], [address], [place], [place] [address]    Unit Number [id number]    Blood Component Type RED CELLS,LR    Unit division 00    Status of Unit ISSUED    Unit tag comment EMERGENCY RELEASE    Transfusion Status OK TO TRANSFUSE    Crossmatch Result COMPATIBLE   Prepare RBC (crossmatch)  Result Value Ref Range   Order Confirmation      ORDER PROCESSED BY BLOOD BANK Performed at [facility], [address], [place], [place] [address]   BPAM [facility]  Result Value Ref Range   ISSUE DATE / TIME [id number]    Blood Product Unit Number [id number]    Unit Type and Rh 9500    Blood Product Expiration Date [id number]      EKG None  Radiology No results found.  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 External Data Reviewed: notes. Labs: ordered. Decision-making details documented in ED Course. Radiology: ordered. ECG/medicine tests:  ordered.  Risk Prescription drug management. Drug therapy requiring intensive monitoring for toxicity. Decision regarding hospitalization.   Iv ns. Continuous pulse ox and cardiac monitoring. Labs ordered/sent.   Reviewed nursing notes and prior charts for additional history. External reports reviewed.   Cardiac monitor: sinus rhythm, rate 120.  Labs reviewed/interpreted by me - hgb very low, 4.9, plt 19 - called back hematology when new labs resulted, discussed w Dr Clelia Croft.   Pt's ob/gyn consulted. Discussed w on-call provider for [place] [facility] - she indicates hgb 5, plt 21, could give dose conj estrogen 25 mg iv, and txa, to stop/improve vag bleeding, but that likely abn bleeding related to low plt, as opposed to other cause of menorrhagia, as no hx same, rec admit to medicine.   Estrogen iv, txa iv.   Hematology consulted - discussed pt and labs with Dr Clelia Croft. He indicates start on steroid rx, keep on prednisone 1 mg/kg/day, he will consult, add anemia panel to labs. Indicates no benefit to plt transfusion.   2nd iv line.  Prbc transfusion.   With prbc infusion, pt notes feels somewhat improved. No abd pain or tenderness. Pt indicates feels amount of bleeding mildly improved from prior.  Hospitalists consulted for admission. Will add pelvic u/s to workup.   CRITICAL CARE RE: ITP with acute/severe anemia, tachycardia, menorrhagia requiring emergent parenteral therapy/prbc transfusion.  Performed by: Suzi Roots Total critical care time: 110 minutes Critical care time was exclusive of separately billable procedures and treating other patients. Critical care was necessary to treat or prevent imminent or life-threatening deterioration. Critical care was time spent personally by me on the following activities: development of treatment plan with patient and/or surrogate as well as nursing, discussions with consultants, evaluation of patient's response to treatment, examination of  patient, obtaining history from patient or surrogate, ordering and performing treatments and interventions, ordering and review of laboratory studies, ordering and review of radiographic studies, pulse oximetry and re-evaluation of patient's condition.          Final Clinical Impression(s) / ED Diagnoses Final diagnoses:  None    Rx / DC Orders ED Discharge Orders     None         Cathren Laine, MD 07/25/22 1946

## 2022-07-25 NOTE — H&P (Signed)
History and Physical    Patient: Daisy Meyers NWG:[id number] DOB: 12-Oct-1996 DOA: 07/25/2022 DOS: the patient was seen and examined on 07/26/2022 PCP: [name], NP  Patient coming from: Home  Chief Complaint:  Chief Complaint  Patient presents with   Abnormal Labs   Vaginal Bleeding   HPI: Daisy Meyers is a 26 y.o. female with no significant past medical hx presents with increase vaginal bleeding.   She started her normal menstrual cycle last week and about 6 days in she begin to have heavy persistent bleeding. This has been ongoing for the last 3 days of going through thick pads, doubling them up and with tampons. Has felt dizzy, short of breath, tachycardic. She presented with these symptoms to OB/GYN but just had lab work, took a dose of norethindrone, iron and advised to come to ED for severe anemia.   Her cycles at baseline comes once a month. Lasts about a week and a half and heavy usually on day 3-4.  States her bleeding has increased since June but nothing as much as this time. Reports last transvaginal ultrasound around March of this year with for menstrual pain but no acute findings. She is sexually active with one partner and last had intercourse about 5 days ago. No hx of STD. No vaginal trauma. Has IUD.  No tobacco, alcohol or illicit drug use. No family hx of autoimmune or blood disorder.  In the ED, She was tachycardic to HR 130s with Hgb of 4.9, plt 21.  Pt's ob/gyn consulted by EDP. Discussed w on-call provider for [place] [facility] - she advised dose of conj estrogen 25 mg iv, and txa, to stop/improve vag bleeding, but that likely abnormal bleeding related to low plt, as opposed to other cause of menorrhagia, as no hx same, recommended admit to medicine.    Hematology Dr. Clelia Croft consulted. He indicates start on steroid rx, keep on prednisone 1 mg/kg/day, he will consult, add anemia panel to labs. Indicates no benefit to plt transfusion.   Getting 2 upRBC transfusion.  Pelvic ultrasound pending.  Hospitalist called for admission. She currently notes improving to her bleeding after treatments.  Review of Systems: As mentioned in the history of present illness. All other systems reviewed and are negative. Past Medical History:  Diagnosis Date   Depression    Past Surgical History:  Procedure Laterality Date   CYST EXCISION     pilonidinal   FOOT FOREIGN BODY REMOVAL  08/2016   FOREIGN BODY REMOVAL Left 08/15/2016   Procedure: REMOVAL FOREIGN BODY LEFT FOOT;  Surgeon: Myrene Galas, MD;  Location: [facility] OR;  Service: Orthopedics;  Laterality: Left;   WISDOM TOOTH EXTRACTION     Social History:  reports that she has never smoked. She has never used smokeless tobacco. She reports current alcohol use. She reports that she does not use drugs.  Allergies  Allergen Reactions   No Known Allergies     Family History  Problem Relation Age of Onset   Heart disease Paternal Grandfather    Diabetes Maternal Aunt     Prior to Admission medications   Medication Sig Start Date End Date Taking? Authorizing Provider  Ferrous Sulfate (IRON PO) Take 1 tablet by mouth daily.   Yes [provider]  norethindrone (AYGESTIN) 5 MG tablet Take 5 mg by mouth daily. 07/25/22  Yes [provider]  fluticasone (FLONASE) 50 MCG/ACT nasal spray Place 2 sprays into both nostrils daily. Patient not taking: Reported on 08/08/2020 01/01/20  Wallis Bamberg, [place]    Physical Exam: Vitals:   07/25/22 2133 07/25/22 2138 07/25/22 2300 07/26/22 0145  BP: 127/87 127/87  125/60  Pulse: (!) 118 (!) 115  88  Resp: 18 14  18   Temp: 99.4 F (37.4 C) 99.4 F (37.4 C) 98.5 F (36.9 C) 99 F (37.2 C)  TempSrc: Oral Oral Oral Axillary  SpO2: 100% 100%  100%  Weight:      Height:       Constitutional: NAD, ill-appearing, diaphoretic and pale young female laying flat in bed Eyes: lids and conjunctivae normal ENMT: Mucous membranes are moist.  Neck: normal,  supple Respiratory: clear to auscultation bilaterally, no wheezing, no crackles. Normal respiratory effort. No accessory muscle use.  Cardiovascular: Regular rate and rhythm, no murmurs / rubs / gallops. No extremity edema.  Abdomen: Soft, nondistended, no tenderness, no masses palpated. . Bowel sounds positive.  Musculoskeletal: no clubbing / cyanosis. No joint deformity upper and lower extremities. Good ROM, no contractures. Normal muscle tone.  Skin: no rashes, lesions, ulcers. No induration Neurologic: CN 2-12 grossly intact. Sensation intact,  Strength 5/5 in all 4.  Psychiatric: Normal judgment and insight. Alert and oriented x 3. Normal mood. Data Reviewed:  See HPI  Assessment and Plan: * Abnormal vaginal bleeding Symptomatic anemia  -Menstrual cycles at baseline comes once a month. Lasts about a week and a half and heavy usually on day 3-4. This is now about day 9 of her cycle and has been persistently heavy for last 3 days. -Hgb of 4.9 with Plt of 19 -urine pregnancy and HcG is negative. Has IUD in place.  -Pelvic ultrasound with IUD in expected location. Normal uterus and ovaries.  -OB/GYN consulted by EDP and has received IV TXA, conjugated estrogen -will continue IV conjugated estrogen q6hr for 24 hours -Transfuse 2uPRBC and follow post H/H with q2hr check after. Goal of Hgb >8 due to ongoing bleed  Thrombocytopenia (HCC) Plt of 19.  -Hematology Dr. consulted by EDP and is concerned about possible ITP. Recommends prednisone 1mg /kg/day. Indicated no benefit to plt transfusion at this time.  -iron panel, vitamin B12, folic and retic labs pending -will add on HIV, hepatitis C  -add peripheral smear        Advance Care Planning:   Code Status: Full Code Full  Consults: EDP curbsided OB/GYN,  Hematology will see in consultation tomorrow  Family Communication: Father and fianc at bedside  Severity of Illness: The appropriate patient status for this patient is  INPATIENT. Inpatient status is judged to be reasonable and necessary in order to provide the required intensity of service to ensure the patient's safety. The patient's presenting symptoms, physical exam findings, and initial radiographic and laboratory data in the context of their chronic comorbidities is felt to place them at high risk for further clinical deterioration. Furthermore, it is not anticipated that the patient will be medically stable for discharge from the hospital within 2 midnights of admission.   * I certify that at the point of admission it is my clinical judgment that the patient will require inpatient hospital care spanning beyond 2 midnights from the point of admission due to high intensity of service, high risk for further deterioration and high frequency of surveillance required.*  Author: Clelia Croft, DO 07/26/2022 2:04 AM  For on call review [email or web address].

## 2022-07-25 NOTE — ED Notes (Addendum)
Per Denton Lank, MD if patient's type and screen and crossmatch are not finished at this time he requests one unit of emergent blood to be released and administered to patient. This nurse called blood bank, blood bank stated that the type and screen was not finished and that it would be about 20 more minutes. Per Steinl 1 unit of emergent blood is being picked up from blood bank to be administered asap.

## 2022-07-26 ENCOUNTER — Encounter (HOSPITAL_COMMUNITY): Payer: Self-pay

## 2022-07-26 ENCOUNTER — Other Ambulatory Visit: Payer: Self-pay

## 2022-07-26 DIAGNOSIS — N920 Excessive and frequent menstruation with regular cycle: Secondary | ICD-10-CM

## 2022-07-26 DIAGNOSIS — N922 Excessive menstruation at puberty: Secondary | ICD-10-CM | POA: Diagnosis not present

## 2022-07-26 DIAGNOSIS — D72829 Elevated white blood cell count, unspecified: Secondary | ICD-10-CM

## 2022-07-26 DIAGNOSIS — N939 Abnormal uterine and vaginal bleeding, unspecified: Secondary | ICD-10-CM | POA: Diagnosis not present

## 2022-07-26 DIAGNOSIS — D649 Anemia, unspecified: Secondary | ICD-10-CM | POA: Diagnosis not present

## 2022-07-26 DIAGNOSIS — D696 Thrombocytopenia, unspecified: Secondary | ICD-10-CM | POA: Diagnosis not present

## 2022-07-26 LAB — CBC
HCT: 21.6 % — ABNORMAL LOW (ref 36.0–46.0)
HCT: 21.6 % — ABNORMAL LOW (ref 36.0–46.0)
HCT: 26.5 % — ABNORMAL LOW (ref 36.0–46.0)
Hemoglobin: 6.9 g/dL — CL (ref 12.0–15.0)
Hemoglobin: 7.1 g/dL — ABNORMAL LOW (ref 12.0–15.0)
Hemoglobin: 8.6 g/dL — ABNORMAL LOW (ref 12.0–15.0)
MCH: 26.7 pg (ref 26.0–34.0)
MCH: 27.3 pg (ref 26.0–34.0)
MCH: 27.1 pg (ref 26.0–34.0)
MCHC: 31.9 g/dL (ref 30.0–36.0)
MCHC: 32.9 g/dL (ref 30.0–36.0)
MCHC: 32.5 g/dL (ref 30.0–36.0)
MCV: 83.7 fL (ref 80.0–100.0)
MCV: 83.1 fL (ref 80.0–100.0)
MCV: 83.6 fL (ref 80.0–100.0)
Platelets: 25 10*3/uL — CL (ref 150–400)
Platelets: 30 10*3/uL — ABNORMAL LOW (ref 150–400)
Platelets: 32 10*3/uL — ABNORMAL LOW (ref 150–400)
RBC: 2.58 MIL/uL — ABNORMAL LOW (ref 3.87–5.11)
RBC: 2.6 MIL/uL — ABNORMAL LOW (ref 3.87–5.11)
RBC: 3.17 MIL/uL — ABNORMAL LOW (ref 3.87–5.11)
RDW: 15.2 % (ref 11.5–15.5)
RDW: 15 % (ref 11.5–15.5)
RDW: 15 % (ref 11.5–15.5)
WBC: 12.7 10*3/uL — ABNORMAL HIGH (ref 4.0–10.5)
WBC: 13.4 10*3/uL — ABNORMAL HIGH (ref 4.0–10.5)
WBC: 17.9 10*3/uL — ABNORMAL HIGH (ref 4.0–10.5)
nRBC: 0 % (ref 0.0–0.2)
nRBC: 0.2 % (ref 0.0–0.2)
nRBC: 0.1 % (ref 0.0–0.2)

## 2022-07-26 LAB — TECHNOLOGIST SMEAR REVIEW: Plt Morphology: DECREASED

## 2022-07-26 LAB — DIFFERENTIAL
Abs Immature Granulocytes: 0.08 10*3/uL — ABNORMAL HIGH (ref 0.00–0.07)
Basophils Absolute: 0 10*3/uL (ref 0.0–0.1)
Basophils Relative: 0 %
Eosinophils Absolute: 0.1 10*3/uL (ref 0.0–0.5)
Eosinophils Relative: 1 %
Immature Granulocytes: 1 %
Lymphocytes Relative: 25 %
Lymphs Abs: 2.2 10*3/uL (ref 0.7–4.0)
Monocytes Absolute: 0.4 10*3/uL (ref 0.1–1.0)
Monocytes Relative: 4 %
Neutro Abs: 6.1 10*3/uL (ref 1.7–7.7)
Neutrophils Relative %: 69 %

## 2022-07-26 LAB — PREPARE RBC (CROSSMATCH)

## 2022-07-26 LAB — HIV ANTIBODY (ROUTINE TESTING W REFLEX): HIV Screen 4th Generation wRfx: NONREACTIVE

## 2022-07-26 LAB — HEPATITIS C ANTIBODY: HCV Ab: NONREACTIVE

## 2022-07-26 MED ORDER — CHLORHEXIDINE GLUCONATE CLOTH 2 % EX PADS: 6.0000 | MEDICATED_PAD | Freq: Every day | CUTANEOUS | Status: DC

## 2022-07-26 MED ORDER — SODIUM CHLORIDE 0.9% IV SOLUTION: Freq: Once | INTRAVENOUS | Status: AC

## 2022-07-26 MED ORDER — STERILE WATER FOR INJECTION IJ SOLN
INTRAMUSCULAR | Status: AC
  Administered 2022-07-26: 10 mL
  Filled 2022-07-26: qty 10

## 2022-07-26 MED ORDER — ORAL CARE MOUTH RINSE: 15.0000 mL | OROMUCOSAL | Status: DC | PRN

## 2022-07-26 NOTE — Progress Notes (Signed)
Pt stable at time of transfer. Report given. Pt's husband & dad at bedside.

## 2022-07-26 NOTE — Progress Notes (Signed)
PROGRESS NOTE    Daisy Meyers  NWG:[id number] DOB: 07-13-1996 DOA: 07/25/2022 PCP: [name], NP   Brief Narrative:  26 year old female with no significant past medical history presented with increased vaginal bleeding and abnormal labs.  On presentation, she was tachycardic with hemoglobin of 4.9 and platelets of 19.  ED provider discussed with GYN on-call who recommended a conjugated estrogen IV and tranexamic acid.  Hematology was consulted and patient was started on IV steroids.  She was transfused packed red cells.  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  Hematology following and patient is on IV Solu-Medrol.  Hematology recommends against platelet transfusion at this time.  Platelets improving to 30 today.  Monitor.  Microcytic anemia -Hemoglobin 4.9 on presentation.  Status post 3 units packed red cell transfusion.  Monitor H&H.  Aim to keep hemoglobin around 8. -Possibly from heavy menses.  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  Outpatient follow-up with GYN.  Pelvic ultrasound negative for any abnormality.  Leukocytosis -Possibly reactive  Obesity -Outpatient follow-up    DVT prophylaxis: SCDs Code Status: Full Family Communication: Father at bedside Disposition Plan: Status is: Inpatient Remains inpatient appropriate because: Of severity of illness    Consultants: ED provider consulted GYN.  Hematology  Procedures: None  Antimicrobials: None   Subjective: Patient seen and examined at bedside.  Vaginal bleeding is improving.  Denies worsening shortness of breath, fever, vomiting or chest pain.  Objective: Vitals:   07/26/22 0800 07/26/22 0900 07/26/22 1000 07/26/22 1010  BP: 124/70 137/75  (!) 148/90  Pulse: 85 88 90 97  Resp: 17 19 18 14   Temp:    97.7 F (36.5 C)  TempSrc:     Axillary  SpO2: 100% 100% 99% 100%  Weight:      Height:        Intake/Output Summary (Last 24 hours) at 07/26/2022 1056 Last data filed at 07/26/2022 0130 Gross per 24 hour  Intake 609.67 ml  Output --  Net 609.67 ml   Filed Weights   07/25/22 1505 07/25/22 2050  Weight: 108.9 kg 101.9 kg    Examination:  General exam: Appears calm and comfortable.  Currently on room air. Respiratory system: Bilateral decreased breath sounds at bases Cardiovascular system: S1 & S2 heard, Rate controlled Gastrointestinal system: Abdomen is obese, nondistended, soft and nontender. Normal bowel sounds heard. Extremities: No cyanosis, clubbing, edema  Central nervous system: Alert and oriented. No focal neurological deficits. Moving extremities Skin: No rashes, lesions or ulcers Psychiatry: Judgement and insight appear normal. Mood & affect appropriate.     Data Reviewed: I have personally reviewed following labs and imaging studies  CBC: Recent Labs  Lab 07/25/22 1730 07/26/22 0239 07/26/22 0641  WBC 9.0 12.7* 13.4*  NEUTROABS 6.1  --   --   HGB 4.9* 6.9* 7.1*  HCT 15.5* 21.6* 21.6*  MCV 79.1* 83.7 83.1  PLT 19* 25* 30*   Basic Metabolic Panel: Recent Labs  Lab 07/25/22 1730  NA 138  K 3.6  CL 108  CO2 22  GLUCOSE 105*  BUN 10  CREATININE 0.96  CALCIUM 8.3*   GFR: Estimated Creatinine Clearance: 104.2 mL/min (by C-G formula based on SCr of 0.96 mg/dL). Liver Function Tests: Recent Labs  Lab 07/25/22 1730  AST 16  ALT 15  ALKPHOS 62  BILITOT 0.5  PROT 6.2*  ALBUMIN  3.3*   No results for input(s): "LIPASE", "AMYLASE" in the last 168 hours. No results for input(s): "AMMONIA" in the last 168 hours. Coagulation Profile: Recent Labs  Lab 07/25/22 1730  INR 1.1   Cardiac Enzymes: No results for input(s): "CKTOTAL", "CKMB", "CKMBINDEX", "TROPONINI" in the last 168 hours. BNP (last 3 results) No results for input(s): "PROBNP" in the last 8760 hours. HbA1C: No  results for input(s): "HGBA1C" in the last 72 hours. CBG: No results for input(s): "GLUCAP" in the last 168 hours. Lipid Profile: No results for input(s): "CHOL", "HDL", "LDLCALC", "TRIG", "CHOLHDL", "LDLDIRECT" in the last 72 hours. Thyroid Function Tests: No results for input(s): "TSH", "T4TOTAL", "FREET4", "T3FREE", "THYROIDAB" in the last 72 hours. Anemia Panel: Recent Labs    07/25/22 1730  VITAMINB12 448  FOLATE 8.3  FERRITIN 5*  TIBC 385  IRON 115  RETICCTPCT 3.1   Sepsis Labs: No results for input(s): "PROCALCITON", "LATICACIDVEN" in the last 168 hours.  No results found for this or any previous visit (from the past 240 hour(s)).       Radiology Studies: US Pelvis Complete  Result Date: 07/25/2022 CLINICAL DATA:  Menorrhagia.  IUD. EXAM: TRANSABDOMINAL AND TRANSVAGINAL ULTRASOUND OF PELVIS TECHNIQUE: Both transabdominal and transvaginal ultrasound examinations of the pelvis were performed. Transabdominal technique was performed for global imaging of the pelvis including uterus, ovaries, adnexal regions, and pelvic cul-de-sac. It was necessary to proceed with endovaginal exam following the transabdominal exam to visualize the endometrium and ovaries. COMPARISON:  None Available. FINDINGS: Uterus Measurements: 6.5 x 2.9 x 4.3 cm = volume: 42.6 mL. No fibroids or other mass visualized. Endometrium Thickness: 5 mm. IUD seen in expected location in the endometrial cavity. Right ovary Measurements: 2.8 x 2.1 x 2.7 cm = volume: 8.1 mL. Normal appearance/no adnexal mass. Left ovary Measurements: 2.6 x 2.4 x 2.3 cm = volume: 7.7 mL. Normal appearance/no adnexal mass. Other findings No abnormal free fluid. IMPRESSION: IUD in expected location in the endometrial cavity. Otherwise normal appearance of uterus and both ovaries. No pelvic mass or other significant abnormality identified. Electronically Signed   By: Danae Orleans M.D.   On: 07/25/2022 20:46   US Transvaginal Non-OB  Result  Date: 07/25/2022 CLINICAL DATA:  Menorrhagia.  IUD. EXAM: TRANSABDOMINAL AND TRANSVAGINAL ULTRASOUND OF PELVIS TECHNIQUE: Both transabdominal and transvaginal ultrasound examinations of the pelvis were performed. Transabdominal technique was performed for global imaging of the pelvis including uterus, ovaries, adnexal regions, and pelvic cul-de-sac. It was necessary to proceed with endovaginal exam following the transabdominal exam to visualize the endometrium and ovaries. COMPARISON:  None Available. FINDINGS: Uterus Measurements: 6.5 x 2.9 x 4.3 cm = volume: 42.6 mL. No fibroids or other mass visualized. Endometrium Thickness: 5 mm. IUD seen in expected location in the endometrial cavity. Right ovary Measurements: 2.8 x 2.1 x 2.7 cm = volume: 8.1 mL. Normal appearance/no adnexal mass. Left ovary Measurements: 2.6 x 2.4 x 2.3 cm = volume: 7.7 mL. Normal appearance/no adnexal mass. Other findings No abnormal free fluid. IMPRESSION: IUD in expected location in the endometrial cavity. Otherwise normal appearance of uterus and both ovaries. No pelvic mass or other significant abnormality identified. Electronically Signed   By: Danae Orleans M.D.   On: 07/25/2022 20:46        Scheduled Meds:  Chlorhexidine Gluconate Cloth  6 each Topical Daily   conjugated estrogens  25 mg Intravenous Q6H   methylPREDNISolone (SOLU-MEDROL) injection  125 mg Intravenous Daily   Continuous Infusions:  Glade Lloyd, MD [facility] 07/26/2022, 10:56 AM

## 2022-07-26 NOTE — Consult Note (Signed)
Reason for the request:    Thrombocytopenia  HPI: I was asked by Dr. Ashok Cordia to evaluate Daisy Meyers for the evaluation of thrombocytopenia and severe anemia.  She is a 26 year old woman without any significant past medical history within the last week started developing heavy menstrual cycles that is heavy and starting Tuesday night all the way to Friday.  This was associated with excessive fatigue tiredness and dyspnea.  She also felt dizzy and tachycardic.  She was evaluated by her OB/GYN laboratory data showed a severe anemia and a platelet count of around 20.  She was referred to the emergency department for an evaluation.  CBC at that time showed a white cell count of 9.0, hemoglobin of 4.9, MCV 79 and a platelet count of 19.  Iron studies showed iron level of 115 and ferritin of less than 5.  Chemistries were all within normal range with normal bilirubin and electrolytes.  Her INR was normal and PTT was 20.  He has a negative pregnancy test.  Transvaginal ultrasound showed an IUD in place but no other acute pathology.  Based on these findings she was receiving transfusion of packed red cells and started on Solu-Medrol as well as estrogen IV as well as TXA.  Clinically, she reports feeling better overnight without any additional bleeding.  She has very little vaginal bleeding noted at this time.  She denies any hematochezia, melena and epistaxis or petechiae.  She denies any previous history of menorrhagia or blood disorder.  She does not report any headaches, blurry vision, syncope or seizures. Does not report any fevers, chills or sweats.  Does not report any cough, wheezing or hemoptysis.  Does not report any chest pain, palpitation, orthopnea or leg edema.  Does not report any nausea, vomiting or abdominal pain.  Does not report any constipation or diarrhea.  Does not report any skeletal complaints.    Does not report frequency, urgency or hematuria.  Does not report any skin rashes or lesions. Does  not report any heat or cold intolerance.  Does not report any lymphadenopathy or petechiae.  Does not report any anxiety or depression.  Remaining review of systems is negative.     Past Medical History:  Diagnosis Date   Depression   :   Past Surgical History:  Procedure Laterality Date   CYST EXCISION     pilonidinal   FOOT FOREIGN BODY REMOVAL  08/2016   FOREIGN BODY REMOVAL Left 08/15/2016   Procedure: REMOVAL FOREIGN BODY LEFT FOOT;  Surgeon: Altamese Kekoskee, MD;  Location: [facility];  Service: Orthopedics;  Laterality: Left;   WISDOM TOOTH EXTRACTION    :   Current Facility-Administered Medications:    0.9 %  sodium chloride infusion (Manually program via Guardrails IV Fluids), , Intravenous, Once, Sharion Settler, NP   Chlorhexidine Gluconate Cloth 2 % PADS 6 each, 6 each, Topical, Daily, Alekh, Kshitiz, MD   conjugated estrogens (PREMARIN) injection 25 mg, 25 mg, Intravenous, Q6H, Tu, Ching T, DO, 25 mg at 07/26/22 [id number]   methylPREDNISolone sodium succinate (SOLU-MEDROL) 125 mg/2 mL injection 125 mg, 125 mg, Intravenous, Daily, Tu, Ching T, DO:   Allergies  Allergen Reactions   No Known Allergies   :   Family History  Problem Relation Age of Onset   Heart disease Paternal Grandfather    Diabetes Maternal Aunt   :   Social History   Socioeconomic History   Marital status: Single    Spouse name: Not on file  Number of children: Not on file   Years of education: Not on file   Highest education level: Not on file  Occupational History   Not on file  Tobacco Use   Smoking status: Never   Smokeless tobacco: Never  Vaping Use   Vaping Use: Never used  Substance and Sexual Activity   Alcohol use: Yes    Comment: occ   Drug use: No   Sexual activity: Yes  Other Topics Concern   Not on file  Social History Narrative   Not on file   Social Determinants of Health   Financial Resource Strain: Not on file  Food Insecurity: Not on file  Transportation Needs:  Not on file  Physical Activity: Not on file  Stress: Not on file  Social Connections: Not on file  Intimate Partner Violence: Not on file  :  Pertinent items are noted in HPI.  Exam: Blood pressure 124/70, pulse 85, temperature 98 F (36.7 C), temperature source Oral, resp. rate 17, height 5\' 5"  (1.651 m), weight 224 lb 10.4 oz (101.9 kg), last menstrual period 07/16/2022, SpO2 100 %. General appearance: alert and cooperative appeared without distress. Head: atraumatic without any abnormalities. Eyes: conjunctivae/corneas clear. PERRL.  Sclera anicteric. Throat: lips, mucosa, and tongue normal; without oral thrush or ulcers. Resp: clear to auscultation bilaterally without rhonchi, wheezes or dullness to percussion. Cardio: regular rate and rhythm, S1, S2 normal, no murmur, click, rub or gallop GI: soft, non-tender; bowel sounds normal; no masses,  no organomegaly Skin: Skin color, texture, turgor normal. No rashes or lesions Lymph nodes: Cervical, supraclavicular, and axillary nodes normal. Neurologic: Grossly normal without any motor, sensory or deep tendon reflexes. Musculoskeletal: No joint deformity or effusion.   Recent Labs    07/26/22 0239 07/26/22 0641  WBC 12.7* 13.4*  HGB 6.9* 7.1*  HCT 21.6* 21.6*  PLT 25* 30*    Recent Labs    07/25/22 1730  NA 138  K 3.6  CL 108  CO2 22  GLUCOSE 105*  BUN 10  CREATININE 0.96  CALCIUM 8.3*      09/24/22 Pelvis Complete  Result Date: 07/25/2022 CLINICAL DATA:  Menorrhagia.  IUD. EXAM: TRANSABDOMINAL AND TRANSVAGINAL ULTRASOUND OF PELVIS TECHNIQUE: Both transabdominal and transvaginal ultrasound examinations of the pelvis were performed. Transabdominal technique was performed for global imaging of the pelvis including uterus, ovaries, adnexal regions, and pelvic cul-de-sac. It was necessary to proceed with endovaginal exam following the transabdominal exam to visualize the endometrium and ovaries. COMPARISON:  None Available.  FINDINGS: Uterus Measurements: 6.5 x 2.9 x 4.3 cm = volume: 42.6 mL. No fibroids or other mass visualized. Endometrium Thickness: 5 mm. IUD seen in expected location in the endometrial cavity. Right ovary Measurements: 2.8 x 2.1 x 2.7 cm = volume: 8.1 mL. Normal appearance/no adnexal mass. Left ovary Measurements: 2.6 x 2.4 x 2.3 cm = volume: 7.7 mL. Normal appearance/no adnexal mass. Other findings No abnormal free fluid. IMPRESSION: IUD in expected location in the endometrial cavity. Otherwise normal appearance of uterus and both ovaries. No pelvic mass or other significant abnormality identified. Electronically Signed   By: 09/24/2022 M.D.   On: 07/25/2022 20:46   09/24/2022 Transvaginal Non-OB  Result Date: 07/25/2022 CLINICAL DATA:  Menorrhagia.  IUD. EXAM: TRANSABDOMINAL AND TRANSVAGINAL ULTRASOUND OF PELVIS TECHNIQUE: Both transabdominal and transvaginal ultrasound examinations of the pelvis were performed. Transabdominal technique was performed for global imaging of the pelvis including uterus, ovaries, adnexal regions, and pelvic cul-de-sac. It was necessary  to proceed with endovaginal exam following the transabdominal exam to visualize the endometrium and ovaries. COMPARISON:  None Available. FINDINGS: Uterus Measurements: 6.5 x 2.9 x 4.3 cm = volume: 42.6 mL. No fibroids or other mass visualized. Endometrium Thickness: 5 mm. IUD seen in expected location in the endometrial cavity. Right ovary Measurements: 2.8 x 2.1 x 2.7 cm = volume: 8.1 mL. Normal appearance/no adnexal mass. Left ovary Measurements: 2.6 x 2.4 x 2.3 cm = volume: 7.7 mL. Normal appearance/no adnexal mass. Other findings No abnormal free fluid. IMPRESSION: IUD in expected location in the endometrial cavity. Otherwise normal appearance of uterus and both ovaries. No pelvic mass or other significant abnormality identified. Electronically Signed   By: Danae Orleans M.D.   On: 07/25/2022 20:46    Assessment and Plan:    26 year old woman  with:  1.  Acute thrombocytopenia with a platelet count of 19 with previous platelet count in 2021 was normal at 268.  She has also severe anemia with MCV of 79 and low ferritin.  No other abnormalities noted in her peripheral smear.  The differential diagnosis of her acute thrombocytopenia were discussed at this time.  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  TTP, HUS, DIC are unlikely.  From a management standpoint, I agree with steroid treatment at this time.  She is currently on Solu-Medrol and could be transitioned to prednisone.  I will defer adding IVIG at this time given the fact that her bleeding is subsided and her hemoglobin is responded to transfusion.  IVIG could be considered if her platelets do not respond and/or ongoing bleeding is detected.  Other agents to treat ITP including rituximab or platelet stimulating agents will be deferred if she developed refractory disease.  At this time, I recommended daily monitoring of her platelet count defer any platelet transfusion at this time.  I anticipate platelet recovery in the next 24 to 48 hours.  2.  Anemia: Related to heavy menstrual bleeding and appears to be improving with transfusion.  I agree with the continued transfusion till she is hemodynamically stable per the primary team.  The goal would be above globin of 8.  3.  Menorrhagia: Likely exacerbated by her ITP currently on estrogen treatment which have helped slow her bleeding.  4.  Disposition and follow-up: Anticipate need for hospitalization for the next 24 to 48 hours pending her platelet recovery.  We will arrange follow-up in the hematology clinic upon her discharge.  80  minutes were dedicated to this visit.  50% of time was face-to-face.  The time was spent on reviewing laboratory data, imaging studies, discussing treatment options, discussing differential diagnosis and answering questions regarding future plan.

## 2022-07-27 ENCOUNTER — Other Ambulatory Visit: Payer: Self-pay | Admitting: Oncology

## 2022-07-27 DIAGNOSIS — D696 Thrombocytopenia, unspecified: Secondary | ICD-10-CM

## 2022-07-27 DIAGNOSIS — N939 Abnormal uterine and vaginal bleeding, unspecified: Secondary | ICD-10-CM | POA: Diagnosis not present

## 2022-07-27 DIAGNOSIS — N922 Excessive menstruation at puberty: Secondary | ICD-10-CM | POA: Diagnosis not present

## 2022-07-27 DIAGNOSIS — D649 Anemia, unspecified: Secondary | ICD-10-CM | POA: Diagnosis not present

## 2022-07-27 DIAGNOSIS — D72829 Elevated white blood cell count, unspecified: Secondary | ICD-10-CM | POA: Diagnosis not present

## 2022-07-27 LAB — CBC WITH DIFFERENTIAL/PLATELET
Abs Immature Granulocytes: 0.25 10*3/uL — ABNORMAL HIGH (ref 0.00–0.07)
Basophils Absolute: 0 10*3/uL (ref 0.0–0.1)
Basophils Relative: 0 %
Eosinophils Absolute: 0 10*3/uL (ref 0.0–0.5)
Eosinophils Relative: 0 %
HCT: 23.6 % — ABNORMAL LOW (ref 36.0–46.0)
Hemoglobin: 7.9 g/dL — ABNORMAL LOW (ref 12.0–15.0)
Immature Granulocytes: 1 %
Lymphocytes Relative: 10 %
Lymphs Abs: 1.9 10*3/uL (ref 0.7–4.0)
MCH: 28.2 pg (ref 26.0–34.0)
MCHC: 33.5 g/dL (ref 30.0–36.0)
MCV: 84.3 fL (ref 80.0–100.0)
Monocytes Absolute: 0.9 10*3/uL (ref 0.1–1.0)
Monocytes Relative: 5 %
Neutro Abs: 16.5 10*3/uL — ABNORMAL HIGH (ref 1.7–7.7)
Neutrophils Relative %: 84 %
Platelets: 68 10*3/uL — ABNORMAL LOW (ref 150–400)
RBC: 2.8 MIL/uL — ABNORMAL LOW (ref 3.87–5.11)
RDW: 15.2 % (ref 11.5–15.5)
WBC: 19.6 10*3/uL — ABNORMAL HIGH (ref 4.0–10.5)
nRBC: 0.2 % (ref 0.0–0.2)

## 2022-07-27 LAB — BASIC METABOLIC PANEL
Anion gap: 5 (ref 5–15)
BUN: 14 mg/dL (ref 6–20)
CO2: 21 mmol/L — ABNORMAL LOW (ref 22–32)
Calcium: 8 mg/dL — ABNORMAL LOW (ref 8.9–10.3)
Chloride: 115 mmol/L — ABNORMAL HIGH (ref 98–111)
Creatinine, Ser: 0.81 mg/dL (ref 0.44–1.00)
GFR, Estimated: >60 mL/min (ref >60)
Glucose, Bld: 128 mg/dL — ABNORMAL HIGH (ref 70–99)
Potassium: 3.6 mmol/L (ref 3.5–5.1)
Sodium: 141 mmol/L (ref 135–145)

## 2022-07-27 LAB — MAGNESIUM: Magnesium: 2.5 mg/dL — ABNORMAL HIGH (ref 1.7–2.4)

## 2022-07-27 LAB — HEMOGLOBIN AND HEMATOCRIT, BLOOD
HCT: 29 % — ABNORMAL LOW (ref 36.0–46.0)
Hemoglobin: 9.5 g/dL — ABNORMAL LOW (ref 12.0–15.0)

## 2022-07-27 LAB — PREPARE RBC (CROSSMATCH)

## 2022-07-27 MED ORDER — PREDNISONE 20 MG PO TABS: 60.0000 mg | ORAL_TABLET | Freq: Every day | ORAL | 0 refills | Status: DC

## 2022-07-27 MED ORDER — SODIUM CHLORIDE 0.9% IV SOLUTION: Freq: Once | INTRAVENOUS | Status: AC

## 2022-07-27 MED ORDER — SODIUM CHLORIDE 0.9% FLUSH
10.0000 mL | INTRAVENOUS | Status: DC | PRN
Start: 2022-07-27 — End: 2022-07-28
  Administered 2022-07-27: 10 mL via INTRAVENOUS

## 2022-07-27 NOTE — Progress Notes (Signed)
  Transition of Care [facility]) Screening Note   Patient Details  Name: Daisy Meyers Date of Birth: Jul 28, 1996   Transition of Care [facility]) CM/SW Contact:    Amada Jupiter, LCSW Phone Number: 07/27/2022, 10:08 AM    Transition of Care Department [facility]) has reviewed patient and no TOC needs have been identified at this time. We will continue to monitor patient advancement through interdisciplinary progression rounds. If new patient transition needs arise, please place a TOC consult.

## 2022-07-27 NOTE — Progress Notes (Signed)
Reviewed written d/c instructions w pt and all questions answered. She verbalized understanding. D/C via w/c w all belongings in stable condition. 

## 2022-07-27 NOTE — Discharge Summary (Signed)
Physician Discharge Summary  Daisy Meyers GYI:[id number] DOB: Apr 01, 1996 DOA: 07/25/2022  PCP: Daisy Agee, NP  Admit date: 07/25/2022 Discharge date: 07/27/2022  Admitted From: Home Disposition: Home  Recommendations for Outpatient Follow-up:  Follow up with PCP in 1 week with repeat CBC Outpatient follow-up with oncology at earliest convenience Will need outpatient gynecology follow-up Follow up in ED if symptoms worsen or new appear   Home Health: No Equipment/Devices: None  Discharge Condition: Stable CODE STATUS: Full Diet recommendation: Regular  Brief/Interim Summary: 26 year old female with no significant past medical history presented with increased vaginal bleeding and abnormal labs.  On presentation, she was tachycardic with hemoglobin of 4.9 and platelets of 19.  ED provider discussed with GYN on-call who recommended a conjugated estrogen IV and tranexamic acid.  Hematology was consulted and patient was started on IV steroids.  She was transfused packed red cells. During the hospitalization, her condition has improved.  Platelets have improved to 68 today.  Hemoglobin 7.9 today.  She will be transfused 1 unit of packed red cells as per hematology recommendations.  Hematology has cleared the patient for discharge afterwards on prednisone 60 mg daily for now until patient sees hematology next week.  Patient will also need to continue oral iron supplementation.  She will be discharged home today after blood transfusion if her hemoglobin remains above 8.  Will need outpatient GYN follow-up as well.  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  Hematology following and patient is on IV Solu-Medrol.  Hematology recommended against platelet transfusion at this time.  Platelets improving to 68 today.   -Hematology has cleared the patient for discharge on prednisone 60 mg daily for now until patient sees hematology next  week.   Microcytic anemia -Hemoglobin 4.9 on presentation.  Status post 3 units packed red cell transfusion.  Aim to keep hemoglobin around 8. -Possibly from heavy menses. -Hemoglobin 7.9 today. - She will be transfused 1 unit of packed red cells as per hematology recommendations.  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   -Continue norethindrone on discharge. -Outpatient follow-up with GYN.  Pelvic ultrasound negative for any abnormality.   Leukocytosis -Possibly reactive   Obesity -Outpatient follow-up  Discharge Instructions  Discharge Instructions     Diet general   Complete by: As directed    Increase activity slowly   Complete by: As directed       Allergies as of 07/27/2022       Reactions   No Known Allergies         Medication List     STOP taking these medications    fluticasone 50 MCG/ACT nasal spray Commonly known as: FLONASE       TAKE these medications    IRON PO Take 1 tablet by mouth daily.   norethindrone 5 MG tablet Commonly known as: AYGESTIN Take 5 mg by mouth daily.   predniSONE 20 MG tablet Commonly known as: DELTASONE Take 3 tablets (60 mg total) by mouth daily with breakfast.        Follow-up Information     Daisy Agee, NP. Schedule an appointment as soon as possible for a visit in 1 week(s).   Specialty: Adult Health Nurse Practitioner Contact information: [address] [place] [address] [phone]  Benjiman Core, MD Follow up.   Specialty: Oncology Why: At earliest convenience Contact information: [address] [place] [address] [phone]                Allergies  Allergen Reactions   No Known Allergies     Consultations: ED provider curb sided GYN.  Hematology   Procedures/Studies: US Pelvis  Complete  Result Date: 07/25/2022 CLINICAL DATA:  Menorrhagia.  IUD. EXAM: TRANSABDOMINAL AND TRANSVAGINAL ULTRASOUND OF PELVIS TECHNIQUE: Both transabdominal and transvaginal ultrasound examinations of the pelvis were performed. Transabdominal technique was performed for global imaging of the pelvis including uterus, ovaries, adnexal regions, and pelvic cul-de-sac. It was necessary to proceed with endovaginal exam following the transabdominal exam to visualize the endometrium and ovaries. COMPARISON:  None Available. FINDINGS: Uterus Measurements: 6.5 x 2.9 x 4.3 cm = volume: 42.6 mL. No fibroids or other mass visualized. Endometrium Thickness: 5 mm. IUD seen in expected location in the endometrial cavity. Right ovary Measurements: 2.8 x 2.1 x 2.7 cm = volume: 8.1 mL. Normal appearance/no adnexal mass. Left ovary Measurements: 2.6 x 2.4 x 2.3 cm = volume: 7.7 mL. Normal appearance/no adnexal mass. Other findings No abnormal free fluid. IMPRESSION: IUD in expected location in the endometrial cavity. Otherwise normal appearance of uterus and both ovaries. No pelvic mass or other significant abnormality identified. Electronically Signed   By: Danae Orleans M.D.   On: 07/25/2022 20:46   US Transvaginal Non-OB  Result Date: 07/25/2022 CLINICAL DATA:  Menorrhagia.  IUD. EXAM: TRANSABDOMINAL AND TRANSVAGINAL ULTRASOUND OF PELVIS TECHNIQUE: Both transabdominal and transvaginal ultrasound examinations of the pelvis were performed. Transabdominal technique was performed for global imaging of the pelvis including uterus, ovaries, adnexal regions, and pelvic cul-de-sac. It was necessary to proceed with endovaginal exam following the transabdominal exam to visualize the endometrium and ovaries. COMPARISON:  None Available. FINDINGS: Uterus Measurements: 6.5 x 2.9 x 4.3 cm = volume: 42.6 mL. No fibroids or other mass visualized. Endometrium Thickness: 5 mm. IUD seen in expected location in the endometrial cavity. Right  ovary Measurements: 2.8 x 2.1 x 2.7 cm = volume: 8.1 mL. Normal appearance/no adnexal mass. Left ovary Measurements: 2.6 x 2.4 x 2.3 cm = volume: 7.7 mL. Normal appearance/no adnexal mass. Other findings No abnormal free fluid. IMPRESSION: IUD in expected location in the endometrial cavity. Otherwise normal appearance of uterus and both ovaries. No pelvic mass or other significant abnormality identified. Electronically Signed   By: Danae Orleans M.D.   On: 07/25/2022 20:46      Subjective: Patient seen and examined at bedside.  Denies worsening shortness of breath, nausea, vomiting or fever.  Feels okay to go home today.  Discharge Exam: Vitals:   07/27/22 0143 07/27/22 0410  BP: (!) 113/54 118/60  Pulse: 97 82  Resp: 18 18  Temp: 97.8 F (36.6 C) 97.7 F (36.5 C)  SpO2: 99% 100%    General: Pt is alert, awake, not in acute distress.  Currently on room air. Cardiovascular: rate controlled, S1/S2 + Respiratory: bilateral decreased breath sounds at bases Abdominal: Soft, obese, NT, ND, bowel sounds + Extremities: Trace lower extremity edema, no cyanosis    The results of significant diagnostics from this hospitalization (including imaging, microbiology, ancillary and laboratory) are listed below for reference.     Microbiology: No results found for this or any previous visit (from the past 240 hour(s)).   Labs: BNP (last 3 results) No results for input(s): "BNP" in the last 8760  hours. Basic Metabolic Panel: Recent Labs  Lab 07/25/22 1730 07/27/22 0445  NA 138 141  K 3.6 3.6  CL 108 115*  CO2 22 21*  GLUCOSE 105* 128*  BUN 10 14  CREATININE 0.96 0.81  CALCIUM 8.3* 8.0*  MG  --  2.5*   Liver Function Tests: Recent Labs  Lab 07/25/22 1730  AST 16  ALT 15  ALKPHOS 62  BILITOT 0.5  PROT 6.2*  ALBUMIN 3.3*   No results for input(s): "LIPASE", "AMYLASE" in the last 168 hours. No results for input(s): "AMMONIA" in the last 168 hours. CBC: Recent Labs  Lab  07/25/22 1730 07/26/22 0239 07/26/22 0641 07/26/22 0939 07/27/22 0445  WBC 9.0 12.7* 13.4* 17.9* 19.6*  NEUTROABS 6.1  --   --   --  16.5*  HGB 4.9* 6.9* 7.1* 8.6* 7.9*  HCT 15.5* 21.6* 21.6* 26.5* 23.6*  MCV 79.1* 83.7 83.1 83.6 84.3  PLT 19* 25* 30* 32* 68*   Cardiac Enzymes: No results for input(s): "CKTOTAL", "CKMB", "CKMBINDEX", "TROPONINI" in the last 168 hours. BNP: Invalid input(s): "POCBNP" CBG: No results for input(s): "GLUCAP" in the last 168 hours. D-Dimer No results for input(s): "DDIMER" in the last 72 hours. Hgb A1c No results for input(s): "HGBA1C" in the last 72 hours. Lipid Profile No results for input(s): "CHOL", "HDL", "LDLCALC", "TRIG", "CHOLHDL", "LDLDIRECT" in the last 72 hours. Thyroid function studies No results for input(s): "TSH", "T4TOTAL", "T3FREE", "THYROIDAB" in the last 72 hours.  Invalid input(s): "FREET3" Anemia work up Recent Labs    07/25/22 1730  VITAMINB12 448  FOLATE 8.3  FERRITIN 5*  TIBC 385  IRON 115  RETICCTPCT 3.1   Urinalysis    Component Value Date/Time   BILIRUBINUR negative 01/01/2018 1508   KETONESUR negative 01/01/2018 1508   PROTEINUR negative 01/01/2018 1508   UROBILINOGEN 0.2 01/01/2018 1508   NITRITE Negative 01/01/2018 1508   LEUKOCYTESUR Negative 01/01/2018 1508   Sepsis Labs Recent Labs  Lab 07/26/22 0239 07/26/22 0641 07/26/22 0939 07/27/22 0445  WBC 12.7* 13.4* 17.9* 19.6*   Microbiology No results found for this or any previous visit (from the past 240 hour(s)).   Time coordinating discharge: 35 minutes  SIGNED:   Glade Lloyd, MD  [facility] 07/27/2022, 11:52 AM

## 2022-07-27 NOTE — Progress Notes (Signed)
IP PROGRESS NOTE  Subjective:   Daisy Meyers reports feeling well overall.  She does report some fatigue and tiredness but no other complaints.  She denies any chest pain or shortness of breath.  Her menstrual bleeding has decreased but has not completely stopped.  Objective:  Vital signs in last 24 hours: Temp:  [97.7 F (36.5 C)-98.4 F (36.9 C)] 97.7 F (36.5 C) (08/13 0410) Pulse Rate:  [82-101] 82 (08/13 0410) Resp:  [14-19] 18 (08/13 0410) BP: (108-148)/(54-93) 118/60 (08/13 0410) SpO2:  [99 %-100 %] 100 % (08/13 0410) Weight change:  Last BM Date : 07/26/22  Intake/Output from previous day: 08/12 0701 - 08/13 0700 In: 1270 [P.O.:1270] Out: 0  General: Alert, awake without distress. Head: Normocephalic atraumatic. Mouth: mucous membranes moist, pharynx normal without lesions Eyes: No scleral icterus.  Pupils are equal and round reactive to light. Resp: clear to auscultation bilaterally without rhonchi or wheezes or dullness to percussion. Cardio: regular rate and rhythm, S1, S2 normal, no murmur, click, rub or gallop GI: soft, non-tender; bowel sounds normal; no masses,  no organomegaly Musculoskeletal: No joint deformity or effusion. Neurological: No motor, sensory deficits.  Intact deep tendon reflexes. Skin: No rashes or lesions.    Lab Results: Recent Labs    07/26/22 0939 07/27/22 0445  WBC 17.9* 19.6*  HGB 8.6* 7.9*  HCT 26.5* 23.6*  PLT 32* 68*    BMET Recent Labs    07/25/22 1730 07/27/22 0445  NA 138 141  K 3.6 3.6  CL 108 115*  CO2 22 21*  GLUCOSE 105* 128*  BUN 10 14  CREATININE 0.96 0.81  CALCIUM 8.3* 8.0*     Medications: I have reviewed the patient's current medications.  Assessment/Plan:  26 year old woman with:   1.  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  Laboratory data from today showed improvement up to 68.    Management options moving forward were discussed.  Addition of IVIG  is felt to be not needed given her quick recovery.  I recommended switching to prednisone as she is preparing for outpatient follow-up.  Different salvage therapy including rituximab, Nplate and possible splenectomy will be deferred at this time   2.  Anemia: Due to heavy menstrual cycles and hemoglobin currently at around 7.9.  I recommended continuing oral iron upon discharge given her blood losses.  Goal of hemoglobin around 8.   3.  Menorrhagia: No acute pathology noted on ultrasound.  Her bleeding is likely exacerbated by ITP.  I still recommend to follow-up with gynecology in case the future episodes of ITP recur.   4.  Disposition and follow-up: I do not have any objections to discharge and will arrange follow-up next week for follow-up on her counts.   35  minutes were spent on this encounter.  50% of time was face-to-face and dedicated to reviewing laboratory data, ultrasound results, future plan of care review and answering questions regarding her treatment.  LOS: 2 days   Daisy Meyers 07/27/2022, 7:48 AM

## 2022-07-28 ENCOUNTER — Encounter: Payer: Self-pay | Admitting: Oncology

## 2022-07-28 LAB — TYPE AND SCREEN
ABO/RH(D): O POS
Antibody Screen: NEGATIVE
Unit division: 0
Unit division: 0
Unit division: 0
Unit division: 0

## 2022-07-28 LAB — BPAM RBC
Blood Product Expiration Date: 202308232359
Blood Product Expiration Date: 202309072359
Blood Product Expiration Date: 202309092359
Blood Product Expiration Date: 202309092359
ISSUE DATE / TIME: 202308111834
ISSUE DATE / TIME: 202308112110
ISSUE DATE / TIME: 202308120549
ISSUE DATE / TIME: 202308131355
Unit Type and Rh: 5100
Unit Type and Rh: 5100
Unit Type and Rh: 9500
Unit Type and Rh: 9500

## 2022-08-01 ENCOUNTER — Inpatient Hospital Stay (HOSPITAL_BASED_OUTPATIENT_CLINIC_OR_DEPARTMENT_OTHER): Payer: BC Managed Care – PPO | Admitting: Oncology

## 2022-08-01 ENCOUNTER — Inpatient Hospital Stay: Payer: BC Managed Care – PPO | Attending: Oncology

## 2022-08-01 ENCOUNTER — Other Ambulatory Visit: Payer: Self-pay

## 2022-08-01 VITALS — BP 139/73 | HR 89 | Temp 97.9°F | Resp 15 | Wt 236.6 lb

## 2022-08-01 DIAGNOSIS — D649 Anemia, unspecified: Secondary | ICD-10-CM

## 2022-08-01 DIAGNOSIS — Z7952 Long term (current) use of systemic steroids: Secondary | ICD-10-CM | POA: Diagnosis not present

## 2022-08-01 DIAGNOSIS — Z9081 Acquired absence of spleen: Secondary | ICD-10-CM | POA: Insufficient documentation

## 2022-08-01 DIAGNOSIS — D696 Thrombocytopenia, unspecified: Secondary | ICD-10-CM | POA: Diagnosis not present

## 2022-08-01 DIAGNOSIS — Z79899 Other long term (current) drug therapy: Secondary | ICD-10-CM | POA: Insufficient documentation

## 2022-08-01 DIAGNOSIS — D693 Immune thrombocytopenic purpura: Secondary | ICD-10-CM | POA: Diagnosis not present

## 2022-08-01 DIAGNOSIS — N920 Excessive and frequent menstruation with regular cycle: Secondary | ICD-10-CM | POA: Diagnosis not present

## 2022-08-01 LAB — CBC WITH DIFFERENTIAL (CANCER CENTER ONLY)
Abs Immature Granulocytes: 0.05 10*3/uL (ref 0.00–0.07)
Basophils Absolute: 0 10*3/uL (ref 0.0–0.1)
Basophils Relative: 0 %
Eosinophils Absolute: 0.1 10*3/uL (ref 0.0–0.5)
Eosinophils Relative: 1 %
HCT: 31.3 % — ABNORMAL LOW (ref 36.0–46.0)
Hemoglobin: 10.5 g/dL — ABNORMAL LOW (ref 12.0–15.0)
Immature Granulocytes: 0 %
Lymphocytes Relative: 40 %
Lymphs Abs: 6 10*3/uL — ABNORMAL HIGH (ref 0.7–4.0)
MCH: 27.5 pg (ref 26.0–34.0)
MCHC: 33.5 g/dL (ref 30.0–36.0)
MCV: 81.9 fL (ref 80.0–100.0)
Monocytes Absolute: 0.6 10*3/uL (ref 0.1–1.0)
Monocytes Relative: 4 %
Neutro Abs: 8.1 10*3/uL — ABNORMAL HIGH (ref 1.7–7.7)
Neutrophils Relative %: 55 %
Platelet Count: 180 10*3/uL (ref 150–400)
RBC: 3.82 MIL/uL — ABNORMAL LOW (ref 3.87–5.11)
RDW: 15.1 % (ref 11.5–15.5)
WBC Count: 14.9 10*3/uL — ABNORMAL HIGH (ref 4.0–10.5)
nRBC: 0 % (ref 0.0–0.2)

## 2022-08-01 LAB — IRON AND IRON BINDING CAPACITY (CC-WL,HP ONLY)
Iron: 109 ug/dL (ref 28–170)
Saturation Ratios: 26 % (ref 10.4–31.8)
TIBC: 421 ug/dL (ref 250–450)
UIBC: 312 ug/dL (ref 148–442)

## 2022-08-01 LAB — FERRITIN: Ferritin: 9 ng/mL — ABNORMAL LOW (ref 11–307)

## 2022-08-01 NOTE — Progress Notes (Signed)
Hematology and Oncology Follow Up Visit  Hazle Ogburn [id number] 1996-09-03 26 y.o. 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  She presented with heavy menstrual bleeding and platelet count of 20.   Prior Therapy: She received packed red cell transfusion as well as IV Solu-Medrol during hospitalization on July 25, 2022.  Current therapy: Prednisone 60 mg daily.  Interim History: Ms. Budreau returns today for a follow-up visit.  Since her discharge, she feels well without any major complaints.  She has tolerated prednisone without any complications.  She denies any nausea, vomiting or excessive bleeding.  She denies any bruising or menorrhagia.  Performance status quality of life remained excellent.     Medications: I have reviewed the patient's current medications.  Current Outpatient Medications  Medication Sig Dispense Refill   Ferrous Sulfate (IRON PO) Take 1 tablet by mouth daily.     norethindrone (AYGESTIN) 5 MG tablet Take 5 mg by mouth daily.     predniSONE (DELTASONE) 20 MG tablet Take 3 tablets (60 mg total) by mouth daily with breakfast. 30 tablet 0   No current facility-administered medications for this visit.     Allergies:  Allergies  Allergen Reactions   No Known Allergies      Physical Exam:  ECOG:     General appearance: Comfortable appearing without any discomfort Head: Normocephalic without any trauma Oropharynx: Mucous membranes are moist and pink without any thrush or ulcers. Eyes: Pupils are equal and round reactive to light. Lymph nodes: No cervical, supraclavicular, inguinal or axillary lymphadenopathy.   Heart:regular rate and rhythm.  S1 and S2 without leg edema. Lung: Clear without any rhonchi or wheezes.  No dullness to percussion. Abdomin: Soft, nontender, nondistended with good bowel sounds.  No hepatosplenomegaly. Musculoskeletal: No joint  deformity or effusion.  Full range of motion noted. Neurological: No deficits noted on motor, sensory and deep tendon reflex exam. Skin: No petechial rash or dryness.  Appeared moist.     Lab Results: Lab Results  Component Value Date   WBC 19.6 (H) 07/27/2022   HGB 9.5 (L) 07/27/2022   HCT 29.0 (L) 07/27/2022   MCV 84.3 07/27/2022   PLT 68 (L) 07/27/2022     Chemistry      Component Value Date/Time   NA 141 07/27/2022 0445   NA 135 05/25/2020 1643   K 3.6 07/27/2022 0445   CL 115 (H) 07/27/2022 0445   CO2 21 (L) 07/27/2022 0445   BUN 14 07/27/2022 0445   BUN 8 05/25/2020 1643   CREATININE 0.81 07/27/2022 0445      Component Value Date/Time   CALCIUM 8.0 (L) 07/27/2022 0445   ALKPHOS 62 07/25/2022 1730   AST 16 07/25/2022 1730   ALT 15 07/25/2022 1730   BILITOT 0.5 07/25/2022 1730   BILITOT 0.4 05/25/2020 1643         Impression and Plan:  26 year old with:  1.  Severe thrombocytopenia consistent with acute ITP after presenting with platelet count of 20.  She was started on Solu-Medrol with excellent response to her platelet count up to 68 on July 27, 2022.  The natural course of this disease was reviewed at this time and treatment options were discussed.  Salvage therapy options that include IVIG, rituximab, splenectomy is reiterated.  Stimulating factor can also be considered second or third line therapy.  Laboratory data from today reviewed and showed a platelet count of 180 and  have recommended starting prednisone taper by 10 mg a week.   2.  Anemia: Related to acute blood loss and received packed red cell transfusion with hemoglobin up to 9.5 on August 13.  I recommended continued oral iron at this time.  3.  Follow-up: Continue to follow weekly for laboratory check and MD follow-up in the next 4 to 5  weeks.  30  minutes were dedicated to this visit. The time was spent on reviewing laboratory data, discussing treatment options, discussing complications  related to therapy and answering questions regarding future plan.    [name], MD 8/18/20238:48 AM

## 2022-08-04 ENCOUNTER — Encounter: Payer: Self-pay | Admitting: Oncology

## 2022-08-05 ENCOUNTER — Other Ambulatory Visit: Payer: Self-pay | Admitting: *Deleted

## 2022-08-05 MED ORDER — PREDNISONE 10 MG PO TABS: 10.0000 mg | ORAL_TABLET | Freq: Every day | ORAL | 0 refills | Status: DC

## 2022-08-08 ENCOUNTER — Telehealth: Payer: Self-pay | Admitting: *Deleted

## 2022-08-08 ENCOUNTER — Inpatient Hospital Stay: Payer: BC Managed Care – PPO

## 2022-08-08 DIAGNOSIS — D696 Thrombocytopenia, unspecified: Secondary | ICD-10-CM

## 2022-08-08 DIAGNOSIS — D693 Immune thrombocytopenic purpura: Secondary | ICD-10-CM | POA: Diagnosis not present

## 2022-08-08 DIAGNOSIS — Z7952 Long term (current) use of systemic steroids: Secondary | ICD-10-CM | POA: Diagnosis not present

## 2022-08-08 DIAGNOSIS — M25561 Pain in right knee: Secondary | ICD-10-CM | POA: Diagnosis not present

## 2022-08-08 DIAGNOSIS — Z9081 Acquired absence of spleen: Secondary | ICD-10-CM | POA: Diagnosis not present

## 2022-08-08 DIAGNOSIS — N920 Excessive and frequent menstruation with regular cycle: Secondary | ICD-10-CM | POA: Diagnosis not present

## 2022-08-08 DIAGNOSIS — D649 Anemia, unspecified: Secondary | ICD-10-CM | POA: Diagnosis not present

## 2022-08-08 DIAGNOSIS — Z79899 Other long term (current) drug therapy: Secondary | ICD-10-CM | POA: Diagnosis not present

## 2022-08-08 LAB — CBC WITH DIFFERENTIAL (CANCER CENTER ONLY)
Abs Immature Granulocytes: 0.04 10*3/uL (ref 0.00–0.07)
Basophils Absolute: 0 10*3/uL (ref 0.0–0.1)
Basophils Relative: 0 %
Eosinophils Absolute: 0 10*3/uL (ref 0.0–0.5)
Eosinophils Relative: 0 %
HCT: 36 % (ref 36.0–46.0)
Hemoglobin: 11.9 g/dL — ABNORMAL LOW (ref 12.0–15.0)
Immature Granulocytes: 0 %
Lymphocytes Relative: 8 %
Lymphs Abs: 1.1 10*3/uL (ref 0.7–4.0)
MCH: 27.4 pg (ref 26.0–34.0)
MCHC: 33.1 g/dL (ref 30.0–36.0)
MCV: 82.8 fL (ref 80.0–100.0)
Monocytes Absolute: 0.1 10*3/uL (ref 0.1–1.0)
Monocytes Relative: 1 %
Neutro Abs: 12.2 10*3/uL — ABNORMAL HIGH (ref 1.7–7.7)
Neutrophils Relative %: 91 %
Platelet Count: 158 10*3/uL (ref 150–400)
RBC: 4.35 MIL/uL (ref 3.87–5.11)
RDW: 15.3 % (ref 11.5–15.5)
WBC Count: 13.4 10*3/uL — ABNORMAL HIGH (ref 4.0–10.5)
nRBC: 0 % (ref 0.0–0.2)

## 2022-08-08 NOTE — Telephone Encounter (Signed)
Per Dr.Shadad, called pt with message below. Pt verbalized understanding ?

## 2022-08-08 NOTE — Telephone Encounter (Signed)
-----   Message from Benjiman Core, MD sent at 08/08/2022  2:00 PM EDT ----- Please let her know her platelets are good. She is to continue prednisone taper by 10 mg each week

## 2022-08-15 ENCOUNTER — Other Ambulatory Visit: Payer: Self-pay

## 2022-08-15 ENCOUNTER — Inpatient Hospital Stay: Payer: BC Managed Care – PPO | Attending: Oncology

## 2022-08-15 ENCOUNTER — Telehealth: Payer: Self-pay | Admitting: *Deleted

## 2022-08-15 DIAGNOSIS — Z7952 Long term (current) use of systemic steroids: Secondary | ICD-10-CM | POA: Insufficient documentation

## 2022-08-15 DIAGNOSIS — D693 Immune thrombocytopenic purpura: Secondary | ICD-10-CM | POA: Insufficient documentation

## 2022-08-15 DIAGNOSIS — D696 Thrombocytopenia, unspecified: Secondary | ICD-10-CM | POA: Insufficient documentation

## 2022-08-15 DIAGNOSIS — N920 Excessive and frequent menstruation with regular cycle: Secondary | ICD-10-CM | POA: Insufficient documentation

## 2022-08-15 DIAGNOSIS — M25561 Pain in right knee: Secondary | ICD-10-CM | POA: Diagnosis not present

## 2022-08-15 DIAGNOSIS — D649 Anemia, unspecified: Secondary | ICD-10-CM | POA: Diagnosis not present

## 2022-08-15 DIAGNOSIS — Z79899 Other long term (current) drug therapy: Secondary | ICD-10-CM | POA: Insufficient documentation

## 2022-08-15 DIAGNOSIS — Z793 Long term (current) use of hormonal contraceptives: Secondary | ICD-10-CM | POA: Diagnosis not present

## 2022-08-15 LAB — CBC WITH DIFFERENTIAL (CANCER CENTER ONLY)
Abs Immature Granulocytes: 0.05 10*3/uL (ref 0.00–0.07)
Basophils Absolute: 0 10*3/uL (ref 0.0–0.1)
Basophils Relative: 0 %
Eosinophils Absolute: 0 10*3/uL (ref 0.0–0.5)
Eosinophils Relative: 0 %
HCT: 36.8 % (ref 36.0–46.0)
Hemoglobin: 12.2 g/dL (ref 12.0–15.0)
Immature Granulocytes: 1 %
Lymphocytes Relative: 7 %
Lymphs Abs: 0.8 10*3/uL (ref 0.7–4.0)
MCH: 27.5 pg (ref 26.0–34.0)
MCHC: 33.2 g/dL (ref 30.0–36.0)
MCV: 82.9 fL (ref 80.0–100.0)
Monocytes Absolute: 0.1 10*3/uL (ref 0.1–1.0)
Monocytes Relative: 1 %
Neutro Abs: 10 10*3/uL — ABNORMAL HIGH (ref 1.7–7.7)
Neutrophils Relative %: 91 %
Platelet Count: 148 10*3/uL — ABNORMAL LOW (ref 150–400)
RBC: 4.44 MIL/uL (ref 3.87–5.11)
RDW: 15.8 % — ABNORMAL HIGH (ref 11.5–15.5)
WBC Count: 11 10*3/uL — ABNORMAL HIGH (ref 4.0–10.5)
nRBC: 0 % (ref 0.0–0.2)

## 2022-08-15 NOTE — Telephone Encounter (Signed)
-----   Message from Benjiman Core, MD sent at 08/15/2022  2:54 PM EDT ----- Please let her know to continue with prednisone taper as instructed. Platelets are still good.

## 2022-08-15 NOTE — Telephone Encounter (Signed)
PC to patient, no answer, left VM - informed of Dr Alver Fisher instructions as below, instructed patient to call this office with any questions/concerns, [phone].

## 2022-08-20 ENCOUNTER — Other Ambulatory Visit: Payer: Self-pay | Admitting: Oncology

## 2022-08-20 ENCOUNTER — Encounter: Payer: Self-pay | Admitting: Oncology

## 2022-08-20 DIAGNOSIS — D696 Thrombocytopenia, unspecified: Secondary | ICD-10-CM

## 2022-08-20 MED ORDER — PREDNISONE 10 MG PO TABS: 10.0000 mg | ORAL_TABLET | Freq: Every day | ORAL | 0 refills | Status: DC

## 2022-08-22 ENCOUNTER — Telehealth: Payer: Self-pay | Admitting: *Deleted

## 2022-08-22 ENCOUNTER — Inpatient Hospital Stay: Payer: BC Managed Care – PPO

## 2022-08-22 DIAGNOSIS — S83511A Sprain of anterior cruciate ligament of right knee, initial encounter: Secondary | ICD-10-CM | POA: Diagnosis not present

## 2022-08-22 DIAGNOSIS — D649 Anemia, unspecified: Secondary | ICD-10-CM

## 2022-08-22 DIAGNOSIS — D693 Immune thrombocytopenic purpura: Secondary | ICD-10-CM | POA: Diagnosis not present

## 2022-08-22 DIAGNOSIS — N920 Excessive and frequent menstruation with regular cycle: Secondary | ICD-10-CM | POA: Diagnosis not present

## 2022-08-22 DIAGNOSIS — D696 Thrombocytopenia, unspecified: Secondary | ICD-10-CM

## 2022-08-22 DIAGNOSIS — Z79899 Other long term (current) drug therapy: Secondary | ICD-10-CM | POA: Diagnosis not present

## 2022-08-22 DIAGNOSIS — Z793 Long term (current) use of hormonal contraceptives: Secondary | ICD-10-CM | POA: Diagnosis not present

## 2022-08-22 DIAGNOSIS — Z7952 Long term (current) use of systemic steroids: Secondary | ICD-10-CM | POA: Diagnosis not present

## 2022-08-22 LAB — CBC WITH DIFFERENTIAL (CANCER CENTER ONLY)
Abs Immature Granulocytes: 0.04 10*3/uL (ref 0.00–0.07)
Basophils Absolute: 0 10*3/uL (ref 0.0–0.1)
Basophils Relative: 0 %
Eosinophils Absolute: 0 10*3/uL (ref 0.0–0.5)
Eosinophils Relative: 0 %
HCT: 37.4 % (ref 36.0–46.0)
Hemoglobin: 12.3 g/dL (ref 12.0–15.0)
Immature Granulocytes: 0 %
Lymphocytes Relative: 8 %
Lymphs Abs: 0.9 10*3/uL (ref 0.7–4.0)
MCH: 27.7 pg (ref 26.0–34.0)
MCHC: 32.9 g/dL (ref 30.0–36.0)
MCV: 84.2 fL (ref 80.0–100.0)
Monocytes Absolute: 0.1 10*3/uL (ref 0.1–1.0)
Monocytes Relative: 1 %
Neutro Abs: 10 10*3/uL — ABNORMAL HIGH (ref 1.7–7.7)
Neutrophils Relative %: 91 %
Platelet Count: 130 10*3/uL — ABNORMAL LOW (ref 150–400)
RBC: 4.44 MIL/uL (ref 3.87–5.11)
RDW: 15.8 % — ABNORMAL HIGH (ref 11.5–15.5)
WBC Count: 11.1 10*3/uL — ABNORMAL HIGH (ref 4.0–10.5)
nRBC: 0 % (ref 0.0–0.2)

## 2022-08-22 LAB — IRON AND IRON BINDING CAPACITY (CC-WL,HP ONLY)
Iron: 353 ug/dL — ABNORMAL HIGH (ref 28–170)
Saturation Ratios: 83 % — ABNORMAL HIGH (ref 10.4–31.8)
TIBC: 427 ug/dL (ref 250–450)
UIBC: 74 ug/dL — ABNORMAL LOW (ref 148–442)

## 2022-08-22 LAB — FERRITIN: Ferritin: 16 ng/mL (ref 11–307)

## 2022-08-22 NOTE — Telephone Encounter (Signed)
LM with note below 

## 2022-08-22 NOTE — Telephone Encounter (Signed)
-----   Message from Benjiman Core, MD sent at 08/22/2022  2:04 PM EDT ----- Please let her know to continue prednisone taper as instructed.

## 2022-08-29 ENCOUNTER — Encounter: Payer: Self-pay | Admitting: Oncology

## 2022-08-29 ENCOUNTER — Telehealth: Payer: Self-pay | Admitting: *Deleted

## 2022-08-29 ENCOUNTER — Inpatient Hospital Stay: Payer: BC Managed Care – PPO

## 2022-08-29 DIAGNOSIS — D696 Thrombocytopenia, unspecified: Secondary | ICD-10-CM | POA: Diagnosis not present

## 2022-08-29 DIAGNOSIS — Z793 Long term (current) use of hormonal contraceptives: Secondary | ICD-10-CM | POA: Diagnosis not present

## 2022-08-29 DIAGNOSIS — D649 Anemia, unspecified: Secondary | ICD-10-CM

## 2022-08-29 DIAGNOSIS — Z7952 Long term (current) use of systemic steroids: Secondary | ICD-10-CM | POA: Diagnosis not present

## 2022-08-29 DIAGNOSIS — N920 Excessive and frequent menstruation with regular cycle: Secondary | ICD-10-CM | POA: Diagnosis not present

## 2022-08-29 DIAGNOSIS — Z79899 Other long term (current) drug therapy: Secondary | ICD-10-CM | POA: Diagnosis not present

## 2022-08-29 DIAGNOSIS — D693 Immune thrombocytopenic purpura: Secondary | ICD-10-CM | POA: Diagnosis not present

## 2022-08-29 DIAGNOSIS — S83511D Sprain of anterior cruciate ligament of right knee, subsequent encounter: Secondary | ICD-10-CM | POA: Diagnosis not present

## 2022-08-29 LAB — CBC WITH DIFFERENTIAL (CANCER CENTER ONLY)
Abs Immature Granulocytes: 0.04 10*3/uL (ref 0.00–0.07)
Basophils Absolute: 0 10*3/uL (ref 0.0–0.1)
Basophils Relative: 0 %
Eosinophils Absolute: 0 10*3/uL (ref 0.0–0.5)
Eosinophils Relative: 0 %
HCT: 39.9 % (ref 36.0–46.0)
Hemoglobin: 13.1 g/dL (ref 12.0–15.0)
Immature Granulocytes: 0 %
Lymphocytes Relative: 12 %
Lymphs Abs: 1.7 10*3/uL (ref 0.7–4.0)
MCH: 27.6 pg (ref 26.0–34.0)
MCHC: 32.8 g/dL (ref 30.0–36.0)
MCV: 84.2 fL (ref 80.0–100.0)
Monocytes Absolute: 0.2 10*3/uL (ref 0.1–1.0)
Monocytes Relative: 2 %
Neutro Abs: 12 10*3/uL — ABNORMAL HIGH (ref 1.7–7.7)
Neutrophils Relative %: 86 %
Platelet Count: 93 10*3/uL — ABNORMAL LOW (ref 150–400)
RBC: 4.74 MIL/uL (ref 3.87–5.11)
RDW: 15.5 % (ref 11.5–15.5)
WBC Count: 14 10*3/uL — ABNORMAL HIGH (ref 4.0–10.5)
nRBC: 0 % (ref 0.0–0.2)

## 2022-08-29 LAB — IRON AND IRON BINDING CAPACITY (CC-WL,HP ONLY)
Iron: 84 ug/dL (ref 28–170)
Saturation Ratios: 19 % (ref 10.4–31.8)
TIBC: 435 ug/dL (ref 250–450)
UIBC: 351 ug/dL

## 2022-08-29 LAB — FERRITIN: Ferritin: 23 ng/mL (ref 11–307)

## 2022-08-29 NOTE — Telephone Encounter (Signed)
-----   Message from Benjiman Core, MD sent at 08/29/2022  4:12 PM EDT ----- Continue taper ----- Message ----- From: Arville Care, RN Sent: 08/29/2022   4:09 PM EDT To: Benjiman Core, MD  So just to clarify, is she to continue with the prednisone taper or continue taking the current dose?  ----- Message ----- From: Benjiman Core, MD Sent: 08/29/2022   1:52 PM EDT To: Arville Care, RN  Please let her know her platelets dropped but no changes in her prednisone taper.  I am aware of her menstrual cycle.  We will check labs next week and decide whether changes are needed.

## 2022-08-29 NOTE — Telephone Encounter (Signed)
PC to patient, no answer, left VM - informed her of platelet level, instructed patient to continue prednisone taper as before per Dr. Clelia Croft.  Informed patient of next appointment.  Instructed patient to call this office with any questions/concerns, [phone].

## 2022-09-05 ENCOUNTER — Encounter: Payer: Self-pay | Admitting: Oncology

## 2022-09-05 ENCOUNTER — Inpatient Hospital Stay (HOSPITAL_BASED_OUTPATIENT_CLINIC_OR_DEPARTMENT_OTHER): Payer: BC Managed Care – PPO | Admitting: Oncology

## 2022-09-05 ENCOUNTER — Inpatient Hospital Stay: Payer: BC Managed Care – PPO

## 2022-09-05 VITALS — BP 135/95 | HR 130 | Temp 97.7°F | Resp 16 | Ht 65.0 in | Wt 241.7 lb

## 2022-09-05 DIAGNOSIS — D696 Thrombocytopenia, unspecified: Secondary | ICD-10-CM

## 2022-09-05 DIAGNOSIS — Z793 Long term (current) use of hormonal contraceptives: Secondary | ICD-10-CM | POA: Diagnosis not present

## 2022-09-05 DIAGNOSIS — D693 Immune thrombocytopenic purpura: Secondary | ICD-10-CM | POA: Diagnosis not present

## 2022-09-05 DIAGNOSIS — Z7952 Long term (current) use of systemic steroids: Secondary | ICD-10-CM | POA: Diagnosis not present

## 2022-09-05 DIAGNOSIS — Z79899 Other long term (current) drug therapy: Secondary | ICD-10-CM | POA: Diagnosis not present

## 2022-09-05 DIAGNOSIS — N920 Excessive and frequent menstruation with regular cycle: Secondary | ICD-10-CM | POA: Diagnosis not present

## 2022-09-05 DIAGNOSIS — D649 Anemia, unspecified: Secondary | ICD-10-CM | POA: Diagnosis not present

## 2022-09-05 LAB — CBC WITH DIFFERENTIAL (CANCER CENTER ONLY)
Abs Immature Granulocytes: 0.05 10*3/uL (ref 0.00–0.07)
Basophils Absolute: 0 10*3/uL (ref 0.0–0.1)
Basophils Relative: 0 %
Eosinophils Absolute: 0 10*3/uL (ref 0.0–0.5)
Eosinophils Relative: 0 %
HCT: 36.8 % (ref 36.0–46.0)
Hemoglobin: 12.5 g/dL (ref 12.0–15.0)
Immature Granulocytes: 0 %
Lymphocytes Relative: 10 %
Lymphs Abs: 1.4 10*3/uL (ref 0.7–4.0)
MCH: 28.2 pg (ref 26.0–34.0)
MCHC: 34 g/dL (ref 30.0–36.0)
MCV: 82.9 fL (ref 80.0–100.0)
Monocytes Absolute: 0.4 10*3/uL (ref 0.1–1.0)
Monocytes Relative: 3 %
Neutro Abs: 11.8 10*3/uL — ABNORMAL HIGH (ref 1.7–7.7)
Neutrophils Relative %: 87 %
Platelet Count: 29 10*3/uL — ABNORMAL LOW (ref 150–400)
RBC: 4.44 MIL/uL (ref 3.87–5.11)
RDW: 14.8 % (ref 11.5–15.5)
WBC Count: 13.7 10*3/uL — ABNORMAL HIGH (ref 4.0–10.5)
nRBC: 0 % (ref 0.0–0.2)

## 2022-09-05 MED ORDER — PREDNISONE 20 MG PO TABS: ORAL_TABLET | ORAL | 3 refills | Status: DC

## 2022-09-05 NOTE — Progress Notes (Signed)
Hematology and Oncology Follow Up Visit  Daisy Meyers [id number] Mar 24, 1996 26 y.o. 09/05/2022 1:11 PM Maximiano Coss, NPMorrow, Richard, NP   Principle Diagnosis: 26 year old woman with acute ITP.  With platelet count of 19 and heavy menstrual bleeding in August 2023.     Prior Therapy: She received packed red cell transfusion as well as IV Solu-Medrol during hospitalization on July 25, 2022.  Current therapy: Prednisone 60 mg daily started in August 2023.  She is currently on 10 mg daily.  Interim History: [name] presents today for a follow-up visit.  Since the last visit, she continues to taper down prednisone and currently on 10 mg.  She denies any major complications related to it.  She denies any nausea, fatigue or bleeding complications.  She denies any hematochezia or melena.  She does not report any heavy menstrual bleeding at this time.     Medications: Updated on review. Current Outpatient Medications  Medication Sig Dispense Refill   Ferrous Sulfate (IRON PO) Take 1 tablet by mouth daily.     norethindrone (AYGESTIN) 5 MG tablet Take 5 mg by mouth daily.     predniSONE (DELTASONE) 10 MG tablet Take 1 tablet (10 mg total) by mouth daily. 50 tablet 0   No current facility-administered medications for this visit.     Allergies:  Allergies  Allergen Reactions   No Known Allergies      Physical Exam: Blood pressure (!) 135/95, pulse (!) 130, temperature 97.7 F (36.5 C), temperature source Temporal, resp. rate 16, height 5\' 5"  (1.651 m), weight 241 lb 11.2 oz (109.6 kg), SpO2 98 %.  ECOG: 0     General appearance: Alert, awake without any distress. Head: Atraumatic without abnormalities Oropharynx: Without any thrush or ulcers. Eyes: No scleral icterus. Lymph nodes: No lymphadenopathy noted in the cervical, supraclavicular, or axillary nodes Heart:regular rate and rhythm, without any murmurs or gallops.   Lung: Clear to auscultation without any  rhonchi, wheezes or dullness to percussion. Abdomin: Soft, nontender without any shifting dullness or ascites. Musculoskeletal: No clubbing or cyanosis. Neurological: No motor or sensory deficits. Skin: No rashes or lesions.     Lab Results: Lab Results  Component Value Date   WBC 13.7 (H) 09/05/2022   HGB 12.5 09/05/2022   HCT 36.8 09/05/2022   MCV 82.9 09/05/2022   PLT 29 (L) 09/05/2022     Chemistry      Component Value Date/Time   NA 141 07/27/2022 0445   NA 135 05/25/2020 1643   K 3.6 07/27/2022 0445   CL 115 (H) 07/27/2022 0445   CO2 21 (L) 07/27/2022 0445   BUN 14 07/27/2022 0445   BUN 8 05/25/2020 1643   CREATININE 0.81 07/27/2022 0445      Component Value Date/Time   CALCIUM 8.0 (L) 07/27/2022 0445   ALKPHOS 62 07/25/2022 1730   AST 16 07/25/2022 1730   ALT 15 07/25/2022 1730   BILITOT 0.5 07/25/2022 1730   BILITOT 0.4 05/25/2020 1643         Impression and Plan:  26 year old with:  1.  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  She presented with heavy menstrual bleeding and mild count of 19.    She is currently on a prednisone taper after her excellent response to steroids.  Her platelet count has dropped down to 29 with the steroid taper.  Treatment options going forward were discussed at this time.  I have recommended restarting prednisone at a dose of 60 mg daily.  Additional therapy at this time including rituximab versus a splenectomy were discussed.    Complication associated with rituximab specifically were reviewed.  The schedule will be given at 375 mg weekly for 4 weeks in addition to prednisone.  Complications that include nausea, fatigue and infusion related complications were reiterated.  These complications that include hives, skin rash and rarely anaphylaxis.  After discussion today, she is agreeable to proceed with rituximab in addition to 60 mg of prednisone.   2.  Anemia: Resolved at this time.  This is related to menorrhagia.  3.   Follow-up: She will follow weekly for laboratory testing as well as rituximab treatment for total of 4 cycles.  30  minutes were spent on this encounter.  Time was dedicated to reviewing laboratory data, disease status update and outlining future plan of care discussion.    [name], MD 9/22/20231:11 PM

## 2022-09-05 NOTE — Progress Notes (Signed)
START OFF PATHWAY REGIMEN - Other   [id number]:Rituximab IV/SUBQ D1 q7 Days:   Cycle 1: A cycle is 7 days:     Rituximab-xxxx    Cycles 2 and beyond: A cycle is every 7 days:     Rituximab and hyaluronidase human   **Always confirm dose/schedule in your pharmacy ordering system**  Patient Characteristics: Intent of Therapy: Curative Intent, Discussed with Patient 

## 2022-09-08 ENCOUNTER — Encounter: Payer: Self-pay | Admitting: Oncology

## 2022-09-08 ENCOUNTER — Telehealth: Payer: Self-pay | Admitting: Oncology

## 2022-09-08 NOTE — Telephone Encounter (Signed)
Scheduled per 09/22 los, patient has been called and voicemail was left. 

## 2022-09-12 ENCOUNTER — Telehealth: Payer: Self-pay | Admitting: *Deleted

## 2022-09-12 ENCOUNTER — Inpatient Hospital Stay: Payer: BC Managed Care – PPO

## 2022-09-12 DIAGNOSIS — D649 Anemia, unspecified: Secondary | ICD-10-CM | POA: Diagnosis not present

## 2022-09-12 DIAGNOSIS — Z7952 Long term (current) use of systemic steroids: Secondary | ICD-10-CM | POA: Diagnosis not present

## 2022-09-12 DIAGNOSIS — D693 Immune thrombocytopenic purpura: Secondary | ICD-10-CM | POA: Diagnosis not present

## 2022-09-12 DIAGNOSIS — Z793 Long term (current) use of hormonal contraceptives: Secondary | ICD-10-CM | POA: Diagnosis not present

## 2022-09-12 DIAGNOSIS — D696 Thrombocytopenia, unspecified: Secondary | ICD-10-CM | POA: Diagnosis not present

## 2022-09-12 DIAGNOSIS — Z79899 Other long term (current) drug therapy: Secondary | ICD-10-CM | POA: Diagnosis not present

## 2022-09-12 DIAGNOSIS — N920 Excessive and frequent menstruation with regular cycle: Secondary | ICD-10-CM | POA: Diagnosis not present

## 2022-09-12 LAB — CBC WITH DIFFERENTIAL (CANCER CENTER ONLY)
Abs Immature Granulocytes: 0.11 10*3/uL — ABNORMAL HIGH (ref 0.00–0.07)
Basophils Absolute: 0 10*3/uL (ref 0.0–0.1)
Basophils Relative: 0 %
Eosinophils Absolute: 0 10*3/uL (ref 0.0–0.5)
Eosinophils Relative: 0 %
HCT: 38.9 % (ref 36.0–46.0)
Hemoglobin: 13.5 g/dL (ref 12.0–15.0)
Immature Granulocytes: 1 %
Lymphocytes Relative: 11 %
Lymphs Abs: 1.8 10*3/uL (ref 0.7–4.0)
MCH: 28.2 pg (ref 26.0–34.0)
MCHC: 34.7 g/dL (ref 30.0–36.0)
MCV: 81.4 fL (ref 80.0–100.0)
Monocytes Absolute: 0.2 10*3/uL (ref 0.1–1.0)
Monocytes Relative: 1 %
Neutro Abs: 14.5 10*3/uL — ABNORMAL HIGH (ref 1.7–7.7)
Neutrophils Relative %: 87 %
Platelet Count: 162 10*3/uL (ref 150–400)
RBC: 4.78 MIL/uL (ref 3.87–5.11)
RDW: 14.5 % (ref 11.5–15.5)
WBC Count: 16.6 10*3/uL — ABNORMAL HIGH (ref 4.0–10.5)
nRBC: 0 % (ref 0.0–0.2)

## 2022-09-12 NOTE — Telephone Encounter (Signed)
-----   Message from Daisy Portela, MD sent at 09/12/2022  2:20 PM EDT ----- Please let her know her platelets are normal. She needs to keep her prednisone the same at this time

## 2022-09-12 NOTE — Telephone Encounter (Signed)
Notified of message below

## 2022-09-18 ENCOUNTER — Encounter: Payer: Self-pay | Admitting: Oncology

## 2022-09-18 NOTE — Progress Notes (Signed)
Called pt to introduce myself as her Financial Resource Specialist and to discuss the [employer] grant.  I left a msg requesting she return my call if she's interested in applying for the grant.  ?

## 2022-09-19 ENCOUNTER — Inpatient Hospital Stay (HOSPITAL_BASED_OUTPATIENT_CLINIC_OR_DEPARTMENT_OTHER): Payer: BC Managed Care – PPO | Admitting: Physician Assistant

## 2022-09-19 ENCOUNTER — Inpatient Hospital Stay: Payer: BC Managed Care – PPO | Attending: Oncology

## 2022-09-19 ENCOUNTER — Inpatient Hospital Stay: Payer: BC Managed Care – PPO

## 2022-09-19 ENCOUNTER — Other Ambulatory Visit: Payer: Self-pay

## 2022-09-19 ENCOUNTER — Telehealth: Payer: Self-pay

## 2022-09-19 VITALS — BP 134/76 | HR 102 | Temp 98.5°F | Resp 18 | Wt 242.2 lb

## 2022-09-19 DIAGNOSIS — R0989 Other specified symptoms and signs involving the circulatory and respiratory systems: Secondary | ICD-10-CM

## 2022-09-19 DIAGNOSIS — D696 Thrombocytopenia, unspecified: Secondary | ICD-10-CM

## 2022-09-19 DIAGNOSIS — D500 Iron deficiency anemia secondary to blood loss (chronic): Secondary | ICD-10-CM | POA: Diagnosis not present

## 2022-09-19 DIAGNOSIS — T50905A Adverse effect of unspecified drugs, medicaments and biological substances, initial encounter: Secondary | ICD-10-CM | POA: Diagnosis not present

## 2022-09-19 DIAGNOSIS — L299 Pruritus, unspecified: Secondary | ICD-10-CM

## 2022-09-19 DIAGNOSIS — Z5112 Encounter for antineoplastic immunotherapy: Secondary | ICD-10-CM | POA: Diagnosis not present

## 2022-09-19 DIAGNOSIS — Z79899 Other long term (current) drug therapy: Secondary | ICD-10-CM | POA: Diagnosis not present

## 2022-09-19 DIAGNOSIS — T451X5A Adverse effect of antineoplastic and immunosuppressive drugs, initial encounter: Secondary | ICD-10-CM

## 2022-09-19 DIAGNOSIS — Z7952 Long term (current) use of systemic steroids: Secondary | ICD-10-CM | POA: Diagnosis not present

## 2022-09-19 DIAGNOSIS — N920 Excessive and frequent menstruation with regular cycle: Secondary | ICD-10-CM | POA: Diagnosis not present

## 2022-09-19 DIAGNOSIS — D693 Immune thrombocytopenic purpura: Secondary | ICD-10-CM | POA: Insufficient documentation

## 2022-09-19 DIAGNOSIS — D649 Anemia, unspecified: Secondary | ICD-10-CM

## 2022-09-19 DIAGNOSIS — Z23 Encounter for immunization: Secondary | ICD-10-CM | POA: Insufficient documentation

## 2022-09-19 LAB — CBC WITH DIFFERENTIAL (CANCER CENTER ONLY)
Abs Immature Granulocytes: 0.04 10*3/uL (ref 0.00–0.07)
Basophils Absolute: 0 10*3/uL (ref 0.0–0.1)
Basophils Relative: 0 %
Eosinophils Absolute: 0 10*3/uL (ref 0.0–0.5)
Eosinophils Relative: 0 %
HCT: 39.4 % (ref 36.0–46.0)
Hemoglobin: 13.2 g/dL (ref 12.0–15.0)
Immature Granulocytes: 0 %
Lymphocytes Relative: 43 %
Lymphs Abs: 5.5 10*3/uL — ABNORMAL HIGH (ref 0.7–4.0)
MCH: 27.8 pg (ref 26.0–34.0)
MCHC: 33.5 g/dL (ref 30.0–36.0)
MCV: 83.1 fL (ref 80.0–100.0)
Monocytes Absolute: 0.6 10*3/uL (ref 0.1–1.0)
Monocytes Relative: 5 %
Neutro Abs: 6.5 10*3/uL (ref 1.7–7.7)
Neutrophils Relative %: 52 %
Platelet Count: 157 10*3/uL (ref 150–400)
RBC: 4.74 MIL/uL (ref 3.87–5.11)
RDW: 14.6 % (ref 11.5–15.5)
WBC Count: 12.6 10*3/uL — ABNORMAL HIGH (ref 4.0–10.5)
nRBC: 0 % (ref 0.0–0.2)

## 2022-09-19 LAB — CMP (CANCER CENTER ONLY)
ALT: 12 U/L (ref 0–44)
AST: 10 U/L — ABNORMAL LOW (ref 15–41)
Albumin: 3.9 g/dL (ref 3.5–5.0)
Alkaline Phosphatase: 68 U/L (ref 38–126)
Anion gap: 10 (ref 5–15)
BUN: 16 mg/dL (ref 6–20)
CO2: 24 mmol/L (ref 22–32)
Calcium: 8.8 mg/dL — ABNORMAL LOW (ref 8.9–10.3)
Chloride: 104 mmol/L (ref 98–111)
Creatinine: 1.18 mg/dL — ABNORMAL HIGH (ref 0.44–1.00)
GFR, Estimated: >60 mL/min (ref >60)
Glucose, Bld: 138 mg/dL — ABNORMAL HIGH (ref 70–99)
Potassium: 3.8 mmol/L (ref 3.5–5.1)
Sodium: 138 mmol/L (ref 135–145)
Total Bilirubin: 0.4 mg/dL (ref 0.3–1.2)
Total Protein: 6.9 g/dL (ref 6.5–8.1)

## 2022-09-19 MED ORDER — SODIUM CHLORIDE 0.9 % IV SOLN
375.0000 mg/m2 | Freq: Once | INTRAVENOUS | Status: AC
  Administered 2022-09-19: 800 mg via INTRAVENOUS
  Filled 2022-09-19: qty 50

## 2022-09-19 MED ORDER — ACETAMINOPHEN 325 MG PO TABS
650.0000 mg | ORAL_TABLET | Freq: Once | ORAL | Status: AC
  Administered 2022-09-19: 650 mg via ORAL
  Filled 2022-09-19: qty 2

## 2022-09-19 MED ORDER — METHYLPREDNISOLONE SODIUM SUCC 125 MG IJ SOLR
125.0000 mg | Freq: Once | INTRAMUSCULAR | Status: AC | PRN
  Administered 2022-09-19: 125 mg via INTRAVENOUS

## 2022-09-19 MED ORDER — DIPHENHYDRAMINE HCL 25 MG PO CAPS
50.0000 mg | ORAL_CAPSULE | Freq: Once | ORAL | Status: AC
  Administered 2022-09-19: 50 mg via ORAL
  Filled 2022-09-19: qty 2

## 2022-09-19 MED ORDER — FAMOTIDINE IN NACL 20-0.9 MG/50ML-% IV SOLN
20.0000 mg | Freq: Once | INTRAVENOUS | Status: AC | PRN
  Administered 2022-09-19: 20 mg via INTRAVENOUS

## 2022-09-19 MED ORDER — SODIUM CHLORIDE 0.9 % IV SOLN: Freq: Once | INTRAVENOUS | Status: DC | PRN

## 2022-09-19 MED ORDER — SODIUM CHLORIDE 0.9 % IV SOLN: Freq: Once | INTRAVENOUS | Status: AC

## 2022-09-19 NOTE — Progress Notes (Signed)
Hypersensitivity Reaction note  Date of event: 09/19/22 Time of event: 1303   Generic name of drug involved: Rituxan Name of provider notified of the hypersensitivity reaction: Anda Kraft, [place], Dr. Alen Blew Was agent that likely caused hypersensitivity reaction added to Allergies List within EMR? Yes Chain of events including reaction signs/symptoms, treatment administered, and outcome (e.g., drug resumed; drug discontinued; sent to Emergency Department; etc.)   Patient reported her scalp felt itchy right before titration increased for second time. Infusion paused, patient stated symptoms were spreading. Her ears felt hot and throat was getting scratchy. [facility] was called and emergency medication was administered. SEE MAR and FLOWSHEET.   Symptoms resolved at 1350 and Rituxan was rechallenged per Dr. Alen Blew.  Belva Chimes, RN 09/19/2022 1:48 PM

## 2022-09-19 NOTE — Telephone Encounter (Signed)
Pt advised by Jethro Bolus, RN while pt was in infusion

## 2022-09-19 NOTE — Telephone Encounter (Signed)
-----   Message from Daisy Portela, MD sent at 09/19/2022 11:28 AM EDT ----- Please let her know to decrease her prednisone to 50 mg daily

## 2022-09-19 NOTE — Patient Instructions (Signed)
[facility] ONCOLOGY  Discharge Instructions: Thank you for choosing [facility] to provide your oncology and hematology care.   If you have a lab appointment with the [facility], please go directly to the [facility] and check in at the registration area.   Wear comfortable clothing and clothing appropriate for easy access to any Portacath or PICC line.   We strive to give you quality time with your provider. You may need to reschedule your appointment if you arrive late (15 or more minutes).  Arriving late affects you and other patients whose appointments are after yours.  Also, if you miss three or more appointments without notifying the office, you may be dismissed from the clinic at the provider's discretion.      For prescription refill requests, have your pharmacy contact our office and allow 72 hours for refills to be completed.    Today you received the following chemotherapy and/or immunotherapy agents Rituxan      To help prevent nausea and vomiting after your treatment, we encourage you to take your nausea medication as directed.  BELOW ARE SYMPTOMS THAT SHOULD BE REPORTED IMMEDIATELY: *FEVER GREATER THAN 100.4 F (38 C) OR HIGHER *CHILLS OR SWEATING *NAUSEA AND VOMITING THAT IS NOT CONTROLLED WITH YOUR NAUSEA MEDICATION *UNUSUAL SHORTNESS OF BREATH *UNUSUAL BRUISING OR BLEEDING *URINARY PROBLEMS (pain or burning when urinating, or frequent urination) *BOWEL PROBLEMS (unusual diarrhea, constipation, pain near the anus) TENDERNESS IN MOUTH AND THROAT WITH OR WITHOUT PRESENCE OF ULCERS (sore throat, sores in mouth, or a toothache) UNUSUAL RASH, SWELLING OR PAIN  UNUSUAL VAGINAL DISCHARGE OR ITCHING   Items with * indicate a potential emergency and should be followed up as soon as possible or go to the Emergency Department if any problems should occur.  Please show the CHEMOTHERAPY ALERT CARD or IMMUNOTHERAPY ALERT CARD at check-in to the  Emergency Department and triage nurse.  Should you have questions after your visit or need to cancel or reschedule your appointment, please contact [facility]  Dept: [phone]  and follow the prompts.  Office hours are 8:00 a.m. to 4:30 p.m. Monday - Friday. Please note that voicemails left after 4:00 p.m. may not be returned until the following business day.  We are closed weekends and major holidays. You have access to a nurse at all times for urgent questions. Please call the main number to the clinic Dept: [phone] and follow the prompts.   For any non-urgent questions, you may also contact your provider using MyChart. We now offer e-Visits for anyone 62 and older to request care online for non-urgent symptoms. For details visit [email or web address].   Also download the MyChart app! Go to the app store, search "MyChart", open the app, select [facility], and log in with your MyChart username and password.  Masks are optional in the cancer centers. If you would like for your care team to wear a mask while they are taking care of you, please let them know. You may have one support person who is at least 28 years old accompany you for your appointments. Rituximab Injection What is this medication? RITUXIMAB (ri TUX i mab) treats leukemia and lymphoma. It works by blocking a protein that causes cancer cells to grow and multiply. This helps to slow or stop the spread of cancer cells. It may also be used to treat autoimmune conditions, such as arthritis. It works by slowing down an overactive immune system. It  is a monoclonal antibody. This medicine may be used for other purposes; ask your health care provider or pharmacist if you have questions. COMMON BRAND NAME(S): RIABNI, Rituxan, RUXIENCE, truxima What should I tell my care team before I take this medication? They need to know if you have any of these conditions: Chest pain Heart disease Immune system  problems Infection, such as chickenpox, cold sores, hepatitis B, herpes Irregular heartbeat or rhythm Kidney disease Low blood counts, such as low white cells, platelets, red cells Lung disease Recent or upcoming vaccine An unusual or allergic reaction to rituximab, other medications, foods, dyes, or preservatives Pregnant or trying to get pregnant Breast-feeding How should I use this medication? This medication is injected into a vein. It is given by a care team in a hospital or clinic setting. A special MedGuide will be given to you before each treatment. Be sure to read this information carefully each time. Talk to your care team about the use of this medication in children. While this medication may be prescribed for children as young as 6 months for selected conditions, precautions do apply. Overdosage: If you think you have taken too much of this medicine contact a poison control center or emergency room at once. NOTE: This medicine is only for you. Do not share this medicine with others. What if I miss a dose? Keep appointments for follow-up doses. It is important not to miss your dose. Call your care team if you are unable to keep an appointment. What may interact with this medication? Do not take this medication with any of the following: Live vaccines This medication may also interact with the following: Cisplatin This list may not describe all possible interactions. Give your health care provider a list of all the medicines, herbs, non-prescription drugs, or dietary supplements you use. Also tell them if you smoke, drink alcohol, or use illegal drugs. Some items may interact with your medicine. What should I watch for while using this medication? Your condition will be monitored carefully while you are receiving this medication. You may need blood work while taking this medication. This medication can cause serious infusion reactions. To reduce the risk your care team may give  you other medications to take before receiving this one. Be sure to follow the directions from your care team. This medication may increase your risk of getting an infection. Call your care team for advice if you get a fever, chills, sore throat, or other symptoms of a cold or flu. Do not treat yourself. Try to avoid being around people who are sick. Call your care team if you are around anyone with measles, chickenpox, or if you develop sores or blisters that do not heal properly. Avoid taking medications that contain aspirin, acetaminophen, ibuprofen, naproxen, or ketoprofen unless instructed by your care team. These medications may hide a fever. This medication may cause serious skin reactions. They can happen weeks to months after starting the medication. Contact your care team right away if you notice fevers or flu-like symptoms with a rash. The rash may be red or purple and then turn into blisters or peeling of the skin. You may also notice a red rash with swelling of the face, lips, or lymph nodes in your neck or under your arms. In some patients, this medication may cause a serious brain infection that may cause death. If you have any problems seeing, thinking, speaking, walking, or standing, tell your care team right away. If you cannot reach your care  team, urgently seek another source of medical care. Talk to your care team if you may be pregnant. Serious birth defects can occur if you take this medication during pregnancy and for 12 months after the last dose. You will need a negative pregnancy test before starting this medication. Contraception is recommended while taking this medication and for 12 months after the last dose. Your care team can help you find the option that works for you. Do not breastfeed while taking this medication and for at least 6 months after the last dose. What side effects may I notice from receiving this medication? Side effects that you should report to your care  team as soon as possible: Allergic reactions or angioedema--skin rash, itching or hives, swelling of the face, eyes, lips, tongue, arms, or legs, trouble swallowing or breathing Bowel blockage--stomach cramping, unable to have a bowel movement or pass gas, loss of appetite, vomiting Dizziness, loss of balance or coordination, confusion or trouble speaking Heart attack--pain or tightness in the chest, shoulders, arms, or jaw, nausea, shortness of breath, cold or clammy skin, feeling faint or lightheaded Heart rhythm changes--fast or irregular heartbeat, dizziness, feeling faint or lightheaded, chest pain, trouble breathing Infection--fever, chills, cough, sore throat, wounds that don't heal, pain or trouble when passing urine, general feeling of discomfort or being unwell Infusion reactions--chest pain, shortness of breath or trouble breathing, feeling faint or lightheaded Kidney injury--decrease in the amount of urine, swelling of the ankles, hands, or feet Liver injury--right upper belly pain, loss of appetite, nausea, light-colored stool, dark yellow or brown urine, yellowing skin or eyes, unusual weakness or fatigue Redness, blistering, peeling, or loosening of the skin, including inside the mouth Stomach pain that is severe, does not go away, or gets worse Tumor lysis syndrome (TLS)--nausea, vomiting, diarrhea, decrease in the amount of urine, dark urine, unusual weakness or fatigue, confusion, muscle pain or cramps, fast or irregular heartbeat, joint pain Side effects that usually do not require medical attention (report to your care team if they continue or are bothersome): Headache Joint pain Nausea Runny or stuffy nose Unusual weakness or fatigue This list may not describe all possible side effects. Call your doctor for medical advice about side effects. You may report side effects to FDA at 1-800-FDA-1088. Where should I keep my medication? This medication is given in a hospital or  clinic. It will not be stored at home. NOTE: This sheet is a summary. It may not cover all possible information. If you have questions about this medicine, talk to your doctor, pharmacist, or health care provider.  2023 Elsevier/Gold Standard (2022-04-21 00:00:00)

## 2022-09-19 NOTE — Progress Notes (Signed)
    DATE:  09/19/22                                        X CHEMO/IMMUNOTHERAPY REACTION            MD: Alen Blew   AGENT/BLOOD PRODUCT RECEIVING TODAY:              rituximab-pvvr     VS: BP:     124/90   P:       101       SPO2:       100% RA                BP:     125/89   P:       109       SPO2:       100% RA     REACTION(S):           pruritus, scratchy throat   PREMEDS:     Benadryl 50 mg PO, Tylenol 650 mg PO   INTERVENTION: Pepcid 20 mg IV, Solumedrol 125 mg IV, IVF   Review of Systems  Review of Systems  HENT:  Positive for trouble swallowing (scratchy throat sensation).   Skin:  Positive for color change (facial flushing).     Physical Exam  Physical Exam Vitals and nursing note reviewed.  Constitutional:      Appearance: She is well-developed. She is not ill-appearing or toxic-appearing.     Comments: Airway patent  HENT:     Head: Normocephalic.     Nose: Nose normal.     Mouth/Throat:     Mouth: Mucous membranes are moist.     Pharynx: Oropharynx is clear.     Comments: Voice is clear.  Eyes:     Conjunctiva/sclera: Conjunctivae normal.  Neck:     Vascular: No JVD.  Cardiovascular:     Rate and Rhythm: Normal rate and regular rhythm.     Pulses: Normal pulses.     Heart sounds: Normal heart sounds.  Pulmonary:     Effort: Pulmonary effort is normal. No respiratory distress.     Breath sounds: Normal breath sounds. No stridor. No wheezing, rhonchi or rales.  Chest:     Chest wall: No tenderness.  Abdominal:     General: There is no distension.  Musculoskeletal:     Cervical back: Normal range of motion.  Skin:    General: Skin is warm and dry.     Findings: Erythema (facial flushing) present.  Neurological:     Mental Status: She is oriented to person, place, and time.     OUTCOME:                Patient complaining of throat scratching sensation and scalp pruritus. Airway remained patent. Given Emergency medications as above and symptoms  resolved. Dr. Alen Blew informed of reaction and recommends proceeding when symptoms resolve. Symptoms resolved and patient tolerated remainder of treatment without adverse effects.    I have spent a total of 20 minutes minutes of face-to-face and non-face-to-face time, preparing to see the patient, obtaining and/or reviewing separately obtained history, performing a medically appropriate examination, counseling and educating the patient, documenting clinical information in the electronic health record, and care coordination (communications with other health care professionals or caregivers).

## 2022-09-24 ENCOUNTER — Telehealth: Payer: Self-pay | Admitting: Oncology

## 2022-09-24 NOTE — Telephone Encounter (Signed)
Rescheduled per 10/27 per provider pal, called patient regarding upcoming rescheduled appointments. Left a voicemail.  

## 2022-09-26 ENCOUNTER — Inpatient Hospital Stay: Payer: BC Managed Care – PPO

## 2022-09-26 ENCOUNTER — Telehealth: Payer: Self-pay | Admitting: *Deleted

## 2022-09-26 VITALS — BP 117/80 | HR 82 | Temp 99.0°F | Resp 18 | Wt 243.2 lb

## 2022-09-26 DIAGNOSIS — Z7952 Long term (current) use of systemic steroids: Secondary | ICD-10-CM | POA: Diagnosis not present

## 2022-09-26 DIAGNOSIS — D696 Thrombocytopenia, unspecified: Secondary | ICD-10-CM

## 2022-09-26 DIAGNOSIS — D693 Immune thrombocytopenic purpura: Secondary | ICD-10-CM | POA: Diagnosis not present

## 2022-09-26 DIAGNOSIS — Z23 Encounter for immunization: Secondary | ICD-10-CM | POA: Diagnosis not present

## 2022-09-26 DIAGNOSIS — Z5112 Encounter for antineoplastic immunotherapy: Secondary | ICD-10-CM | POA: Diagnosis not present

## 2022-09-26 DIAGNOSIS — Z79899 Other long term (current) drug therapy: Secondary | ICD-10-CM | POA: Diagnosis not present

## 2022-09-26 DIAGNOSIS — D500 Iron deficiency anemia secondary to blood loss (chronic): Secondary | ICD-10-CM | POA: Diagnosis not present

## 2022-09-26 DIAGNOSIS — N920 Excessive and frequent menstruation with regular cycle: Secondary | ICD-10-CM | POA: Diagnosis not present

## 2022-09-26 DIAGNOSIS — D649 Anemia, unspecified: Secondary | ICD-10-CM

## 2022-09-26 LAB — CBC WITH DIFFERENTIAL (CANCER CENTER ONLY)
Abs Immature Granulocytes: 0.05 10*3/uL (ref 0.00–0.07)
Basophils Absolute: 0 10*3/uL (ref 0.0–0.1)
Basophils Relative: 0 %
Eosinophils Absolute: 0 10*3/uL (ref 0.0–0.5)
Eosinophils Relative: 0 %
HCT: 39.4 % (ref 36.0–46.0)
Hemoglobin: 13.4 g/dL (ref 12.0–15.0)
Immature Granulocytes: 0 %
Lymphocytes Relative: 6 %
Lymphs Abs: 0.9 10*3/uL (ref 0.7–4.0)
MCH: 27.9 pg (ref 26.0–34.0)
MCHC: 34 g/dL (ref 30.0–36.0)
MCV: 82.1 fL (ref 80.0–100.0)
Monocytes Absolute: 0.1 10*3/uL (ref 0.1–1.0)
Monocytes Relative: 1 %
Neutro Abs: 14.3 10*3/uL — ABNORMAL HIGH (ref 1.7–7.7)
Neutrophils Relative %: 93 %
Platelet Count: 220 10*3/uL (ref 150–400)
RBC: 4.8 MIL/uL (ref 3.87–5.11)
RDW: 14.6 % (ref 11.5–15.5)
WBC Count: 15.3 10*3/uL — ABNORMAL HIGH (ref 4.0–10.5)
nRBC: 0 % (ref 0.0–0.2)

## 2022-09-26 MED ORDER — ACETAMINOPHEN 325 MG PO TABS
650.0000 mg | ORAL_TABLET | Freq: Once | ORAL | Status: AC
  Administered 2022-09-26: 650 mg via ORAL
  Filled 2022-09-26: qty 2

## 2022-09-26 MED ORDER — METHYLPREDNISOLONE SODIUM SUCC 125 MG IJ SOLR
125.0000 mg | Freq: Once | INTRAMUSCULAR | Status: AC
  Administered 2022-09-26: 125 mg via INTRAVENOUS
  Filled 2022-09-26: qty 2

## 2022-09-26 MED ORDER — FAMOTIDINE IN NACL 20-0.9 MG/50ML-% IV SOLN
20.0000 mg | Freq: Once | INTRAVENOUS | Status: AC
  Administered 2022-09-26: 20 mg via INTRAVENOUS
  Filled 2022-09-26: qty 50

## 2022-09-26 MED ORDER — SODIUM CHLORIDE 0.9 % IV SOLN
375.0000 mg/m2 | Freq: Once | INTRAVENOUS | Status: AC
  Administered 2022-09-26: 800 mg via INTRAVENOUS
  Filled 2022-09-26: qty 50

## 2022-09-26 MED ORDER — SODIUM CHLORIDE 0.9 % IV SOLN: Freq: Once | INTRAVENOUS | Status: AC

## 2022-09-26 MED ORDER — METHYLPREDNISOLONE NA SUC (PF) 125 MG IJ SOLR
125.0000 mg | Freq: Once | INTRAMUSCULAR | Status: DC
  Filled 2022-09-26: qty 2

## 2022-09-26 MED ORDER — DIPHENHYDRAMINE HCL 25 MG PO CAPS
50.0000 mg | ORAL_CAPSULE | Freq: Once | ORAL | Status: AC
  Administered 2022-09-26: 50 mg via ORAL
  Filled 2022-09-26: qty 2

## 2022-09-26 NOTE — Progress Notes (Signed)
Per Dr Alen Blew, ok to treat today as a subsequent infusion

## 2022-09-26 NOTE — Patient Instructions (Signed)
[facility]  Discharge Instructions: Thank you for choosing [facility] to provide your oncology and hematology care.   If you have a lab appointment with the Cancer Center, please go directly to the Cancer Center and check in at the registration area.   Wear comfortable clothing and clothing appropriate for easy access to any Portacath or PICC line.   We strive to give you quality time with your provider. You may need to reschedule your appointment if you arrive late (15 or more minutes).  Arriving late affects you and other patients whose appointments are after yours.  Also, if you miss three or more appointments without notifying the office, you may be dismissed from the clinic at the provider's discretion.      For prescription refill requests, have your pharmacy contact our office and allow 72 hours for refills to be completed.    Today you received the following chemotherapy and/or immunotherapy agents rituximab      To help prevent nausea and vomiting after your treatment, we encourage you to take your nausea medication as directed.  BELOW ARE SYMPTOMS THAT SHOULD BE REPORTED IMMEDIATELY: *FEVER GREATER THAN 100.4 F (38 C) OR HIGHER *CHILLS OR SWEATING *NAUSEA AND VOMITING THAT IS NOT CONTROLLED WITH YOUR NAUSEA MEDICATION *UNUSUAL SHORTNESS OF BREATH *UNUSUAL BRUISING OR BLEEDING *URINARY PROBLEMS (pain or burning when urinating, or frequent urination) *BOWEL PROBLEMS (unusual diarrhea, constipation, pain near the anus) TENDERNESS IN MOUTH AND THROAT WITH OR WITHOUT PRESENCE OF ULCERS (sore throat, sores in mouth, or a toothache) UNUSUAL RASH, SWELLING OR PAIN  UNUSUAL VAGINAL DISCHARGE OR ITCHING   Items with * indicate a potential emergency and should be followed up as soon as possible or go to the Emergency Department if any problems should occur.  Please show the CHEMOTHERAPY ALERT CARD or IMMUNOTHERAPY ALERT CARD at check-in to  the Emergency Department and triage nurse.  Should you have questions after your visit or need to cancel or reschedule your appointment, please contact [facility]  Dept: [phone]  and follow the prompts.  Office hours are 8:00 a.m. to 4:30 p.m. Monday - Friday. Please note that voicemails left after 4:00 p.m. may not be returned until the following business day.  We are closed weekends and major holidays. You have access to a nurse at all times for urgent questions. Please call the main number to the clinic Dept: [phone] and follow the prompts.   For any non-urgent questions, you may also contact your provider using MyChart. We now offer e-Visits for anyone 18 and older to request care online for non-urgent symptoms. For details visit [email or web address].   Also download the MyChart app! Go to the app store, search "MyChart", open the app, select [facility], and log in with your MyChart username and password.  Masks are optional in the cancer centers. If you would like for your care team to wear a mask while they are taking care of you, please let them know. You may have one support person who is at least 28 years old accompany you for your appointments. 

## 2022-09-26 NOTE — Telephone Encounter (Signed)
LM with note below 

## 2022-09-26 NOTE — Telephone Encounter (Signed)
-----   Message from Wyatt Portela, MD sent at 09/26/2022  1:05 PM EDT ----- Please let her know to decrease her prednisone by 10 mg (continue current taper).  Her platelets continue to be normal.

## 2022-09-27 ENCOUNTER — Encounter: Payer: Self-pay | Admitting: Oncology

## 2022-10-02 ENCOUNTER — Other Ambulatory Visit: Payer: Self-pay | Admitting: *Deleted

## 2022-10-02 DIAGNOSIS — Z23 Encounter for immunization: Secondary | ICD-10-CM

## 2022-10-02 MED ORDER — INFLUENZA VAC SPLIT QUAD 0.5 ML IM SUSY: 0.5000 mL | PREFILLED_SYRINGE | INTRAMUSCULAR | Status: DC

## 2022-10-03 ENCOUNTER — Inpatient Hospital Stay (HOSPITAL_BASED_OUTPATIENT_CLINIC_OR_DEPARTMENT_OTHER): Payer: BC Managed Care – PPO | Admitting: Oncology

## 2022-10-03 ENCOUNTER — Ambulatory Visit: Payer: BC Managed Care – PPO | Admitting: Oncology

## 2022-10-03 ENCOUNTER — Ambulatory Visit: Payer: BC Managed Care – PPO

## 2022-10-03 ENCOUNTER — Inpatient Hospital Stay: Payer: BC Managed Care – PPO

## 2022-10-03 VITALS — BP 118/67 | HR 93 | Temp 97.9°F | Resp 18

## 2022-10-03 VITALS — BP 127/83 | HR 97 | Temp 98.0°F | Resp 19 | Ht 65.0 in | Wt 246.0 lb

## 2022-10-03 DIAGNOSIS — D696 Thrombocytopenia, unspecified: Secondary | ICD-10-CM

## 2022-10-03 DIAGNOSIS — Z5112 Encounter for antineoplastic immunotherapy: Secondary | ICD-10-CM | POA: Diagnosis not present

## 2022-10-03 DIAGNOSIS — Z79899 Other long term (current) drug therapy: Secondary | ICD-10-CM | POA: Diagnosis not present

## 2022-10-03 DIAGNOSIS — N920 Excessive and frequent menstruation with regular cycle: Secondary | ICD-10-CM | POA: Diagnosis not present

## 2022-10-03 DIAGNOSIS — D500 Iron deficiency anemia secondary to blood loss (chronic): Secondary | ICD-10-CM | POA: Diagnosis not present

## 2022-10-03 DIAGNOSIS — D693 Immune thrombocytopenic purpura: Secondary | ICD-10-CM | POA: Diagnosis not present

## 2022-10-03 DIAGNOSIS — Z23 Encounter for immunization: Secondary | ICD-10-CM | POA: Diagnosis not present

## 2022-10-03 DIAGNOSIS — D649 Anemia, unspecified: Secondary | ICD-10-CM

## 2022-10-03 DIAGNOSIS — Z7952 Long term (current) use of systemic steroids: Secondary | ICD-10-CM | POA: Diagnosis not present

## 2022-10-03 LAB — CBC WITH DIFFERENTIAL (CANCER CENTER ONLY)
Abs Immature Granulocytes: 0.06 10*3/uL (ref 0.00–0.07)
Basophils Absolute: 0 10*3/uL (ref 0.0–0.1)
Basophils Relative: 0 %
Eosinophils Absolute: 0.1 10*3/uL (ref 0.0–0.5)
Eosinophils Relative: 0 %
HCT: 37 % (ref 36.0–46.0)
Hemoglobin: 12.8 g/dL (ref 12.0–15.0)
Immature Granulocytes: 0 %
Lymphocytes Relative: 8 %
Lymphs Abs: 1.2 10*3/uL (ref 0.7–4.0)
MCH: 28.6 pg (ref 26.0–34.0)
MCHC: 34.6 g/dL (ref 30.0–36.0)
MCV: 82.6 fL (ref 80.0–100.0)
Monocytes Absolute: 0.4 10*3/uL (ref 0.1–1.0)
Monocytes Relative: 3 %
Neutro Abs: 14.5 10*3/uL — ABNORMAL HIGH (ref 1.7–7.7)
Neutrophils Relative %: 89 %
Platelet Count: 220 10*3/uL (ref 150–400)
RBC: 4.48 MIL/uL (ref 3.87–5.11)
RDW: 14.9 % (ref 11.5–15.5)
WBC Count: 16.3 10*3/uL — ABNORMAL HIGH (ref 4.0–10.5)
nRBC: 0 % (ref 0.0–0.2)

## 2022-10-03 MED ORDER — SODIUM CHLORIDE 0.9 % IV SOLN: Freq: Once | INTRAVENOUS | Status: AC

## 2022-10-03 MED ORDER — FAMOTIDINE IN NACL 20-0.9 MG/50ML-% IV SOLN
20.0000 mg | Freq: Once | INTRAVENOUS | Status: AC
  Administered 2022-10-03: 20 mg via INTRAVENOUS
  Filled 2022-10-03: qty 50

## 2022-10-03 MED ORDER — SODIUM CHLORIDE 0.9 % IV SOLN
375.0000 mg/m2 | Freq: Once | INTRAVENOUS | Status: AC
  Administered 2022-10-03: 800 mg via INTRAVENOUS
  Filled 2022-10-03: qty 50

## 2022-10-03 MED ORDER — INFLUENZA VAC SPLIT QUAD 0.5 ML IM SUSY
0.5000 mL | PREFILLED_SYRINGE | Freq: Once | INTRAMUSCULAR | Status: AC
  Administered 2022-10-03: 0.5 mL via INTRAMUSCULAR
  Filled 2022-10-03: qty 0.5

## 2022-10-03 MED ORDER — METHYLPREDNISOLONE NA SUC (PF) 125 MG IJ SOLR
125.0000 mg | Freq: Once | INTRAMUSCULAR | Status: AC
  Administered 2022-10-03: 125 mg via INTRAVENOUS
  Filled 2022-10-03: qty 2

## 2022-10-03 MED ORDER — ACETAMINOPHEN 325 MG PO TABS
650.0000 mg | ORAL_TABLET | Freq: Once | ORAL | Status: AC
  Administered 2022-10-03: 650 mg via ORAL
  Filled 2022-10-03: qty 2

## 2022-10-03 MED ORDER — DIPHENHYDRAMINE HCL 25 MG PO CAPS
50.0000 mg | ORAL_CAPSULE | Freq: Once | ORAL | Status: AC
  Administered 2022-10-03: 50 mg via ORAL
  Filled 2022-10-03: qty 2

## 2022-10-03 NOTE — Progress Notes (Signed)
Hematology and Oncology Follow Up Visit  Daisy Meyers ON:[id number] 1996/06/17 26 y.o. 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  She presented with menorrhagia platelet count of 19 with recurrent disease despite prednisone therapy.   Prior Therapy:   She received packed red cell transfusion as well as IV Solu-Medrol during hospitalization on July 25, 2022.  Prednisone 60 mg daily started in August 2023.  She developed disease relapse in September 2023 after prednisone dose was lowered to 10 mg.  Current therapy:   Prednisone 60 mg restarted in September 2023.  She is currently on 40 mg daily.  Rituximab started on September 19, 2022.  Today is cycle 3 of therapy.  Interim History: Daisy Meyers returns today for a follow-up.  Since the last visit, her prednisone dose was increased again and has been tapered slowly in addition to adding rituximab.  She denies any nausea, fatigue or infusion related reaction with rituximab.  She denies any hematochezia, melena or hemoptysis.     Medications: Reviewed without changes. Current Outpatient Medications  Medication Sig Dispense Refill   Ferrous Sulfate (IRON PO) Take 1 tablet by mouth daily.     predniSONE (DELTASONE) 20 MG tablet Take 60 mg daily total dose. 90 tablet 3   No current facility-administered medications for this visit.     Allergies:  Allergies  Allergen Reactions   Rituxan [Rituximab] Anaphylaxis    Hypersensitivity Reaction:  See progress note: 09/19/2022    No Known Allergies      Physical Exam: Blood pressure 127/83, pulse 97, temperature 98 F (36.7 C), temperature source Temporal, resp. rate 19, height 5\' 5"  (1.651 m), weight 246 lb (111.6 kg), last menstrual period 09/13/2022, SpO2 99 %.   ECOG: 0   General appearance: Comfortable appearing without any discomfort Head: Normocephalic without any  trauma Oropharynx: Mucous membranes are moist and pink without any thrush or ulcers. Eyes: Pupils are equal and round reactive to light. Lymph nodes: No cervical, supraclavicular, inguinal or axillary lymphadenopathy.   Heart:regular rate and rhythm.  S1 and S2 without leg edema. Lung: Clear without any rhonchi or wheezes.  No dullness to percussion. Abdomin: Soft, nontender, nondistended with good bowel sounds.  No hepatosplenomegaly. Musculoskeletal: No joint deformity or effusion.  Full range of motion noted. Neurological: No deficits noted on motor, sensory and deep tendon reflex exam. Skin: No petechial rash or dryness.  Appeared moist.       Lab Results: Lab Results  Component Value Date   WBC 15.3 (H) 09/26/2022   HGB 13.4 09/26/2022   HCT 39.4 09/26/2022   MCV 82.1 09/26/2022   PLT 220 09/26/2022     Chemistry      Component Value Date/Time   NA 138 09/19/2022 1109   NA 135 05/25/2020 1643   K 3.8 09/19/2022 1109   CL 104 09/19/2022 1109   CO2 24 09/19/2022 1109   BUN 16 09/19/2022 1109   BUN 8 05/25/2020 1643   CREATININE 1.18 (H) 09/19/2022 1109      Component Value Date/Time   CALCIUM 8.8 (L) 09/19/2022 1109   ALKPHOS 68 09/19/2022 1109   AST 10 (L) 09/19/2022 1109   ALT 12 09/19/2022 1109   BILITOT 0.4 09/19/2022 1109         Impression and Plan:  26 year old with:  1.  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  She did not  tolerate the prednisone taper with quick relapse.  She is a currently achieved complete response to higher doses of prednisone currently on slow taper.  Her platelet count today continues to be within normal range and I recommended continued taper by 10 mg a week and she will complete rituximab infusion today and next week for a total of 4 treatment.  She develops relapse despite rituximab and prednisone that a splenectomy could be considered.   2.  Anemia: Related to menorrhagia has resolved at  this time.  3.  Follow-up: She will return next week to complete rituximab treatment and continue weekly for labs and MD follow-up in the next 3 to 4 weeks.  30  minutes were dedicated to this visit.  The time was spent on reviewing laboratory data, disease status update and outlining future plan of care discussion.    Daisy Button, MD 10/20/202310:58 AM

## 2022-10-03 NOTE — Patient Instructions (Signed)
[facility] MEDICAL [facility]COLOGY  Discharge Instructions: Thank you for choosing [facility] to provide your oncology and hematology care.   If you have a lab appointment with the Cancer Center, please go directly to the Cancer Center and check in at the registration area.   Wear comfortable clothing and clothing appropriate for easy access to any Portacath or PICC line.   We strive to give you quality time with your provider. You may need to reschedule your appointment if you arrive late (15 or more minutes).  Arriving late affects you and other patients whose appointments are after yours.  Also, if you miss three or more appointments without notifying the office, you may be dismissed from the clinic at the provider's discretion.      For prescription refill requests, have your pharmacy contact our office and allow 72 hours for refills to be completed.    Today you received the following chemotherapy and/or immunotherapy agents : Rituximab      To help prevent nausea and vomiting after your treatment, we encourage you to take your nausea medication as directed.  BELOW ARE SYMPTOMS THAT SHOULD BE REPORTED IMMEDIATELY: *FEVER GREATER THAN 100.4 F (38 C) OR HIGHER *CHILLS OR SWEATING *NAUSEA AND VOMITING THAT IS NOT CONTROLLED WITH YOUR NAUSEA MEDICATION *UNUSUAL SHORTNESS OF BREATH *UNUSUAL BRUISING OR BLEEDING *URINARY PROBLEMS (pain or burning when urinating, or frequent urination) *BOWEL PROBLEMS (unusual diarrhea, constipation, pain near the anus) TENDERNESS IN MOUTH AND THROAT WITH OR WITHOUT PRESENCE OF ULCERS (sore throat, sores in mouth, or a toothache) UNUSUAL RASH, SWELLING OR PAIN  UNUSUAL VAGINAL DISCHARGE OR ITCHING   Items with * indicate a potential emergency and should be followed up as soon as possible or go to the Emergency Department if any problems should occur.  Please show the CHEMOTHERAPY ALERT CARD or IMMUNOTHERAPY ALERT CARD at check-in to  the Emergency Department and triage nurse.  Should you have questions after your visit or need to cancel or reschedule your appointment, please contact [facility]  Dept: [phone]  and follow the prompts.  Office hours are 8:00 a.m. to 4:30 p.m. Monday - Friday. Please note that voicemails left after 4:00 p.m. may not be returned until the following business day.  We are closed weekends and major holidays. You have access to a nurse at all times for urgent questions. Please call the main number to the clinic Dept: [phone] and follow the prompts.   For any non-urgent questions, you may also contact your provider using MyChart. We now offer e-Visits for anyone 18 and older to request care online for non-urgent symptoms. For details visit [email or web address].   Also download the MyChart app! Go to the app store, search "MyChart", open the app, select [facility], and log in with your MyChart username and password.  Masks are optional in the cancer centers. If you would like for your care team to wear a mask while they are taking care of you, please let them know. You may have one support person who is at least 28 years old accompany you for your appointments. 

## 2022-10-06 ENCOUNTER — Encounter: Payer: Self-pay | Admitting: Physician Assistant

## 2022-10-06 ENCOUNTER — Telehealth: Payer: Self-pay | Admitting: *Deleted

## 2022-10-06 ENCOUNTER — Other Ambulatory Visit: Payer: Self-pay | Admitting: Oncology

## 2022-10-06 DIAGNOSIS — D696 Thrombocytopenia, unspecified: Secondary | ICD-10-CM

## 2022-10-06 DIAGNOSIS — R61 Generalized hyperhidrosis: Secondary | ICD-10-CM | POA: Diagnosis not present

## 2022-10-06 DIAGNOSIS — Z79899 Other long term (current) drug therapy: Secondary | ICD-10-CM | POA: Diagnosis not present

## 2022-10-06 DIAGNOSIS — R0789 Other chest pain: Secondary | ICD-10-CM | POA: Diagnosis not present

## 2022-10-06 DIAGNOSIS — D693 Immune thrombocytopenic purpura: Secondary | ICD-10-CM | POA: Diagnosis not present

## 2022-10-06 NOTE — Telephone Encounter (Signed)
Returned PC to patient, no answer, left VM - patient left message earlier stating she had an episode of chest pain this a.m. which last approximately 2 minutes & radiated into her ears. She states the pain has subsided but was calling for advice.  Advised patient to be evaluated at ED ASAP.  Instructed patient to call this office with any further questions/concerns, [phone].

## 2022-10-10 ENCOUNTER — Other Ambulatory Visit: Payer: Self-pay

## 2022-10-10 ENCOUNTER — Ambulatory Visit: Payer: BC Managed Care – PPO | Admitting: Oncology

## 2022-10-10 ENCOUNTER — Encounter: Payer: Self-pay | Admitting: Oncology

## 2022-10-10 ENCOUNTER — Inpatient Hospital Stay: Payer: BC Managed Care – PPO

## 2022-10-10 VITALS — BP 135/66 | HR 93 | Temp 98.3°F | Resp 16

## 2022-10-10 DIAGNOSIS — D693 Immune thrombocytopenic purpura: Secondary | ICD-10-CM | POA: Diagnosis not present

## 2022-10-10 DIAGNOSIS — D696 Thrombocytopenia, unspecified: Secondary | ICD-10-CM

## 2022-10-10 DIAGNOSIS — Z79899 Other long term (current) drug therapy: Secondary | ICD-10-CM | POA: Diagnosis not present

## 2022-10-10 DIAGNOSIS — D500 Iron deficiency anemia secondary to blood loss (chronic): Secondary | ICD-10-CM | POA: Diagnosis not present

## 2022-10-10 DIAGNOSIS — Z23 Encounter for immunization: Secondary | ICD-10-CM | POA: Diagnosis not present

## 2022-10-10 DIAGNOSIS — Z5112 Encounter for antineoplastic immunotherapy: Secondary | ICD-10-CM | POA: Diagnosis not present

## 2022-10-10 DIAGNOSIS — N920 Excessive and frequent menstruation with regular cycle: Secondary | ICD-10-CM | POA: Diagnosis not present

## 2022-10-10 DIAGNOSIS — D649 Anemia, unspecified: Secondary | ICD-10-CM

## 2022-10-10 DIAGNOSIS — Z7952 Long term (current) use of systemic steroids: Secondary | ICD-10-CM | POA: Diagnosis not present

## 2022-10-10 LAB — CBC WITH DIFFERENTIAL (CANCER CENTER ONLY)
Abs Immature Granulocytes: 0.02 10*3/uL (ref 0.00–0.07)
Basophils Absolute: 0 10*3/uL (ref 0.0–0.1)
Basophils Relative: 0 %
Eosinophils Absolute: 0.1 10*3/uL (ref 0.0–0.5)
Eosinophils Relative: 1 %
HCT: 38.1 % (ref 36.0–46.0)
Hemoglobin: 12.9 g/dL (ref 12.0–15.0)
Immature Granulocytes: 0 %
Lymphocytes Relative: 37 %
Lymphs Abs: 3.7 10*3/uL (ref 0.7–4.0)
MCH: 28.5 pg (ref 26.0–34.0)
MCHC: 33.9 g/dL (ref 30.0–36.0)
MCV: 84.3 fL (ref 80.0–100.0)
Monocytes Absolute: 0.6 10*3/uL (ref 0.1–1.0)
Monocytes Relative: 6 %
Neutro Abs: 5.6 10*3/uL (ref 1.7–7.7)
Neutrophils Relative %: 56 %
Platelet Count: 187 10*3/uL (ref 150–400)
RBC: 4.52 MIL/uL (ref 3.87–5.11)
RDW: 14.6 % (ref 11.5–15.5)
WBC Count: 10 10*3/uL (ref 4.0–10.5)
nRBC: 0 % (ref 0.0–0.2)

## 2022-10-10 MED ORDER — METHYLPREDNISOLONE NA SUC (PF) 125 MG IJ SOLR
125.0000 mg | Freq: Once | INTRAMUSCULAR | Status: AC
  Administered 2022-10-10: 125 mg via INTRAVENOUS

## 2022-10-10 MED ORDER — ACETAMINOPHEN 325 MG PO TABS
650.0000 mg | ORAL_TABLET | Freq: Once | ORAL | Status: AC
  Administered 2022-10-10: 650 mg via ORAL
  Filled 2022-10-10: qty 2

## 2022-10-10 MED ORDER — FAMOTIDINE IN NACL 20-0.9 MG/50ML-% IV SOLN
20.0000 mg | Freq: Once | INTRAVENOUS | Status: AC
  Administered 2022-10-10: 20 mg via INTRAVENOUS
  Filled 2022-10-10: qty 50

## 2022-10-10 MED ORDER — SODIUM CHLORIDE 0.9 % IV SOLN: Freq: Once | INTRAVENOUS | Status: AC

## 2022-10-10 MED ORDER — SODIUM CHLORIDE 0.9 % IV SOLN
375.0000 mg/m2 | Freq: Once | INTRAVENOUS | Status: AC
  Administered 2022-10-10: 800 mg via INTRAVENOUS
  Filled 2022-10-10: qty 50

## 2022-10-10 MED ORDER — METHYLPREDNISOLONE NA SUC (PF) 125 MG IJ SOLR
125.0000 mg | Freq: Once | INTRAMUSCULAR | Status: DC
  Filled 2022-10-10: qty 2

## 2022-10-10 MED ORDER — DIPHENHYDRAMINE HCL 25 MG PO CAPS
50.0000 mg | ORAL_CAPSULE | Freq: Once | ORAL | Status: AC
  Administered 2022-10-10: 50 mg via ORAL
  Filled 2022-10-10: qty 2

## 2022-10-10 NOTE — Patient Instructions (Signed)
[facility]  Discharge Instructions: Thank you for choosing [facility] to provide your oncology and hematology care.   If you have a lab appointment with the [facility], please go directly to the [facility] and check in at the registration area.   Wear comfortable clothing and clothing appropriate for easy access to any Portacath or PICC line.   We strive to give you quality time with your provider. You may need to reschedule your appointment if you arrive late (15 or more minutes).  Arriving late affects you and other patients whose appointments are after yours.  Also, if you miss three or more appointments without notifying the office, you may be dismissed from the clinic at the provider's discretion.      For prescription refill requests, have your pharmacy contact our office and allow 72 hours for refills to be completed.    Today you received the following chemotherapy and/or immunotherapy agents: Ruxience      To help prevent nausea and vomiting after your treatment, we encourage you to take your nausea medication as directed.  BELOW ARE SYMPTOMS THAT SHOULD BE REPORTED IMMEDIATELY: *FEVER GREATER THAN 100.4 F (38 C) OR HIGHER *CHILLS OR SWEATING *NAUSEA AND VOMITING THAT IS NOT CONTROLLED WITH YOUR NAUSEA MEDICATION *UNUSUAL SHORTNESS OF BREATH *UNUSUAL BRUISING OR BLEEDING *URINARY PROBLEMS (pain or burning when urinating, or frequent urination) *BOWEL PROBLEMS (unusual diarrhea, constipation, pain near the anus) TENDERNESS IN MOUTH AND THROAT WITH OR WITHOUT PRESENCE OF ULCERS (sore throat, sores in mouth, or a toothache) UNUSUAL RASH, SWELLING OR PAIN  UNUSUAL VAGINAL DISCHARGE OR ITCHING   Items with * indicate a potential emergency and should be followed up as soon as possible or go to the Emergency Department if any problems should occur.  Please show the CHEMOTHERAPY ALERT CARD or IMMUNOTHERAPY ALERT CARD at check-in to  the Emergency Department and triage nurse.  Should you have questions after your visit or need to cancel or reschedule your appointment, please contact [facility]  Dept: [phone]  and follow the prompts.  Office hours are 8:00 a.m. to 4:30 p.m. Monday - Friday. Please note that voicemails left after 4:00 p.m. may not be returned until the following business day.  We are closed weekends and major holidays. You have access to a nurse at all times for urgent questions. Please call the main number to the clinic Dept: [phone] and follow the prompts.   For any non-urgent questions, you may also contact your provider using MyChart. We now offer e-Visits for anyone 86 and older to request care online for non-urgent symptoms. For details visit [email or web address].   Also download the MyChart app! Go to the app store, search "MyChart", open the app, select [facility], and log in with your MyChart username and password.  Masks are optional in the cancer centers. If you would like for your care team to wear a mask while they are taking care of you, please let them know. You may have one support person who is at least 28 years old accompany you for your appointments.

## 2022-10-17 ENCOUNTER — Telehealth: Payer: Self-pay | Admitting: *Deleted

## 2022-10-17 ENCOUNTER — Inpatient Hospital Stay: Payer: BC Managed Care – PPO | Attending: Oncology

## 2022-10-17 DIAGNOSIS — D696 Thrombocytopenia, unspecified: Secondary | ICD-10-CM

## 2022-10-17 DIAGNOSIS — D693 Immune thrombocytopenic purpura: Secondary | ICD-10-CM | POA: Diagnosis not present

## 2022-10-17 DIAGNOSIS — Z7952 Long term (current) use of systemic steroids: Secondary | ICD-10-CM | POA: Diagnosis not present

## 2022-10-17 DIAGNOSIS — N920 Excessive and frequent menstruation with regular cycle: Secondary | ICD-10-CM | POA: Insufficient documentation

## 2022-10-17 DIAGNOSIS — D649 Anemia, unspecified: Secondary | ICD-10-CM | POA: Insufficient documentation

## 2022-10-17 DIAGNOSIS — Z79899 Other long term (current) drug therapy: Secondary | ICD-10-CM | POA: Insufficient documentation

## 2022-10-17 DIAGNOSIS — Z5112 Encounter for antineoplastic immunotherapy: Secondary | ICD-10-CM | POA: Diagnosis not present

## 2022-10-17 LAB — CBC WITH DIFFERENTIAL (CANCER CENTER ONLY)
Abs Immature Granulocytes: 0.04 10*3/uL (ref 0.00–0.07)
Basophils Absolute: 0 10*3/uL (ref 0.0–0.1)
Basophils Relative: 0 %
Eosinophils Absolute: 0 10*3/uL (ref 0.0–0.5)
Eosinophils Relative: 0 %
HCT: 38.6 % (ref 36.0–46.0)
Hemoglobin: 13.3 g/dL (ref 12.0–15.0)
Immature Granulocytes: 0 %
Lymphocytes Relative: 17 %
Lymphs Abs: 1.8 10*3/uL (ref 0.7–4.0)
MCH: 28.7 pg (ref 26.0–34.0)
MCHC: 34.5 g/dL (ref 30.0–36.0)
MCV: 83.4 fL (ref 80.0–100.0)
Monocytes Absolute: 0.3 10*3/uL (ref 0.1–1.0)
Monocytes Relative: 3 %
Neutro Abs: 8.7 10*3/uL — ABNORMAL HIGH (ref 1.7–7.7)
Neutrophils Relative %: 80 %
Platelet Count: 113 10*3/uL — ABNORMAL LOW (ref 150–400)
RBC: 4.63 MIL/uL (ref 3.87–5.11)
RDW: 14 % (ref 11.5–15.5)
WBC Count: 10.9 10*3/uL — ABNORMAL HIGH (ref 4.0–10.5)
nRBC: 0 % (ref 0.0–0.2)

## 2022-10-17 NOTE — Telephone Encounter (Signed)
-----   Message from Wyatt Portela, MD sent at 10/17/2022  2:12 PM EDT ----- Please let her know to continue on the current dose of prednisone. Not to taper given the drop in her platelets.

## 2022-10-17 NOTE — Telephone Encounter (Signed)
PC to patient, no answer, left VM - informed her of Dr Hazeline Junker instructions below, she is to continue on current dose of prednisone, do not decrease the dose at this time.  Instructed patient to call this office with any questions/concerns, [phone].

## 2022-10-24 ENCOUNTER — Telehealth: Payer: Self-pay | Admitting: *Deleted

## 2022-10-24 ENCOUNTER — Inpatient Hospital Stay: Payer: BC Managed Care – PPO

## 2022-10-24 DIAGNOSIS — D649 Anemia, unspecified: Secondary | ICD-10-CM | POA: Diagnosis not present

## 2022-10-24 DIAGNOSIS — D693 Immune thrombocytopenic purpura: Secondary | ICD-10-CM | POA: Diagnosis not present

## 2022-10-24 DIAGNOSIS — Z5112 Encounter for antineoplastic immunotherapy: Secondary | ICD-10-CM | POA: Diagnosis not present

## 2022-10-24 DIAGNOSIS — Z7952 Long term (current) use of systemic steroids: Secondary | ICD-10-CM | POA: Diagnosis not present

## 2022-10-24 DIAGNOSIS — N920 Excessive and frequent menstruation with regular cycle: Secondary | ICD-10-CM | POA: Diagnosis not present

## 2022-10-24 DIAGNOSIS — Z79899 Other long term (current) drug therapy: Secondary | ICD-10-CM | POA: Diagnosis not present

## 2022-10-24 DIAGNOSIS — D696 Thrombocytopenia, unspecified: Secondary | ICD-10-CM

## 2022-10-24 LAB — CBC WITH DIFFERENTIAL (CANCER CENTER ONLY)
Abs Immature Granulocytes: 0.04 10*3/uL (ref 0.00–0.07)
Basophils Absolute: 0 10*3/uL (ref 0.0–0.1)
Basophils Relative: 0 %
Eosinophils Absolute: 0 10*3/uL (ref 0.0–0.5)
Eosinophils Relative: 0 %
HCT: 39.9 % (ref 36.0–46.0)
Hemoglobin: 13.4 g/dL (ref 12.0–15.0)
Immature Granulocytes: 0 %
Lymphocytes Relative: 13 %
Lymphs Abs: 1.3 10*3/uL (ref 0.7–4.0)
MCH: 28.6 pg (ref 26.0–34.0)
MCHC: 33.6 g/dL (ref 30.0–36.0)
MCV: 85.1 fL (ref 80.0–100.0)
Monocytes Absolute: 0.5 10*3/uL (ref 0.1–1.0)
Monocytes Relative: 5 %
Neutro Abs: 7.9 10*3/uL — ABNORMAL HIGH (ref 1.7–7.7)
Neutrophils Relative %: 82 %
Platelet Count: 175 10*3/uL (ref 150–400)
RBC: 4.69 MIL/uL (ref 3.87–5.11)
RDW: 14.1 % (ref 11.5–15.5)
WBC Count: 9.7 10*3/uL (ref 4.0–10.5)
nRBC: 0 % (ref 0.0–0.2)

## 2022-10-24 NOTE — Telephone Encounter (Signed)
-----   Message from Benjiman Core, MD sent at 10/24/2022  1:45 PM EST ----- Please let her know her platelets are good. She can continue prednisone taper (decrease the current dose by 10)

## 2022-10-24 NOTE — Telephone Encounter (Signed)
Notified of message below. Verbalized understanding 

## 2022-10-31 ENCOUNTER — Other Ambulatory Visit: Payer: BC Managed Care – PPO

## 2022-10-31 ENCOUNTER — Ambulatory Visit: Payer: BC Managed Care – PPO | Admitting: Oncology

## 2022-11-03 DIAGNOSIS — Z1389 Encounter for screening for other disorder: Secondary | ICD-10-CM | POA: Diagnosis not present

## 2022-11-03 DIAGNOSIS — Z01419 Encounter for gynecological examination (general) (routine) without abnormal findings: Secondary | ICD-10-CM | POA: Diagnosis not present

## 2022-11-03 DIAGNOSIS — Z130 Encounter for screening for diseases of the blood and blood-forming organs and certain disorders involving the immune mechanism: Secondary | ICD-10-CM | POA: Diagnosis not present

## 2022-11-03 DIAGNOSIS — Z6841 Body Mass Index (BMI) 40.0 and over, adult: Secondary | ICD-10-CM | POA: Diagnosis not present

## 2022-11-04 ENCOUNTER — Inpatient Hospital Stay (HOSPITAL_BASED_OUTPATIENT_CLINIC_OR_DEPARTMENT_OTHER): Payer: BC Managed Care – PPO | Admitting: Oncology

## 2022-11-04 ENCOUNTER — Telehealth: Payer: Self-pay | Admitting: Oncology

## 2022-11-04 ENCOUNTER — Inpatient Hospital Stay: Payer: BC Managed Care – PPO

## 2022-11-04 VITALS — BP 134/87 | HR 114 | Temp 98.4°F | Resp 16

## 2022-11-04 DIAGNOSIS — Z7952 Long term (current) use of systemic steroids: Secondary | ICD-10-CM | POA: Diagnosis not present

## 2022-11-04 DIAGNOSIS — D693 Immune thrombocytopenic purpura: Secondary | ICD-10-CM | POA: Diagnosis not present

## 2022-11-04 DIAGNOSIS — Z5112 Encounter for antineoplastic immunotherapy: Secondary | ICD-10-CM | POA: Diagnosis not present

## 2022-11-04 DIAGNOSIS — D649 Anemia, unspecified: Secondary | ICD-10-CM

## 2022-11-04 DIAGNOSIS — D696 Thrombocytopenia, unspecified: Secondary | ICD-10-CM | POA: Diagnosis not present

## 2022-11-04 DIAGNOSIS — Z79899 Other long term (current) drug therapy: Secondary | ICD-10-CM | POA: Diagnosis not present

## 2022-11-04 DIAGNOSIS — N920 Excessive and frequent menstruation with regular cycle: Secondary | ICD-10-CM | POA: Diagnosis not present

## 2022-11-04 LAB — CBC WITH DIFFERENTIAL (CANCER CENTER ONLY)
Abs Immature Granulocytes: 0.05 10*3/uL (ref 0.00–0.07)
Basophils Absolute: 0 10*3/uL (ref 0.0–0.1)
Basophils Relative: 0 %
Eosinophils Absolute: 0 10*3/uL (ref 0.0–0.5)
Eosinophils Relative: 0 %
HCT: 38.2 % (ref 36.0–46.0)
Hemoglobin: 13.1 g/dL (ref 12.0–15.0)
Immature Granulocytes: 0 %
Lymphocytes Relative: 12 %
Lymphs Abs: 1.3 10*3/uL (ref 0.7–4.0)
MCH: 28.6 pg (ref 26.0–34.0)
MCHC: 34.3 g/dL (ref 30.0–36.0)
MCV: 83.4 fL (ref 80.0–100.0)
Monocytes Absolute: 0.4 10*3/uL (ref 0.1–1.0)
Monocytes Relative: 4 %
Neutro Abs: 9.5 10*3/uL — ABNORMAL HIGH (ref 1.7–7.7)
Neutrophils Relative %: 84 %
Platelet Count: 173 10*3/uL (ref 150–400)
RBC: 4.58 MIL/uL (ref 3.87–5.11)
RDW: 13.2 % (ref 11.5–15.5)
WBC Count: 11.3 10*3/uL — ABNORMAL HIGH (ref 4.0–10.5)
nRBC: 0 % (ref 0.0–0.2)

## 2022-11-04 NOTE — Telephone Encounter (Signed)
Scheduled per 11/21 los, patient is notified of providers departure. Scheduled follow-up with new provider in February.

## 2022-11-04 NOTE — Progress Notes (Signed)
Hematology and Oncology Follow Up Visit  Daisy Meyers [id number] 08-29-1996 26 y.o. 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     Prior Therapy:   She received packed red cell transfusion as well as IV Solu-Medrol during hospitalization on July 25, 2022.  Prednisone 60 mg daily started in August 2023.  She developed disease relapse in September 2023 after prednisone dose was lowered to 10 mg.  Rituximab started on September 19, 2022.  She received 375 mg/m   Current therapy:   Prednisone 60 mg restarted in September 2023.  She is on prednisone taper currently at 10 mg daily.    Interim History: Ms. Polivka returns today for repeat evaluation.  Since last visit, she completed the course of rituximab without any major issues or complaints.  She denies any hematochezia, melena or hemoptysis.  She reports regular menstrual cycles are not heavy at this time.  She is scheduled to have ACL repair surgery in December.     Medications: Updated on review. Current Outpatient Medications  Medication Sig Dispense Refill   Ferrous Sulfate (IRON PO) Take 1 tablet by mouth daily.     predniSONE (DELTASONE) 10 MG tablet TAKE 1 TABLET BY MOUTH EVERY DAY 50 tablet 0   predniSONE (DELTASONE) 20 MG tablet Take 60 mg daily total dose. 90 tablet 3   No current facility-administered medications for this visit.     Allergies:  Allergies  Allergen Reactions   Rituxan [Rituximab] Anaphylaxis    Hypersensitivity Reaction:  See progress note: 09/19/2022    No Known Allergies      Physical Exam:  Blood pressure 134/87, pulse (!) 114, temperature 98.4 F (36.9 C), temperature source Oral, resp. rate 16, SpO2 98 %.   ECOG: 0   General appearance: Alert, awake without any distress. Head: Atraumatic without abnormalities Oropharynx: Without  any thrush or ulcers. Eyes: No scleral icterus. Lymph nodes: No lymphadenopathy noted in the cervical, supraclavicular, or axillary nodes Heart:regular rate and rhythm, without any murmurs or gallops.   Lung: Clear to auscultation without any rhonchi, wheezes or dullness to percussion. Abdomin: Soft, nontender without any shifting dullness or ascites. Musculoskeletal: No clubbing or cyanosis. Neurological: No motor or sensory deficits. Skin: No rashes or lesions.      Lab Results: Lab Results  Component Value Date   WBC 9.7 10/24/2022   HGB 13.4 10/24/2022   HCT 39.9 10/24/2022   MCV 85.1 10/24/2022   PLT 175 10/24/2022     Chemistry      Component Value Date/Time   NA 138 09/19/2022 1109   NA 135 05/25/2020 1643   K 3.8 09/19/2022 1109   CL 104 09/19/2022 1109   CO2 24 09/19/2022 1109   BUN 16 09/19/2022 1109   BUN 8 05/25/2020 1643   CREATININE 1.18 (H) 09/19/2022 1109      Component Value Date/Time   CALCIUM 8.8 (L) 09/19/2022 1109   ALKPHOS 68 09/19/2022 1109   AST 10 (L) 09/19/2022 1109   ALT 12 09/19/2022 1109   BILITOT 0.4 09/19/2022 1109         Impression and Plan:  26 year old with:  1.  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  She did not tolerate the prednisone taper with quick relapse.  She remains on prednisone taper without platelet count continues to be within normal  range after completing rituximab course.  Laboratory data from today reviewed and showed a platelet count of 173.  Tapering doses of prednisone will continue slowly.  Different salvage therapy options including splenectomy and Nplate could be considered.  At this time, I recommended continuing prednisone taper and will start 5 mg daily for a week then she will discontinue.   2.  Anemia: Her hemoglobin is back to normal range.  This is related to previous heavy menstrual cycles.  3.  Follow-up: She will follow-up with monthly labs and follow-up  in the next 2 to 3 months.  30  minutes were spent on this encounter.  The time was dedicated to updating disease status, treatment choices and outlining future plan of care discussion.    [name], MD 11/21/202312:46 PM

## 2022-11-14 ENCOUNTER — Inpatient Hospital Stay: Payer: BC Managed Care – PPO | Attending: Oncology

## 2022-11-14 DIAGNOSIS — D649 Anemia, unspecified: Secondary | ICD-10-CM | POA: Diagnosis not present

## 2022-11-14 DIAGNOSIS — D696 Thrombocytopenia, unspecified: Secondary | ICD-10-CM | POA: Insufficient documentation

## 2022-11-14 DIAGNOSIS — Z79899 Other long term (current) drug therapy: Secondary | ICD-10-CM | POA: Insufficient documentation

## 2022-11-14 LAB — CBC WITH DIFFERENTIAL (CANCER CENTER ONLY)
Abs Immature Granulocytes: 0.03 10*3/uL (ref 0.00–0.07)
Basophils Absolute: 0 10*3/uL (ref 0.0–0.1)
Basophils Relative: 0 %
Eosinophils Absolute: 0 10*3/uL (ref 0.0–0.5)
Eosinophils Relative: 0 %
HCT: 38.3 % (ref 36.0–46.0)
Hemoglobin: 12.9 g/dL (ref 12.0–15.0)
Immature Granulocytes: 0 %
Lymphocytes Relative: 13 %
Lymphs Abs: 1.6 10*3/uL (ref 0.7–4.0)
MCH: 28.2 pg (ref 26.0–34.0)
MCHC: 33.7 g/dL (ref 30.0–36.0)
MCV: 83.8 fL (ref 80.0–100.0)
Monocytes Absolute: 0.6 10*3/uL (ref 0.1–1.0)
Monocytes Relative: 5 %
Neutro Abs: 9.6 10*3/uL — ABNORMAL HIGH (ref 1.7–7.7)
Neutrophils Relative %: 82 %
Platelet Count: 142 10*3/uL — ABNORMAL LOW (ref 150–400)
RBC: 4.57 MIL/uL (ref 3.87–5.11)
RDW: 12.8 % (ref 11.5–15.5)
WBC Count: 11.8 10*3/uL — ABNORMAL HIGH (ref 4.0–10.5)
nRBC: 0 % (ref 0.0–0.2)

## 2022-11-25 ENCOUNTER — Other Ambulatory Visit: Payer: Self-pay | Admitting: Oncology

## 2022-11-25 DIAGNOSIS — D696 Thrombocytopenia, unspecified: Secondary | ICD-10-CM

## 2022-12-19 ENCOUNTER — Telehealth: Payer: Self-pay | Admitting: *Deleted

## 2022-12-19 ENCOUNTER — Inpatient Hospital Stay: Payer: BC Managed Care – PPO | Attending: Oncology

## 2022-12-19 DIAGNOSIS — D649 Anemia, unspecified: Secondary | ICD-10-CM

## 2022-12-19 DIAGNOSIS — D696 Thrombocytopenia, unspecified: Secondary | ICD-10-CM | POA: Diagnosis present

## 2022-12-19 LAB — CBC WITH DIFFERENTIAL (CANCER CENTER ONLY)
Abs Immature Granulocytes: 0.02 10*3/uL (ref 0.00–0.07)
Basophils Absolute: 0 10*3/uL (ref 0.0–0.1)
Basophils Relative: 0 %
Eosinophils Absolute: 0.1 10*3/uL (ref 0.0–0.5)
Eosinophils Relative: 1 %
HCT: 36.1 % (ref 36.0–46.0)
Hemoglobin: 12.8 g/dL (ref 12.0–15.0)
Immature Granulocytes: 0 %
Lymphocytes Relative: 22 %
Lymphs Abs: 2.2 10*3/uL (ref 0.7–4.0)
MCH: 29.1 pg (ref 26.0–34.0)
MCHC: 35.5 g/dL (ref 30.0–36.0)
MCV: 82 fL (ref 80.0–100.0)
Monocytes Absolute: 0.7 10*3/uL (ref 0.1–1.0)
Monocytes Relative: 7 %
Neutro Abs: 7.1 10*3/uL (ref 1.7–7.7)
Neutrophils Relative %: 70 %
Platelet Count: 72 10*3/uL — ABNORMAL LOW (ref 150–400)
RBC: 4.4 MIL/uL (ref 3.87–5.11)
RDW: 12.1 % (ref 11.5–15.5)
WBC Count: 10.1 10*3/uL (ref 4.0–10.5)
nRBC: 0 % (ref 0.0–0.2)

## 2022-12-19 NOTE — Telephone Encounter (Signed)
PC to patient, informed her platelets are 78, Dr Alen Blew wants her to have labs repeated next week.  Her lab appointment is 12/26/22 @ 12:30, patient verbalizes understanding.

## 2022-12-19 NOTE — Telephone Encounter (Signed)
-----   Message from Daisy Portela, MD sent at 12/19/2022  1:01 PM EST ----- Please let her know her platelets has dropped. We need to check her platelets again next week.

## 2022-12-26 ENCOUNTER — Telehealth: Payer: Self-pay | Admitting: *Deleted

## 2022-12-26 ENCOUNTER — Encounter: Payer: Self-pay | Admitting: Oncology

## 2022-12-26 ENCOUNTER — Inpatient Hospital Stay: Payer: BC Managed Care – PPO

## 2022-12-26 DIAGNOSIS — D649 Anemia, unspecified: Secondary | ICD-10-CM

## 2022-12-26 DIAGNOSIS — D696 Thrombocytopenia, unspecified: Secondary | ICD-10-CM | POA: Diagnosis not present

## 2022-12-26 LAB — CBC WITH DIFFERENTIAL (CANCER CENTER ONLY)
Abs Immature Granulocytes: 0.02 10*3/uL (ref 0.00–0.07)
Basophils Absolute: 0 10*3/uL (ref 0.0–0.1)
Basophils Relative: 0 %
Eosinophils Absolute: 0.2 10*3/uL (ref 0.0–0.5)
Eosinophils Relative: 2 %
HCT: 36.7 % (ref 36.0–46.0)
Hemoglobin: 12.8 g/dL (ref 12.0–15.0)
Immature Granulocytes: 0 %
Lymphocytes Relative: 14 %
Lymphs Abs: 1.2 10*3/uL (ref 0.7–4.0)
MCH: 28.3 pg (ref 26.0–34.0)
MCHC: 34.9 g/dL (ref 30.0–36.0)
MCV: 81 fL (ref 80.0–100.0)
Monocytes Absolute: 0.7 10*3/uL (ref 0.1–1.0)
Monocytes Relative: 8 %
Neutro Abs: 6.1 10*3/uL (ref 1.7–7.7)
Neutrophils Relative %: 76 %
Platelet Count: 46 10*3/uL — ABNORMAL LOW (ref 150–400)
RBC: 4.53 MIL/uL (ref 3.87–5.11)
RDW: 12.1 % (ref 11.5–15.5)
WBC Count: 8.1 10*3/uL (ref 4.0–10.5)
nRBC: 0 % (ref 0.0–0.2)

## 2022-12-26 MED ORDER — PREDNISONE 20 MG PO TABS: ORAL_TABLET | ORAL | 1 refills | Status: DC

## 2022-12-26 NOTE — Telephone Encounter (Signed)
-----  Message from Wyatt Portela, MD sent at 12/26/2022  1:44 PM EST ----- Please let her know her platelets are down. She needs to go back on Prednisone 60 till her next appointment

## 2022-12-26 NOTE — Telephone Encounter (Signed)
Notified of message below

## 2023-01-14 ENCOUNTER — Other Ambulatory Visit: Payer: Self-pay | Admitting: *Deleted

## 2023-01-14 DIAGNOSIS — D696 Thrombocytopenia, unspecified: Secondary | ICD-10-CM

## 2023-01-16 ENCOUNTER — Inpatient Hospital Stay (HOSPITAL_BASED_OUTPATIENT_CLINIC_OR_DEPARTMENT_OTHER): Payer: BC Managed Care – PPO | Admitting: Oncology

## 2023-01-16 ENCOUNTER — Inpatient Hospital Stay: Payer: BC Managed Care – PPO | Attending: Oncology

## 2023-01-16 VITALS — BP 130/77 | HR 96 | Temp 98.1°F | Resp 18 | Ht 65.0 in | Wt 251.1 lb

## 2023-01-16 DIAGNOSIS — D693 Immune thrombocytopenic purpura: Secondary | ICD-10-CM | POA: Insufficient documentation

## 2023-01-16 DIAGNOSIS — R0600 Dyspnea, unspecified: Secondary | ICD-10-CM | POA: Insufficient documentation

## 2023-01-16 DIAGNOSIS — N920 Excessive and frequent menstruation with regular cycle: Secondary | ICD-10-CM | POA: Insufficient documentation

## 2023-01-16 DIAGNOSIS — D500 Iron deficiency anemia secondary to blood loss (chronic): Secondary | ICD-10-CM | POA: Diagnosis not present

## 2023-01-16 DIAGNOSIS — D696 Thrombocytopenia, unspecified: Secondary | ICD-10-CM | POA: Diagnosis not present

## 2023-01-16 DIAGNOSIS — G4700 Insomnia, unspecified: Secondary | ICD-10-CM | POA: Diagnosis not present

## 2023-01-16 DIAGNOSIS — Z7952 Long term (current) use of systemic steroids: Secondary | ICD-10-CM | POA: Insufficient documentation

## 2023-01-16 DIAGNOSIS — Z7289 Other problems related to lifestyle: Secondary | ICD-10-CM | POA: Insufficient documentation

## 2023-01-16 DIAGNOSIS — R5383 Other fatigue: Secondary | ICD-10-CM | POA: Diagnosis not present

## 2023-01-16 DIAGNOSIS — Z79899 Other long term (current) drug therapy: Secondary | ICD-10-CM | POA: Insufficient documentation

## 2023-01-16 LAB — CBC WITH DIFFERENTIAL (CANCER CENTER ONLY)
Abs Immature Granulocytes: 0.03 10*3/uL (ref 0.00–0.07)
Basophils Absolute: 0 10*3/uL (ref 0.0–0.1)
Basophils Relative: 0 %
Eosinophils Absolute: 0.1 10*3/uL (ref 0.0–0.5)
Eosinophils Relative: 1 %
HCT: 38 % (ref 36.0–46.0)
Hemoglobin: 12.9 g/dL (ref 12.0–15.0)
Immature Granulocytes: 0 %
Lymphocytes Relative: 32 %
Lymphs Abs: 3.7 10*3/uL (ref 0.7–4.0)
MCH: 27.8 pg (ref 26.0–34.0)
MCHC: 33.9 g/dL (ref 30.0–36.0)
MCV: 81.9 fL (ref 80.0–100.0)
Monocytes Absolute: 0.9 10*3/uL (ref 0.1–1.0)
Monocytes Relative: 8 %
Neutro Abs: 7 10*3/uL (ref 1.7–7.7)
Neutrophils Relative %: 59 %
Platelet Count: 191 10*3/uL (ref 150–400)
RBC: 4.64 MIL/uL (ref 3.87–5.11)
RDW: 12.3 % (ref 11.5–15.5)
WBC Count: 11.8 10*3/uL — ABNORMAL HIGH (ref 4.0–10.5)
nRBC: 0 % (ref 0.0–0.2)

## 2023-01-16 NOTE — Progress Notes (Signed)
[facility] OFFICE PROGRESS NOTE   Diagnosis: ITP  INTERVAL HISTORY:   Daisy Meyers reports developing heavy menses during the summer 2023.  She presented to her gynecologist in August 2023 when she developed fatigue and dyspnea.  She was found to have severe anemia and thrombocytopenia.  She was referred to the emergency room.  On 07/25/2022 the hemoglobin returned at 4.9, MCV 79.1, platelets 19,000, and WBC 9.0 with an ANC of 6.1.  The ferritin returned at 9 on 08/01/2022.  Dr. Alen Blew was consulted on 07/26/2022.  She was transfused with packed red blood cells.  She was diagnosed with ITP.  She was started on Solu-Medrol and transition to prednisone.  The platelet count dropped when the prednisone was tapered.  Prednisone was resumed at a dose of 60 mg daily 09/05/2022 and she was treated with 4 doses of weekly rituximab beginning 09/19/2022.  The prednisone was again tapered at the completion of rituximab.  The platelet count was down to 46,000 on 12/26/2022 and prednisone was resumed at a dose of 60 mg daily (she reports taking 40 mg daily).  She had a menstrual cycle lasting from 12/23/2022 until 01/15/2023.  The bleeding was not heavy.  No other bleeding.  Past medical history: G0, P0  Past surgical history: Wisdom tooth extraction Pilonidal cyst surgery in 2013 Glass removed from the left foot in 2015  Family history: No family history of a hematologic condition  Social history: She lives with her fianc in [place].  She recently graduated from physical therapy school.  She does not use cigarettes.  Occasional alcohol use.  No risk factor for HIV or hepatitis.  Review of systems: Positives-insomnia and decreased visual acuity while on prednisone A complete review of systems was otherwise negative Objective:  Vital signs in last 24 hours:  Blood pressure 130/77, pulse 96, temperature 98.1 F (36.7 C), temperature source Oral, resp. rate 18, height 5\' 5"  (1.651 m),  weight 251 lb 1.6 oz (113.9 kg), SpO2 100 %.    HEENT: No thrush or bleeding Lymphatics: No cervical, supraclavicular, axillary, or inguinal nodes Resp: Lungs clear bilaterally Cardio: Regular rate and rhythm GI: No hepatosplenomegaly Vascular: No leg edema   Lab Results:  Lab Results  Component Value Date   WBC 11.8 (H) 01/16/2023   HGB 12.9 01/16/2023   HCT 38.0 01/16/2023   MCV 81.9 01/16/2023   PLT 191 01/16/2023   NEUTROABS 7.0 01/16/2023    CMP  Lab Results  Component Value Date   NA 138 09/19/2022   K 3.8 09/19/2022   CL 104 09/19/2022   CO2 24 09/19/2022   GLUCOSE 138 (H) 09/19/2022   BUN 16 09/19/2022   CREATININE 1.18 (H) 09/19/2022   CALCIUM 8.8 (L) 09/19/2022   PROT 6.9 09/19/2022   ALBUMIN 3.9 09/19/2022   AST 10 (L) 09/19/2022   ALT 12 09/19/2022   ALKPHOS 68 09/19/2022   BILITOT 0.4 09/19/2022   GFRNONAA >60 09/19/2022   GFRAA 101 05/25/2020     Medications: I have reviewed the patient's current medications.   Assessment/Plan: ITP presenting with platelet count 19,000 07/25/2022 Solu-Medrol followed by prednisone Relapse September 2023 with prednisone taper to 10 mg Rituximab 09/19/2022-4 weekly doses Prednisone taper following rituximab Prednisone taper to 5 mg daily 11/04/2022 Recurrent thrombocytopenia 12/19/2022, prednisone increased to 40 mg daily 12/26/2022 Prednisone decreased to 30 mg daily 01/16/2023  2.  Anemia secondary to menorrhagia and iron deficiency-improved   Disposition: Ms Carusone was diagnosed with ITP  when she presented with symptomatic anemia in August 2023.  The platelet count improved when she was placed on steroids, but declined during a steroid taper.  She completed rituximab beginning 09/19/2022 followed by another prednisone taper.  The platelet count fell when the prednisone was tapered.  Prednisone was increased to 40 mg daily beginning 12/26/2022.  The platelet count is normal today.  The plan is to proceed with a  slow prednisone taper.  She will change the prednisone dose to 30 mg daily and return for a CBC in 3 weeks.  She will be scheduled for an office visit in 6 weeks.  The ITP appears idiopathic.  There is no clinical history to suggest an underlying hematopoietic malignancy or collagen vascular disease.  We discussed salvage treatment options including splenectomy and thrombopoietin therapy.  She will call for spontaneous bleeding or bruising.  Betsy Coder, MD  01/16/2023  1:27 PM

## 2023-01-19 ENCOUNTER — Other Ambulatory Visit: Payer: Self-pay | Admitting: Oncology

## 2023-01-23 ENCOUNTER — Other Ambulatory Visit: Payer: BC Managed Care – PPO

## 2023-01-23 ENCOUNTER — Ambulatory Visit: Payer: BC Managed Care – PPO | Admitting: Oncology

## 2023-02-06 ENCOUNTER — Inpatient Hospital Stay: Payer: BC Managed Care – PPO

## 2023-02-06 DIAGNOSIS — D696 Thrombocytopenia, unspecified: Secondary | ICD-10-CM

## 2023-02-06 DIAGNOSIS — D693 Immune thrombocytopenic purpura: Secondary | ICD-10-CM | POA: Diagnosis not present

## 2023-02-06 LAB — CBC WITH DIFFERENTIAL (CANCER CENTER ONLY)
Abs Immature Granulocytes: 0.04 10*3/uL (ref 0.00–0.07)
Basophils Absolute: 0 10*3/uL (ref 0.0–0.1)
Basophils Relative: 0 %
Eosinophils Absolute: 0.1 10*3/uL (ref 0.0–0.5)
Eosinophils Relative: 1 %
HCT: 36.5 % (ref 36.0–46.0)
Hemoglobin: 12.5 g/dL (ref 12.0–15.0)
Immature Granulocytes: 0 %
Lymphocytes Relative: 28 %
Lymphs Abs: 3.6 10*3/uL (ref 0.7–4.0)
MCH: 27.7 pg (ref 26.0–34.0)
MCHC: 34.2 g/dL (ref 30.0–36.0)
MCV: 80.9 fL (ref 80.0–100.0)
Monocytes Absolute: 0.8 10*3/uL (ref 0.1–1.0)
Monocytes Relative: 7 %
Neutro Abs: 8.2 10*3/uL — ABNORMAL HIGH (ref 1.7–7.7)
Neutrophils Relative %: 64 %
Platelet Count: 102 10*3/uL — ABNORMAL LOW (ref 150–400)
RBC: 4.51 MIL/uL (ref 3.87–5.11)
RDW: 12.4 % (ref 11.5–15.5)
WBC Count: 12.8 10*3/uL — ABNORMAL HIGH (ref 4.0–10.5)
nRBC: 0 % (ref 0.0–0.2)

## 2023-02-09 ENCOUNTER — Encounter: Payer: Self-pay | Admitting: Oncology

## 2023-02-17 ENCOUNTER — Other Ambulatory Visit: Payer: Self-pay | Admitting: *Deleted

## 2023-02-17 DIAGNOSIS — D696 Thrombocytopenia, unspecified: Secondary | ICD-10-CM

## 2023-02-20 ENCOUNTER — Inpatient Hospital Stay: Payer: BC Managed Care – PPO | Attending: Oncology

## 2023-02-20 DIAGNOSIS — D500 Iron deficiency anemia secondary to blood loss (chronic): Secondary | ICD-10-CM | POA: Insufficient documentation

## 2023-02-20 DIAGNOSIS — N920 Excessive and frequent menstruation with regular cycle: Secondary | ICD-10-CM | POA: Diagnosis not present

## 2023-02-20 DIAGNOSIS — Z79899 Other long term (current) drug therapy: Secondary | ICD-10-CM | POA: Diagnosis not present

## 2023-02-20 DIAGNOSIS — D696 Thrombocytopenia, unspecified: Secondary | ICD-10-CM

## 2023-02-20 DIAGNOSIS — D693 Immune thrombocytopenic purpura: Secondary | ICD-10-CM | POA: Diagnosis present

## 2023-02-20 LAB — CBC WITH DIFFERENTIAL (CANCER CENTER ONLY)
Abs Immature Granulocytes: 0.03 10*3/uL (ref 0.00–0.07)
Basophils Absolute: 0 10*3/uL (ref 0.0–0.1)
Basophils Relative: 0 %
Eosinophils Absolute: 0.1 10*3/uL (ref 0.0–0.5)
Eosinophils Relative: 1 %
HCT: 36.1 % (ref 36.0–46.0)
Hemoglobin: 12 g/dL (ref 12.0–15.0)
Immature Granulocytes: 0 %
Lymphocytes Relative: 32 %
Lymphs Abs: 3.4 10*3/uL (ref 0.7–4.0)
MCH: 27.1 pg (ref 26.0–34.0)
MCHC: 33.2 g/dL (ref 30.0–36.0)
MCV: 81.5 fL (ref 80.0–100.0)
Monocytes Absolute: 0.6 10*3/uL (ref 0.1–1.0)
Monocytes Relative: 5 %
Neutro Abs: 6.7 10*3/uL (ref 1.7–7.7)
Neutrophils Relative %: 62 %
Platelet Count: 88 10*3/uL — ABNORMAL LOW (ref 150–400)
RBC: 4.43 MIL/uL (ref 3.87–5.11)
RDW: 12.6 % (ref 11.5–15.5)
WBC Count: 10.8 10*3/uL — ABNORMAL HIGH (ref 4.0–10.5)
nRBC: 0 % (ref 0.0–0.2)

## 2023-02-23 ENCOUNTER — Telehealth: Payer: Self-pay

## 2023-02-23 NOTE — Telephone Encounter (Signed)
Call placed to pt and following message given per MD Sherrill "platelet count remains mildly decreased, decrease prednisone to 20 mg daily, follow-up as scheduled".   Pt verbalizes understanding and agrees with plan of care.

## 2023-02-23 NOTE — Telephone Encounter (Signed)
-----   Message from Gary B Sherrill, MD sent at 02/20/2023  4:04 PM EST ----- Please call patient, platelet count remains mildly decreased, decrease prednisone to 20 mg daily, follow-up as scheduled   

## 2023-02-23 NOTE — Telephone Encounter (Signed)
-----   Message from Ladell Pier, MD sent at 02/20/2023  4:04 PM EST ----- Please call patient, platelet count remains mildly decreased, decrease prednisone to 20 mg daily, follow-up as scheduled

## 2023-02-27 ENCOUNTER — Inpatient Hospital Stay: Payer: BC Managed Care – PPO

## 2023-02-27 ENCOUNTER — Inpatient Hospital Stay (HOSPITAL_BASED_OUTPATIENT_CLINIC_OR_DEPARTMENT_OTHER): Payer: BC Managed Care – PPO | Admitting: Oncology

## 2023-02-27 VITALS — BP 127/80 | HR 100 | Temp 98.1°F | Resp 18 | Ht 65.0 in | Wt 260.0 lb

## 2023-02-27 DIAGNOSIS — D696 Thrombocytopenia, unspecified: Secondary | ICD-10-CM

## 2023-02-27 DIAGNOSIS — D693 Immune thrombocytopenic purpura: Secondary | ICD-10-CM | POA: Diagnosis not present

## 2023-02-27 LAB — CBC WITH DIFFERENTIAL (CANCER CENTER ONLY)
Abs Immature Granulocytes: 0.02 10*3/uL (ref 0.00–0.07)
Basophils Absolute: 0 10*3/uL (ref 0.0–0.1)
Basophils Relative: 0 %
Eosinophils Absolute: 0.1 10*3/uL (ref 0.0–0.5)
Eosinophils Relative: 1 %
HCT: 37.7 % (ref 36.0–46.0)
Hemoglobin: 12.5 g/dL (ref 12.0–15.0)
Immature Granulocytes: 0 %
Lymphocytes Relative: 23 %
Lymphs Abs: 1.7 10*3/uL (ref 0.7–4.0)
MCH: 26.8 pg (ref 26.0–34.0)
MCHC: 33.2 g/dL (ref 30.0–36.0)
MCV: 80.7 fL (ref 80.0–100.0)
Monocytes Absolute: 0.7 10*3/uL (ref 0.1–1.0)
Monocytes Relative: 9 %
Neutro Abs: 5 10*3/uL (ref 1.7–7.7)
Neutrophils Relative %: 67 %
Platelet Count: 36 10*3/uL — ABNORMAL LOW (ref 150–400)
RBC: 4.67 MIL/uL (ref 3.87–5.11)
RDW: 12.9 % (ref 11.5–15.5)
WBC Count: 7.5 10*3/uL (ref 4.0–10.5)
nRBC: 0 % (ref 0.0–0.2)

## 2023-02-27 NOTE — Progress Notes (Signed)
  [facility] OFFICE PROGRESS NOTE   Diagnosis: ITP  INTERVAL HISTORY:   [name] returns as scheduled.  No bleeding.  The menstrual cycle is not heavy.  She is now taking prednisone at a dose of 20 mg daily.  Objective:  Vital signs in last 24 hours:  Blood pressure 127/80, pulse 100, temperature 98.1 F (36.7 C), temperature source Oral, resp. rate 18, height 5\' 5"  (1.651 m), weight 260 lb (117.9 kg), SpO2 98 %.    HEENT: Mild white exudate over the tongue and buccal mucosa Resp: Lungs clear bilaterally Cardio: Regular rate and rhythm GI: No hepatosplenomegaly, nontender Vascular: No leg edema  Skin: No petechiae or ecchymoses   Lab Results:  Lab Results  Component Value Date   WBC 7.5 02/27/2023   HGB 12.5 02/27/2023   HCT 37.7 02/27/2023   MCV 80.7 02/27/2023   PLT 36 (L) 02/27/2023   NEUTROABS 5.0 02/27/2023    CMP  Lab Results  Component Value Date   NA 138 09/19/2022   K 3.8 09/19/2022   CL 104 09/19/2022   CO2 24 09/19/2022   GLUCOSE 138 (H) 09/19/2022   BUN 16 09/19/2022   CREATININE 1.18 (H) 09/19/2022   CALCIUM 8.8 (L) 09/19/2022   PROT 6.9 09/19/2022   ALBUMIN 3.9 09/19/2022   AST 10 (L) 09/19/2022   ALT 12 09/19/2022   ALKPHOS 68 09/19/2022   BILITOT 0.4 09/19/2022   GFRNONAA >60 09/19/2022   GFRAA 101 05/25/2020    Medications: I have reviewed the patient's current medications.   Assessment/Plan:  ITP presenting with platelet count 19,000 07/25/2022 Solu-Medrol followed by prednisone Relapse September 2023 with prednisone taper to 10 mg Rituximab 09/19/2022-4 weekly doses Prednisone taper following rituximab Prednisone taper to 5 mg daily 11/04/2022 Recurrent thrombocytopenia 12/19/2022, prednisone increased to 40 mg daily 12/26/2022 Prednisone decreased to 30 mg daily 01/16/2023 Prednisone decreased to 20 mg daily 02/23/2023  2.  Anemia secondary to menorrhagia and iron deficiency-improved   Disposition: Daisy Meyers  has ITP.  The platelet count remains low while on a prednisone taper.  We discussed treatment options if the platelet count continues to fall.  We discussed splenectomy and thrombopoietin therapy.  We reviewed potential complications associated with splenectomy including the increased risk of infection.  We discussed toxicities associated with thrombopoietin therapy.  She was given reading materials on Promacta.  We decided to continue prednisone at a dose of 20 mg daily.  She will return for a weekly CBC.  She will call for spontaneous bleeding or bruising.  She will be scheduled for an office visit in 3 weeks.  The plan is to begin a trial of Promacta if the platelet count falls.  Betsy Coder, MD  02/27/2023  10:51 AM

## 2023-02-27 NOTE — Progress Notes (Signed)
Provided patient reading sheet on Promacta and gave patient website information.

## 2023-03-06 ENCOUNTER — Inpatient Hospital Stay: Payer: BC Managed Care – PPO

## 2023-03-06 ENCOUNTER — Telehealth: Payer: Self-pay

## 2023-03-06 DIAGNOSIS — D693 Immune thrombocytopenic purpura: Secondary | ICD-10-CM | POA: Diagnosis not present

## 2023-03-06 DIAGNOSIS — D696 Thrombocytopenia, unspecified: Secondary | ICD-10-CM

## 2023-03-06 LAB — CBC WITH DIFFERENTIAL (CANCER CENTER ONLY)
Abs Immature Granulocytes: 0.02 10*3/uL (ref 0.00–0.07)
Basophils Absolute: 0 10*3/uL (ref 0.0–0.1)
Basophils Relative: 0 %
Eosinophils Absolute: 0.1 10*3/uL (ref 0.0–0.5)
Eosinophils Relative: 1 %
HCT: 39.5 % (ref 36.0–46.0)
Hemoglobin: 13.3 g/dL (ref 12.0–15.0)
Immature Granulocytes: 0 %
Lymphocytes Relative: 32 %
Lymphs Abs: 3.3 10*3/uL (ref 0.7–4.0)
MCH: 26.9 pg (ref 26.0–34.0)
MCHC: 33.7 g/dL (ref 30.0–36.0)
MCV: 80 fL (ref 80.0–100.0)
Monocytes Absolute: 0.7 10*3/uL (ref 0.1–1.0)
Monocytes Relative: 7 %
Neutro Abs: 6.4 10*3/uL (ref 1.7–7.7)
Neutrophils Relative %: 60 %
Platelet Count: 118 10*3/uL — ABNORMAL LOW (ref 150–400)
RBC: 4.94 MIL/uL (ref 3.87–5.11)
RDW: 12.8 % (ref 11.5–15.5)
WBC Count: 10.5 10*3/uL (ref 4.0–10.5)
nRBC: 0 % (ref 0.0–0.2)

## 2023-03-06 NOTE — Telephone Encounter (Signed)
Patient gave verbal understanding and had no further questions or concerns  

## 2023-03-06 NOTE — Telephone Encounter (Signed)
-----   Message from Ladell Pier, MD sent at 03/06/2023  3:13 PM EDT ----- Please call patient, the platelet count is better, continue prednisone at the current dose, we will taper the prednisone dose if the platelet count remains improved when she returns in 2 weeks

## 2023-03-13 ENCOUNTER — Inpatient Hospital Stay: Payer: BC Managed Care – PPO

## 2023-03-13 DIAGNOSIS — D693 Immune thrombocytopenic purpura: Secondary | ICD-10-CM | POA: Diagnosis not present

## 2023-03-13 DIAGNOSIS — D696 Thrombocytopenia, unspecified: Secondary | ICD-10-CM

## 2023-03-13 LAB — CBC WITH DIFFERENTIAL (CANCER CENTER ONLY)
Abs Immature Granulocytes: 0.05 10*3/uL (ref 0.00–0.07)
Basophils Absolute: 0 10*3/uL (ref 0.0–0.1)
Basophils Relative: 0 %
Eosinophils Absolute: 0 10*3/uL (ref 0.0–0.5)
Eosinophils Relative: 0 %
HCT: 38.9 % (ref 36.0–46.0)
Hemoglobin: 13 g/dL (ref 12.0–15.0)
Immature Granulocytes: 0 %
Lymphocytes Relative: 8 %
Lymphs Abs: 1.4 10*3/uL (ref 0.7–4.0)
MCH: 26.9 pg (ref 26.0–34.0)
MCHC: 33.4 g/dL (ref 30.0–36.0)
MCV: 80.4 fL (ref 80.0–100.0)
Monocytes Absolute: 0.5 10*3/uL (ref 0.1–1.0)
Monocytes Relative: 3 %
Neutro Abs: 15.9 10*3/uL — ABNORMAL HIGH (ref 1.7–7.7)
Neutrophils Relative %: 89 %
Platelet Count: 148 10*3/uL — ABNORMAL LOW (ref 150–400)
RBC: 4.84 MIL/uL (ref 3.87–5.11)
RDW: 12.7 % (ref 11.5–15.5)
WBC Count: 17.8 10*3/uL — ABNORMAL HIGH (ref 4.0–10.5)
nRBC: 0 % (ref 0.0–0.2)

## 2023-03-20 ENCOUNTER — Inpatient Hospital Stay: Payer: BC Managed Care – PPO | Attending: Oncology | Admitting: Nurse Practitioner

## 2023-03-20 ENCOUNTER — Inpatient Hospital Stay: Payer: BC Managed Care – PPO

## 2023-03-20 ENCOUNTER — Encounter: Payer: Self-pay | Admitting: Nurse Practitioner

## 2023-03-20 VITALS — BP 130/80 | HR 88 | Temp 97.8°F | Resp 18 | Ht 65.0 in | Wt 263.4 lb

## 2023-03-20 DIAGNOSIS — D693 Immune thrombocytopenic purpura: Secondary | ICD-10-CM | POA: Insufficient documentation

## 2023-03-20 DIAGNOSIS — N920 Excessive and frequent menstruation with regular cycle: Secondary | ICD-10-CM | POA: Diagnosis not present

## 2023-03-20 DIAGNOSIS — D500 Iron deficiency anemia secondary to blood loss (chronic): Secondary | ICD-10-CM | POA: Insufficient documentation

## 2023-03-20 DIAGNOSIS — Z79899 Other long term (current) drug therapy: Secondary | ICD-10-CM | POA: Diagnosis not present

## 2023-03-20 DIAGNOSIS — Z7952 Long term (current) use of systemic steroids: Secondary | ICD-10-CM | POA: Insufficient documentation

## 2023-03-20 DIAGNOSIS — D696 Thrombocytopenia, unspecified: Secondary | ICD-10-CM

## 2023-03-20 LAB — CBC WITH DIFFERENTIAL (CANCER CENTER ONLY)
Abs Immature Granulocytes: 0.04 10*3/uL (ref 0.00–0.07)
Basophils Absolute: 0 10*3/uL (ref 0.0–0.1)
Basophils Relative: 0 %
Eosinophils Absolute: 0.1 10*3/uL (ref 0.0–0.5)
Eosinophils Relative: 1 %
HCT: 41.4 % (ref 36.0–46.0)
Hemoglobin: 13.4 g/dL (ref 12.0–15.0)
Immature Granulocytes: 0 %
Lymphocytes Relative: 29 %
Lymphs Abs: 3.5 10*3/uL (ref 0.7–4.0)
MCH: 26.6 pg (ref 26.0–34.0)
MCHC: 32.4 g/dL (ref 30.0–36.0)
MCV: 82.1 fL (ref 80.0–100.0)
Monocytes Absolute: 0.7 10*3/uL (ref 0.1–1.0)
Monocytes Relative: 6 %
Neutro Abs: 7.9 10*3/uL — ABNORMAL HIGH (ref 1.7–7.7)
Neutrophils Relative %: 64 %
Platelet Count: 96 10*3/uL — ABNORMAL LOW (ref 150–400)
RBC: 5.04 MIL/uL (ref 3.87–5.11)
RDW: 13.3 % (ref 11.5–15.5)
WBC Count: 12.3 10*3/uL — ABNORMAL HIGH (ref 4.0–10.5)
nRBC: 0 % (ref 0.0–0.2)

## 2023-03-20 NOTE — Progress Notes (Signed)
  [facility] OFFICE PROGRESS NOTE   Diagnosis: ITP  INTERVAL HISTORY:   Daisy Meyers returns as scheduled.  She continues prednisone 20 mg daily.  No bruising or bleeding.  Overall feels she is tolerating the steroid well.  Objective:  Vital signs in last 24 hours:  Blood pressure 130/80, pulse 88, temperature 97.8 F (36.6 C), temperature source Oral, resp. rate 18, height 5\' 5"  (1.651 m), weight 263 lb 6.4 oz (119.5 kg), SpO2 99 %.    HEENT: White coating over tongue.  No buccal thrush. Resp: Lungs clear bilaterally. Cardio: Regular rate and rhythm. GI: No hepatosplenomegaly. Vascular: No leg edema. Skin: No petechiae or ecchymoses.   Lab Results:  Lab Results  Component Value Date   WBC 12.3 (H) 03/20/2023   HGB 13.4 03/20/2023   HCT 41.4 03/20/2023   MCV 82.1 03/20/2023   PLT 96 (L) 03/20/2023   NEUTROABS 7.9 (H) 03/20/2023    Imaging:  No results found.  Medications: I have reviewed the patient's current medications.  Assessment/Plan: ITP presenting with platelet count 19,000 07/25/2022 Solu-Medrol followed by prednisone Relapse September 2023 with prednisone taper to 10 mg Rituximab 09/19/2022-4 weekly doses Prednisone taper following rituximab Prednisone taper to 5 mg daily 11/04/2022 Recurrent thrombocytopenia 12/19/2022, prednisone increased to 40 mg daily 12/26/2022 Prednisone decreased to 30 mg daily 01/16/2023 Prednisone decreased to 20 mg daily 02/23/2023 Prednisone decreased to 15 mg daily 03/20/2023   2.  Anemia secondary to menorrhagia and iron deficiency-improved    Disposition: Daisy Meyers has ITP.  Current prednisone dose is 20 mg daily.  Platelets lower than 3 weeks ago but at an adequate level.  She will decrease prednisone to 15 mg daily.  Repeat CBC in 3 weeks.  We will see her in follow-up in approximately 6 weeks.  She will contact the office in the interim with any problems.    Lonna Cobb ANP/GNP-BC   03/20/2023  2:16  PM

## 2023-04-10 ENCOUNTER — Inpatient Hospital Stay: Payer: BC Managed Care – PPO

## 2023-04-10 DIAGNOSIS — D693 Immune thrombocytopenic purpura: Secondary | ICD-10-CM | POA: Diagnosis not present

## 2023-04-10 DIAGNOSIS — D696 Thrombocytopenia, unspecified: Secondary | ICD-10-CM

## 2023-04-10 LAB — CBC WITH DIFFERENTIAL (CANCER CENTER ONLY)
Abs Immature Granulocytes: 0.03 10*3/uL (ref 0.00–0.07)
Basophils Absolute: 0 10*3/uL (ref 0.0–0.1)
Basophils Relative: 0 %
Eosinophils Absolute: 0.1 10*3/uL (ref 0.0–0.5)
Eosinophils Relative: 1 %
HCT: 37.3 % (ref 36.0–46.0)
Hemoglobin: 12.1 g/dL (ref 12.0–15.0)
Immature Granulocytes: 0 %
Lymphocytes Relative: 30 %
Lymphs Abs: 3.4 10*3/uL (ref 0.7–4.0)
MCH: 26.8 pg (ref 26.0–34.0)
MCHC: 32.4 g/dL (ref 30.0–36.0)
MCV: 82.7 fL (ref 80.0–100.0)
Monocytes Absolute: 0.8 10*3/uL (ref 0.1–1.0)
Monocytes Relative: 7 %
Neutro Abs: 6.9 10*3/uL (ref 1.7–7.7)
Neutrophils Relative %: 62 %
Platelet Count: 60 10*3/uL — ABNORMAL LOW (ref 150–400)
RBC: 4.51 MIL/uL (ref 3.87–5.11)
RDW: 13.3 % (ref 11.5–15.5)
WBC Count: 11.2 10*3/uL — ABNORMAL HIGH (ref 4.0–10.5)
nRBC: 0 % (ref 0.0–0.2)

## 2023-04-13 ENCOUNTER — Encounter: Payer: Self-pay | Admitting: Nurse Practitioner

## 2023-04-14 ENCOUNTER — Other Ambulatory Visit: Payer: Self-pay

## 2023-04-14 ENCOUNTER — Telehealth: Payer: Self-pay

## 2023-04-14 DIAGNOSIS — D696 Thrombocytopenia, unspecified: Secondary | ICD-10-CM

## 2023-04-14 NOTE — Telephone Encounter (Signed)
-----   Message from Rana Snare, NP sent at 04/10/2023  4:52 PM EDT ----- Please let her know platelets are a little lower.  Continue current dose of prednisone.  Follow-up as scheduled.

## 2023-04-20 ENCOUNTER — Encounter: Payer: Self-pay | Admitting: Nurse Practitioner

## 2023-04-24 ENCOUNTER — Inpatient Hospital Stay: Payer: BC Managed Care – PPO | Attending: Oncology

## 2023-04-24 DIAGNOSIS — Z7952 Long term (current) use of systemic steroids: Secondary | ICD-10-CM | POA: Diagnosis not present

## 2023-04-24 DIAGNOSIS — D696 Thrombocytopenia, unspecified: Secondary | ICD-10-CM

## 2023-04-24 DIAGNOSIS — Z79899 Other long term (current) drug therapy: Secondary | ICD-10-CM | POA: Diagnosis not present

## 2023-04-24 DIAGNOSIS — R58 Hemorrhage, not elsewhere classified: Secondary | ICD-10-CM | POA: Insufficient documentation

## 2023-04-24 DIAGNOSIS — D500 Iron deficiency anemia secondary to blood loss (chronic): Secondary | ICD-10-CM | POA: Diagnosis not present

## 2023-04-24 DIAGNOSIS — D693 Immune thrombocytopenic purpura: Secondary | ICD-10-CM | POA: Diagnosis not present

## 2023-04-24 DIAGNOSIS — M7989 Other specified soft tissue disorders: Secondary | ICD-10-CM | POA: Diagnosis not present

## 2023-04-24 DIAGNOSIS — R600 Localized edema: Secondary | ICD-10-CM | POA: Diagnosis not present

## 2023-04-24 DIAGNOSIS — N920 Excessive and frequent menstruation with regular cycle: Secondary | ICD-10-CM | POA: Diagnosis not present

## 2023-04-24 LAB — CBC WITH DIFFERENTIAL (CANCER CENTER ONLY)
Abs Immature Granulocytes: 0.02 10*3/uL (ref 0.00–0.07)
Basophils Absolute: 0 10*3/uL (ref 0.0–0.1)
Basophils Relative: 0 %
Eosinophils Absolute: 0.1 10*3/uL (ref 0.0–0.5)
Eosinophils Relative: 1 %
HCT: 36.6 % (ref 36.0–46.0)
Hemoglobin: 12.3 g/dL (ref 12.0–15.0)
Immature Granulocytes: 0 %
Lymphocytes Relative: 24 %
Lymphs Abs: 2.7 10*3/uL (ref 0.7–4.0)
MCH: 27.7 pg (ref 26.0–34.0)
MCHC: 33.6 g/dL (ref 30.0–36.0)
MCV: 82.4 fL (ref 80.0–100.0)
Monocytes Absolute: 0.6 10*3/uL (ref 0.1–1.0)
Monocytes Relative: 6 %
Neutro Abs: 7.7 10*3/uL (ref 1.7–7.7)
Neutrophils Relative %: 69 %
Platelet Count: 65 10*3/uL — ABNORMAL LOW (ref 150–400)
RBC: 4.44 MIL/uL (ref 3.87–5.11)
RDW: 13.8 % (ref 11.5–15.5)
WBC Count: 11.2 10*3/uL — ABNORMAL HIGH (ref 4.0–10.5)
nRBC: 0 % (ref 0.0–0.2)

## 2023-04-26 DIAGNOSIS — M7989 Other specified soft tissue disorders: Secondary | ICD-10-CM | POA: Diagnosis not present

## 2023-04-26 DIAGNOSIS — Z7952 Long term (current) use of systemic steroids: Secondary | ICD-10-CM | POA: Diagnosis not present

## 2023-04-26 DIAGNOSIS — Z79899 Other long term (current) drug therapy: Secondary | ICD-10-CM | POA: Diagnosis not present

## 2023-04-26 DIAGNOSIS — R600 Localized edema: Secondary | ICD-10-CM | POA: Diagnosis not present

## 2023-04-26 DIAGNOSIS — D500 Iron deficiency anemia secondary to blood loss (chronic): Secondary | ICD-10-CM | POA: Diagnosis not present

## 2023-04-26 DIAGNOSIS — R58 Hemorrhage, not elsewhere classified: Secondary | ICD-10-CM | POA: Diagnosis not present

## 2023-04-26 DIAGNOSIS — D693 Immune thrombocytopenic purpura: Secondary | ICD-10-CM | POA: Diagnosis present

## 2023-04-26 DIAGNOSIS — N920 Excessive and frequent menstruation with regular cycle: Secondary | ICD-10-CM | POA: Diagnosis not present

## 2023-04-27 ENCOUNTER — Telehealth: Payer: Self-pay

## 2023-04-27 NOTE — Telephone Encounter (Signed)
Patient gave verbal understanding and no further questions or concerns 

## 2023-04-27 NOTE — Telephone Encounter (Signed)
-----   Message from Rana Snare, NP sent at 04/27/2023  8:11 AM EDT ----- Please let her know the platelet count is stable.  Taper prednisone to 10 mg daily.  Follow-up as scheduled.

## 2023-05-08 ENCOUNTER — Inpatient Hospital Stay: Payer: BC Managed Care – PPO

## 2023-05-08 ENCOUNTER — Inpatient Hospital Stay: Payer: BC Managed Care – PPO | Admitting: Nurse Practitioner

## 2023-05-15 ENCOUNTER — Telehealth: Payer: Self-pay

## 2023-05-15 ENCOUNTER — Encounter: Payer: Self-pay | Admitting: Nurse Practitioner

## 2023-05-15 ENCOUNTER — Telehealth: Payer: Self-pay | Admitting: Pharmacist

## 2023-05-15 ENCOUNTER — Encounter: Payer: Self-pay | Admitting: Oncology

## 2023-05-15 ENCOUNTER — Inpatient Hospital Stay: Payer: BC Managed Care – PPO

## 2023-05-15 ENCOUNTER — Other Ambulatory Visit: Payer: Self-pay

## 2023-05-15 ENCOUNTER — Other Ambulatory Visit (HOSPITAL_COMMUNITY): Payer: Self-pay

## 2023-05-15 ENCOUNTER — Inpatient Hospital Stay (HOSPITAL_BASED_OUTPATIENT_CLINIC_OR_DEPARTMENT_OTHER): Payer: BC Managed Care – PPO | Admitting: Nurse Practitioner

## 2023-05-15 VITALS — BP 126/85 | HR 90 | Temp 98.2°F | Resp 16 | Wt 266.6 lb

## 2023-05-15 DIAGNOSIS — D696 Thrombocytopenia, unspecified: Secondary | ICD-10-CM

## 2023-05-15 DIAGNOSIS — D693 Immune thrombocytopenic purpura: Secondary | ICD-10-CM | POA: Diagnosis not present

## 2023-05-15 LAB — CBC WITH DIFFERENTIAL (CANCER CENTER ONLY)
Abs Immature Granulocytes: 0.03 10*3/uL (ref 0.00–0.07)
Basophils Absolute: 0 10*3/uL (ref 0.0–0.1)
Basophils Relative: 0 %
Eosinophils Absolute: 0 10*3/uL (ref 0.0–0.5)
Eosinophils Relative: 0 %
HCT: 35.9 % — ABNORMAL LOW (ref 36.0–46.0)
Hemoglobin: 11.9 g/dL — ABNORMAL LOW (ref 12.0–15.0)
Immature Granulocytes: 0 %
Lymphocytes Relative: 11 %
Lymphs Abs: 1.1 10*3/uL (ref 0.7–4.0)
MCH: 27 pg (ref 26.0–34.0)
MCHC: 33.1 g/dL (ref 30.0–36.0)
MCV: 81.4 fL (ref 80.0–100.0)
Monocytes Absolute: 0.3 10*3/uL (ref 0.1–1.0)
Monocytes Relative: 3 %
Neutro Abs: 8.2 10*3/uL — ABNORMAL HIGH (ref 1.7–7.7)
Neutrophils Relative %: 86 %
Platelet Count: 23 10*3/uL — ABNORMAL LOW (ref 150–400)
RBC: 4.41 MIL/uL (ref 3.87–5.11)
RDW: 13.2 % (ref 11.5–15.5)
WBC Count: 9.7 10*3/uL (ref 4.0–10.5)
nRBC: 0 % (ref 0.0–0.2)

## 2023-05-15 MED ORDER — ELTROMBOPAG OLAMINE 50 MG PO TABS
50.0000 mg | ORAL_TABLET | Freq: Every day | ORAL | 4 refills | Status: DC
Start: 2023-05-15 — End: 2023-06-26
  Filled 2023-05-15 – 2023-05-18 (×2): qty 30, 30d supply, fill #0
  Filled 2023-06-08: qty 30, 30d supply, fill #1

## 2023-05-15 NOTE — Telephone Encounter (Signed)
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  No history of hepatic impairment found on chart review, patient will need CMP monitored while on treatment. Prescription dose and frequency assessed.   Current medication list in Epic reviewed, one DDIs with eltrombopag identified: Ferrous sulfate: Ferrous sulfate may decrease the serum concentration of Eltrombopag. Recommend to administer eltrombopag at least 2 hours before or 4 hours after ferrous sulfate.   Evaluated chart and no patient barriers to medication adherence identified.   Prescription has been e-scribed to the [facility] for benefits analysis and approval.  Oral Oncology Clinic will continue to follow for insurance authorization, copayment issues, initial counseling and start date.   Remi Haggard, PharmD, BCPS, BCOP, CPP Hematology/Oncology Clinical Pharmacist Practitioner [place]/DB/AP Oral Chemotherapy Navigation Clinic [phone]  05/15/2023 1:59 PM

## 2023-05-15 NOTE — Telephone Encounter (Signed)
Received Clinical Questions for Prior Authorization via fax from [facility] [place]. They are apparently having technical difficulties communicating with CoverMyMeds. Clinical Questions has been filled out and sent to MD Office for signatures. I will submit to [facility] once returned. I will continue to follow and update until final determination.    Ardeen Fillers, CPhT Oncology Pharmacy Patient Advocate  [facility]  [phone] (phone) [phone] (fax) 05/15/2023 4:09 PM

## 2023-05-15 NOTE — Telephone Encounter (Signed)
Oral Oncology Patient Advocate Encounter  New authorization   Received notification that prior authorization for Promacta is required.   PA submitted on 05/15/23  Key [id number]  Status is pending     Ardeen Fillers, CPhT Oncology Pharmacy Patient Advocate  [facility]  [phone] (phone) [phone] (fax) 05/15/2023 2:02 PM

## 2023-05-15 NOTE — Progress Notes (Signed)
  [facility] OFFICE PROGRESS NOTE   Diagnosis: ITP  INTERVAL HISTORY:   [name] returns as scheduled.  Prednisone was tapered to 10 mg daily beginning 04/27/2023.  She denies bleeding.  She continues to note leg swelling.  Objective:  Vital signs in last 24 hours:  Blood pressure 126/85, pulse 90, temperature 98.2 F (36.8 C), temperature source Oral, resp. rate 16, weight 266 lb 9.6 oz (120.9 kg), SpO2 100 %.    HEENT: Mild white coating over tongue.  No buccal thrush.  No bleeding in the oral cavity. Resp: Lungs clear bilaterally. Cardio: Regular rate and rhythm. GI: Abdomen soft and nontender.  No hepatosplenomegaly. Vascular: Trace lower leg edema bilaterally. Neuro: Alert and oriented. Skin: Small resolving ecchymosis left knee.  No petechiae.   Lab Results:  Lab Results  Component Value Date   WBC 9.7 05/15/2023   HGB 11.9 (L) 05/15/2023   HCT 35.9 (L) 05/15/2023   MCV 81.4 05/15/2023   PLT 23 (L) 05/15/2023   NEUTROABS 8.2 (H) 05/15/2023    Imaging:  No results found.  Medications: I have reviewed the patient's current medications.  Assessment/Plan: ITP presenting with platelet count 19,000 07/25/2022 Solu-Medrol followed by prednisone Relapse September 2023 with prednisone taper to 10 mg Rituximab 09/19/2022-4 weekly doses Prednisone taper following rituximab Prednisone taper to 5 mg daily 11/04/2022 Recurrent thrombocytopenia 12/19/2022, prednisone increased to 40 mg daily 12/26/2022 Prednisone decreased to 30 mg daily 01/16/2023 Prednisone decreased to 20 mg daily 02/23/2023 Prednisone decreased to 15 mg daily 03/20/2023 Prednisone decreased to 10 mg daily beginning 04/27/2023   2.  Anemia secondary to menorrhagia and iron deficiency-improved  Disposition: Daisy Meyers has ITP currently on a prolonged steroid taper.  Unfortunately the platelet count is lower following a decrease in the prednisone dose from 15 mg to 10 mg 3 weeks ago.  She will  increase prednisone to 20 mg daily.  She would like to proceed with Promacta.  We reviewed potential side effects including liver toxicity and thromboembolism.  She agrees to proceed.  Prescription sent to the pharmacy for Promacta 50 mg daily.  She will alert [place] with the start date.  She will return for a follow-up CBC early next week.  We will see her in follow-up in 2 weeks.    Lonna Cobb ANP/GNP-BC   05/15/2023  1:54 PM

## 2023-05-15 NOTE — Telephone Encounter (Signed)
Oral Oncology Patient Advocate Encounter   Was successful in obtaining a co-pay card for Promacta.  This co-pay card will make the patients co-pay ~$0.00.  The billing information is as follows and has been shared with [facility].   RxBin: [id number] PCN: [id number] Member ID: [id number] Group ID: [id number]   Ardeen Fillers, CPhT Oncology Pharmacy Patient Advocate  [facility]  [phone] (phone) [phone] (fax) 05/15/2023 3:02 PM

## 2023-05-18 ENCOUNTER — Other Ambulatory Visit: Payer: Self-pay

## 2023-05-18 ENCOUNTER — Other Ambulatory Visit (HOSPITAL_COMMUNITY): Payer: Self-pay

## 2023-05-18 NOTE — Telephone Encounter (Signed)
Oral Oncology Patient Advocate Encounter  Prior Authorization for Val Riles has been approved.    PA# [id number]  Effective dates: 05/15/23 through 07/11/23  Patients co-pay is $100.00.  Patient has co-pay card to bring co-pay to ~$0.00.    Ardeen Fillers, CPhT Oncology Pharmacy Patient Advocate  [facility]  [phone] (phone) [phone] (fax) 05/18/2023 7:52 AM

## 2023-05-18 NOTE — Telephone Encounter (Signed)
Patient successfully OnBoarded and drug education provided by pharmacist. Medication scheduled for pickup on 05/20/23 from [facility]. Patient also knows to call me at [phone] with any questions or concerns regarding receiving medication or if there is any unexpected change in co-pay.    Ardeen Fillers, CPhT Oncology Pharmacy Patient Advocate  [facility]  [phone] (phone) [phone] (fax) 05/18/2023 4:22 PM

## 2023-05-18 NOTE — Telephone Encounter (Signed)
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  Counseled patient on administration, dosing, side effects, monitoring, drug-food interactions, safe handling, storage, and disposal. Patient will take 1 tablet (50 mg total) by mouth daily. Take on an empty stomach 1 hour before a meal or 2 hours after .  Side effects include but not limited to: nausea or diarrhea.    Reviewed with patient importance of keeping a medication schedule and plan for any missed doses.  After discussion with patient no patient barriers to medication adherence identified.   Daisy Meyers voiced understanding and appreciation. All questions answered. Medication handout provided.  Provided patient with Oral Chemotherapy Navigation Clinic phone number. Patient knows to call the office with questions or concerns. Oral Chemotherapy Navigation Clinic will continue to follow.  Daisy Meyers, PharmD, BCPS, BCOP, CPP Hematology/Oncology Clinical Pharmacist Practitioner [place]/DB/AP Oral Chemotherapy Navigation Clinic [phone]  05/18/2023 3:58 PM

## 2023-05-19 ENCOUNTER — Inpatient Hospital Stay: Payer: BC Managed Care – PPO | Attending: Oncology

## 2023-05-19 DIAGNOSIS — D693 Immune thrombocytopenic purpura: Secondary | ICD-10-CM | POA: Insufficient documentation

## 2023-05-19 DIAGNOSIS — D500 Iron deficiency anemia secondary to blood loss (chronic): Secondary | ICD-10-CM | POA: Insufficient documentation

## 2023-05-19 DIAGNOSIS — Z7952 Long term (current) use of systemic steroids: Secondary | ICD-10-CM | POA: Insufficient documentation

## 2023-05-19 DIAGNOSIS — M7989 Other specified soft tissue disorders: Secondary | ICD-10-CM | POA: Insufficient documentation

## 2023-05-19 DIAGNOSIS — D696 Thrombocytopenia, unspecified: Secondary | ICD-10-CM

## 2023-05-19 DIAGNOSIS — N920 Excessive and frequent menstruation with regular cycle: Secondary | ICD-10-CM | POA: Insufficient documentation

## 2023-05-19 DIAGNOSIS — Z79899 Other long term (current) drug therapy: Secondary | ICD-10-CM | POA: Insufficient documentation

## 2023-05-19 LAB — CBC WITH DIFFERENTIAL (CANCER CENTER ONLY)
Abs Immature Granulocytes: 0.02 10*3/uL (ref 0.00–0.07)
Basophils Absolute: 0 10*3/uL (ref 0.0–0.1)
Basophils Relative: 0 %
Eosinophils Absolute: 0.1 10*3/uL (ref 0.0–0.5)
Eosinophils Relative: 1 %
HCT: 35.3 % — ABNORMAL LOW (ref 36.0–46.0)
Hemoglobin: 11.7 g/dL — ABNORMAL LOW (ref 12.0–15.0)
Immature Granulocytes: 0 %
Lymphocytes Relative: 37 %
Lymphs Abs: 3.2 10*3/uL (ref 0.7–4.0)
MCH: 27.2 pg (ref 26.0–34.0)
MCHC: 33.1 g/dL (ref 30.0–36.0)
MCV: 82.1 fL (ref 80.0–100.0)
Monocytes Absolute: 0.6 10*3/uL (ref 0.1–1.0)
Monocytes Relative: 6 %
Neutro Abs: 5 10*3/uL (ref 1.7–7.7)
Neutrophils Relative %: 56 %
Platelet Count: 98 10*3/uL — ABNORMAL LOW (ref 150–400)
RBC: 4.3 MIL/uL (ref 3.87–5.11)
RDW: 13.1 % (ref 11.5–15.5)
WBC Count: 8.8 10*3/uL (ref 4.0–10.5)
nRBC: 0 % (ref 0.0–0.2)

## 2023-05-21 NOTE — Telephone Encounter (Signed)
-----   Message from Rana Snare, NP sent at 05/20/2023  3:55 PM EDT ----- Please let her know platelets are better, taper prednisone to 15 mg daily for 4 days, then decrease to 10 mg daily; f/u as scheduled.

## 2023-05-29 ENCOUNTER — Encounter: Payer: Self-pay | Admitting: Oncology

## 2023-05-29 ENCOUNTER — Encounter: Payer: Self-pay | Admitting: Nurse Practitioner

## 2023-05-29 ENCOUNTER — Inpatient Hospital Stay: Payer: BC Managed Care – PPO | Admitting: Nurse Practitioner

## 2023-05-29 ENCOUNTER — Inpatient Hospital Stay: Payer: BC Managed Care – PPO

## 2023-05-29 VITALS — BP 129/63 | HR 100 | Temp 98.1°F | Resp 18 | Ht 65.0 in | Wt 266.7 lb

## 2023-05-29 DIAGNOSIS — D693 Immune thrombocytopenic purpura: Secondary | ICD-10-CM | POA: Diagnosis not present

## 2023-05-29 DIAGNOSIS — D696 Thrombocytopenia, unspecified: Secondary | ICD-10-CM

## 2023-05-29 LAB — CMP (CANCER CENTER ONLY)
ALT: 11 U/L (ref 0–44)
AST: 12 U/L — ABNORMAL LOW (ref 15–41)
Albumin: 4.4 g/dL (ref 3.5–5.0)
Alkaline Phosphatase: 81 U/L (ref 38–126)
Anion gap: 12 (ref 5–15)
BUN: 14 mg/dL (ref 6–20)
CO2: 22 mmol/L (ref 22–32)
Calcium: 9.2 mg/dL (ref 8.9–10.3)
Chloride: 105 mmol/L (ref 98–111)
Creatinine: 0.81 mg/dL (ref 0.44–1.00)
GFR, Estimated: >60 mL/min (ref >60)
Glucose, Bld: 87 mg/dL (ref 70–99)
Potassium: 3.7 mmol/L (ref 3.5–5.1)
Sodium: 139 mmol/L (ref 135–145)
Total Bilirubin: 0.4 mg/dL (ref 0.3–1.2)
Total Protein: 7.1 g/dL (ref 6.5–8.1)

## 2023-05-29 LAB — CBC WITH DIFFERENTIAL (CANCER CENTER ONLY)
Abs Immature Granulocytes: 0.02 10*3/uL (ref 0.00–0.07)
Basophils Absolute: 0 10*3/uL (ref 0.0–0.1)
Basophils Relative: 0 %
Eosinophils Absolute: 0.1 10*3/uL (ref 0.0–0.5)
Eosinophils Relative: 1 %
HCT: 37.6 % (ref 36.0–46.0)
Hemoglobin: 12.7 g/dL (ref 12.0–15.0)
Immature Granulocytes: 0 %
Lymphocytes Relative: 22 %
Lymphs Abs: 2.3 10*3/uL (ref 0.7–4.0)
MCH: 27.3 pg (ref 26.0–34.0)
MCHC: 33.8 g/dL (ref 30.0–36.0)
MCV: 80.9 fL (ref 80.0–100.0)
Monocytes Absolute: 0.5 10*3/uL (ref 0.1–1.0)
Monocytes Relative: 4 %
Neutro Abs: 7.5 10*3/uL (ref 1.7–7.7)
Neutrophils Relative %: 73 %
Platelet Count: 59 10*3/uL — ABNORMAL LOW (ref 150–400)
RBC: 4.65 MIL/uL (ref 3.87–5.11)
RDW: 12.9 % (ref 11.5–15.5)
WBC Count: 10.3 10*3/uL (ref 4.0–10.5)
nRBC: 0 % (ref 0.0–0.2)

## 2023-05-29 NOTE — Progress Notes (Signed)
  [facility] OFFICE PROGRESS NOTE   Diagnosis: ITP  INTERVAL HISTORY:   Daisy Meyers returns as scheduled.  Platelet count returned at 98,000 on 05/19/2023.  She began [facility] 05/20/2023.  Unfortunately she did not receive our instructions to begin a prednisone taper.  She continues 20 mg daily.  She denies bruising/bleeding.  She continues to note mild leg swelling.  Objective:  Vital signs in last 24 hours:  Blood pressure 129/63, pulse 100, temperature 98.1 F (36.7 C), temperature source Oral, resp. rate 18, height 5\' 5"  (1.651 m), weight 266 lb 11.2 oz (121 kg), SpO2 100 %.    HEENT: No thrush. Resp: Lungs clear bilaterally. Cardio: Regular rate and rhythm. GI: No hepatosplenomegaly. Vascular: Trace lower leg edema bilaterally. Neuro: Alert and oriented. Skin: No ecchymoses or petechiae.   Lab Results:  Lab Results  Component Value Date   WBC 10.3 05/29/2023   HGB 12.7 05/29/2023   HCT 37.6 05/29/2023   MCV 80.9 05/29/2023   PLT 59 (L) 05/29/2023   NEUTROABS 7.5 05/29/2023    Imaging:  No results found.  Medications: I have reviewed the patient's current medications.  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  Anemia secondary to menorrhagia and iron deficiency-improved  Disposition: Daisy Meyers appears  stable.  She began [facility] 05/20/2023.  Platelet count today 60,000.  She will taper prednisone to 15 mg daily for 4 days, then 10 mg daily.  Follow-up CBC in 1 week and 4 weeks.  We will see her in follow-up in 8 weeks.    Daisy Meyers ANP/GNP-BC   05/29/2023  1:27 PM

## 2023-06-01 ENCOUNTER — Other Ambulatory Visit: Payer: Self-pay

## 2023-06-05 ENCOUNTER — Inpatient Hospital Stay: Payer: BC Managed Care – PPO

## 2023-06-05 ENCOUNTER — Encounter: Payer: Self-pay | Admitting: Oncology

## 2023-06-05 DIAGNOSIS — D696 Thrombocytopenia, unspecified: Secondary | ICD-10-CM

## 2023-06-05 DIAGNOSIS — D693 Immune thrombocytopenic purpura: Secondary | ICD-10-CM | POA: Diagnosis not present

## 2023-06-05 LAB — CBC WITH DIFFERENTIAL (CANCER CENTER ONLY)
Abs Immature Granulocytes: 0.05 10*3/uL (ref 0.00–0.07)
Basophils Absolute: 0 10*3/uL (ref 0.0–0.1)
Basophils Relative: 0 %
Eosinophils Absolute: 0 10*3/uL (ref 0.0–0.5)
Eosinophils Relative: 0 %
HCT: 36.1 % (ref 36.0–46.0)
Hemoglobin: 11.9 g/dL — ABNORMAL LOW (ref 12.0–15.0)
Immature Granulocytes: 0 %
Lymphocytes Relative: 11 %
Lymphs Abs: 1.3 10*3/uL (ref 0.7–4.0)
MCH: 26.5 pg (ref 26.0–34.0)
MCHC: 33 g/dL (ref 30.0–36.0)
MCV: 80.4 fL (ref 80.0–100.0)
Monocytes Absolute: 0.3 10*3/uL (ref 0.1–1.0)
Monocytes Relative: 2 %
Neutro Abs: 9.9 10*3/uL — ABNORMAL HIGH (ref 1.7–7.7)
Neutrophils Relative %: 87 %
Platelet Count: 76 10*3/uL — ABNORMAL LOW (ref 150–400)
RBC: 4.49 MIL/uL (ref 3.87–5.11)
RDW: 12.8 % (ref 11.5–15.5)
WBC Count: 11.5 10*3/uL — ABNORMAL HIGH (ref 4.0–10.5)
nRBC: 0 % (ref 0.0–0.2)

## 2023-06-08 ENCOUNTER — Other Ambulatory Visit: Payer: Self-pay

## 2023-06-10 ENCOUNTER — Other Ambulatory Visit: Payer: Self-pay

## 2023-06-10 ENCOUNTER — Other Ambulatory Visit (HOSPITAL_COMMUNITY): Payer: Self-pay

## 2023-06-11 ENCOUNTER — Other Ambulatory Visit: Payer: Self-pay

## 2023-06-11 ENCOUNTER — Other Ambulatory Visit (HOSPITAL_COMMUNITY): Payer: Self-pay

## 2023-06-12 ENCOUNTER — Other Ambulatory Visit: Payer: Self-pay

## 2023-06-12 ENCOUNTER — Other Ambulatory Visit (HOSPITAL_COMMUNITY): Payer: Self-pay

## 2023-06-26 ENCOUNTER — Inpatient Hospital Stay: Payer: BC Managed Care – PPO | Attending: Oncology

## 2023-06-26 ENCOUNTER — Other Ambulatory Visit: Payer: Self-pay | Admitting: Nurse Practitioner

## 2023-06-26 ENCOUNTER — Telehealth: Payer: Self-pay

## 2023-06-26 ENCOUNTER — Other Ambulatory Visit: Payer: Self-pay

## 2023-06-26 DIAGNOSIS — D693 Immune thrombocytopenic purpura: Secondary | ICD-10-CM | POA: Diagnosis present

## 2023-06-26 DIAGNOSIS — D696 Thrombocytopenia, unspecified: Secondary | ICD-10-CM

## 2023-06-26 LAB — CBC WITH DIFFERENTIAL (CANCER CENTER ONLY)
Abs Immature Granulocytes: 0.02 10*3/uL (ref 0.00–0.07)
Basophils Absolute: 0 10*3/uL (ref 0.0–0.1)
Basophils Relative: 0 %
Eosinophils Absolute: 0.1 10*3/uL (ref 0.0–0.5)
Eosinophils Relative: 1 %
HCT: 34.8 % — ABNORMAL LOW (ref 36.0–46.0)
Hemoglobin: 11.5 g/dL — ABNORMAL LOW (ref 12.0–15.0)
Immature Granulocytes: 0 %
Lymphocytes Relative: 21 %
Lymphs Abs: 2 10*3/uL (ref 0.7–4.0)
MCH: 26.5 pg (ref 26.0–34.0)
MCHC: 33 g/dL (ref 30.0–36.0)
MCV: 80.2 fL (ref 80.0–100.0)
Monocytes Absolute: 0.5 10*3/uL (ref 0.1–1.0)
Monocytes Relative: 5 %
Neutro Abs: 7 10*3/uL (ref 1.7–7.7)
Neutrophils Relative %: 73 %
Platelet Count: 39 10*3/uL — ABNORMAL LOW (ref 150–400)
RBC: 4.34 MIL/uL (ref 3.87–5.11)
RDW: 12.2 % (ref 11.5–15.5)
WBC Count: 9.6 10*3/uL (ref 4.0–10.5)
nRBC: 0 % (ref 0.0–0.2)

## 2023-06-26 MED ORDER — ELTROMBOPAG OLAMINE 75 MG PO TABS
75.0000 mg | ORAL_TABLET | Freq: Every day | ORAL | 2 refills | Status: DC
Start: 2023-06-26 — End: 2023-06-29

## 2023-06-26 NOTE — Telephone Encounter (Signed)
-----   Message from Lonna Cobb sent at 06/26/2023  4:14 PM EDT ----- Please let her know the platelet count is lower.  Continue prednisone at the current dose.  Need to increase Promacta to 75 mg daily.  I am sending a new prescription to her pharmacy for Promacta 75 mg daily.  Call with any bleeding.  She will need a repeat CBC in 2 weeks.  Thanks

## 2023-06-26 NOTE — Telephone Encounter (Signed)
Patient gave verbal understanding and had no further questions or concerns  

## 2023-06-27 ENCOUNTER — Encounter: Payer: Self-pay | Admitting: Nurse Practitioner

## 2023-06-29 ENCOUNTER — Other Ambulatory Visit: Payer: Self-pay

## 2023-06-29 ENCOUNTER — Other Ambulatory Visit (HOSPITAL_COMMUNITY): Payer: Self-pay

## 2023-06-29 ENCOUNTER — Other Ambulatory Visit: Payer: Self-pay | Admitting: *Deleted

## 2023-06-29 DIAGNOSIS — D696 Thrombocytopenia, unspecified: Secondary | ICD-10-CM

## 2023-06-29 MED ORDER — ELTROMBOPAG OLAMINE 75 MG PO TABS
75.0000 mg | ORAL_TABLET | Freq: Every day | ORAL | 2 refills | Status: DC
Start: 2023-06-29 — End: 2023-10-08
  Filled 2023-06-29 (×2): qty 30, 30d supply, fill #0
  Filled 2023-07-31: qty 30, 30d supply, fill #1
  Filled 2023-09-09: qty 30, 30d supply, fill #2

## 2023-06-30 ENCOUNTER — Other Ambulatory Visit (HOSPITAL_COMMUNITY): Payer: Self-pay

## 2023-07-01 ENCOUNTER — Telehealth: Payer: Self-pay

## 2023-07-01 ENCOUNTER — Other Ambulatory Visit (HOSPITAL_COMMUNITY): Payer: Self-pay

## 2023-07-01 NOTE — Telephone Encounter (Signed)
Oral Oncology Patient Advocate Encounter  Re-authorization   Received notification that prior authorization for Promacta is required.   PA submitted on 07/01/23  Key [id number]  Status is pending     Ardeen Fillers, CPhT Oncology Pharmacy Patient Advocate  [facility]  [phone] (phone) [phone] (fax) 07/01/2023 8:50 AM

## 2023-07-01 NOTE — Telephone Encounter (Signed)
Oral Oncology Patient Advocate Encounter  Prior Authorization for Daisy Meyers has been approved.    PA# [id number]  Effective dates: 07/01/23 through 06/30/24  Patient may continue to fill at [facility].    Ardeen Fillers, CPhT Oncology Pharmacy Patient Advocate  [facility]  [phone] (phone) [phone] (fax) 07/01/2023 9:57 AM

## 2023-07-02 ENCOUNTER — Other Ambulatory Visit: Payer: Self-pay

## 2023-07-03 ENCOUNTER — Other Ambulatory Visit (HOSPITAL_COMMUNITY): Payer: Self-pay

## 2023-07-06 ENCOUNTER — Encounter: Payer: Self-pay | Admitting: Nurse Practitioner

## 2023-07-08 ENCOUNTER — Encounter: Payer: Self-pay | Admitting: Nurse Practitioner

## 2023-07-09 ENCOUNTER — Encounter: Payer: Self-pay | Admitting: Oncology

## 2023-07-10 ENCOUNTER — Inpatient Hospital Stay: Payer: BC Managed Care – PPO

## 2023-07-10 DIAGNOSIS — D696 Thrombocytopenia, unspecified: Secondary | ICD-10-CM

## 2023-07-10 DIAGNOSIS — D693 Immune thrombocytopenic purpura: Secondary | ICD-10-CM | POA: Diagnosis not present

## 2023-07-10 LAB — CBC WITH DIFFERENTIAL (CANCER CENTER ONLY)
Abs Immature Granulocytes: 0.02 10*3/uL (ref 0.00–0.07)
Basophils Absolute: 0 10*3/uL (ref 0.0–0.1)
Basophils Relative: 0 %
Eosinophils Absolute: 0.1 10*3/uL (ref 0.0–0.5)
Eosinophils Relative: 1 %
HCT: 35.5 % — ABNORMAL LOW (ref 36.0–46.0)
Hemoglobin: 11.7 g/dL — ABNORMAL LOW (ref 12.0–15.0)
Immature Granulocytes: 0 %
Lymphocytes Relative: 21 %
Lymphs Abs: 1.8 10*3/uL (ref 0.7–4.0)
MCH: 26.4 pg (ref 26.0–34.0)
MCHC: 33 g/dL (ref 30.0–36.0)
MCV: 80 fL (ref 80.0–100.0)
Monocytes Absolute: 0.5 10*3/uL (ref 0.1–1.0)
Monocytes Relative: 6 %
Neutro Abs: 6.2 10*3/uL (ref 1.7–7.7)
Neutrophils Relative %: 72 %
Platelet Count: 80 10*3/uL — ABNORMAL LOW (ref 150–400)
RBC: 4.44 MIL/uL (ref 3.87–5.11)
RDW: 12.4 % (ref 11.5–15.5)
WBC Count: 8.6 10*3/uL (ref 4.0–10.5)
nRBC: 0 % (ref 0.0–0.2)

## 2023-07-13 ENCOUNTER — Telehealth: Payer: Self-pay | Admitting: Nurse Practitioner

## 2023-07-13 NOTE — Telephone Encounter (Signed)
-----   Message from Lonna Cobb sent at 07/13/2023  2:45 PM EDT ----- Please let her know the platelet count is higher.  Continue same dose of Promacta.  What is current dose of prednisone?

## 2023-07-13 NOTE — Telephone Encounter (Signed)
VM message left for pt to return call to office.

## 2023-07-14 ENCOUNTER — Telehealth: Payer: Self-pay | Admitting: *Deleted

## 2023-07-14 NOTE — Telephone Encounter (Addendum)
Platelet count improved at 80,000. She called back to report she had been on prednisone 5 mg daily, but has not take in last week due to losing track of the medication in their move 1 week ago. She is taking the Promacta 75 mg daily with no break in therapy. Per Dr. Truett Perna: D/C Prednisone and continue Promacta. F/U as scheduled.

## 2023-07-16 ENCOUNTER — Telehealth: Payer: Self-pay | Admitting: *Deleted

## 2023-07-16 DIAGNOSIS — D696 Thrombocytopenia, unspecified: Secondary | ICD-10-CM

## 2023-07-16 NOTE — Telephone Encounter (Signed)
Per Lonna Cobb, NP: OK to check CBC on 8/2. High priority scheduling message sent.

## 2023-07-16 NOTE — Telephone Encounter (Signed)
Platelet count on 7/26 = 80,000 and is on Promacta daily. She called to report menses has started and flow is very heavy for past 2 days and wants to check her platelet count again if MD agrees.

## 2023-07-17 ENCOUNTER — Inpatient Hospital Stay: Payer: BC Managed Care – PPO

## 2023-07-17 ENCOUNTER — Other Ambulatory Visit: Payer: Self-pay

## 2023-07-17 ENCOUNTER — Encounter: Payer: Self-pay | Admitting: Nurse Practitioner

## 2023-07-17 ENCOUNTER — Inpatient Hospital Stay: Payer: BC Managed Care – PPO | Attending: Oncology

## 2023-07-17 DIAGNOSIS — D500 Iron deficiency anemia secondary to blood loss (chronic): Secondary | ICD-10-CM | POA: Diagnosis not present

## 2023-07-17 DIAGNOSIS — D693 Immune thrombocytopenic purpura: Secondary | ICD-10-CM | POA: Insufficient documentation

## 2023-07-17 DIAGNOSIS — Z7952 Long term (current) use of systemic steroids: Secondary | ICD-10-CM | POA: Insufficient documentation

## 2023-07-17 DIAGNOSIS — N920 Excessive and frequent menstruation with regular cycle: Secondary | ICD-10-CM | POA: Diagnosis not present

## 2023-07-17 DIAGNOSIS — D696 Thrombocytopenia, unspecified: Secondary | ICD-10-CM

## 2023-07-17 DIAGNOSIS — Z79899 Other long term (current) drug therapy: Secondary | ICD-10-CM | POA: Diagnosis not present

## 2023-07-17 LAB — CBC WITH DIFFERENTIAL (CANCER CENTER ONLY)
Abs Immature Granulocytes: 0.02 10*3/uL (ref 0.00–0.07)
Basophils Absolute: 0 10*3/uL (ref 0.0–0.1)
Basophils Relative: 0 %
Eosinophils Absolute: 0.1 10*3/uL (ref 0.0–0.5)
Eosinophils Relative: 1 %
HCT: 31.1 % — ABNORMAL LOW (ref 36.0–46.0)
Hemoglobin: 10.2 g/dL — ABNORMAL LOW (ref 12.0–15.0)
Immature Granulocytes: 0 %
Lymphocytes Relative: 23 %
Lymphs Abs: 1.9 10*3/uL (ref 0.7–4.0)
MCH: 26.1 pg (ref 26.0–34.0)
MCHC: 32.8 g/dL (ref 30.0–36.0)
MCV: 79.5 fL — ABNORMAL LOW (ref 80.0–100.0)
Monocytes Absolute: 0.4 10*3/uL (ref 0.1–1.0)
Monocytes Relative: 5 %
Neutro Abs: 5.8 10*3/uL (ref 1.7–7.7)
Neutrophils Relative %: 71 %
Platelet Count: 46 10*3/uL — ABNORMAL LOW (ref 150–400)
RBC: 3.91 MIL/uL (ref 3.87–5.11)
RDW: 12.6 % (ref 11.5–15.5)
WBC Count: 8.3 10*3/uL (ref 4.0–10.5)
nRBC: 0 % (ref 0.0–0.2)

## 2023-07-21 ENCOUNTER — Other Ambulatory Visit (HOSPITAL_COMMUNITY): Payer: Self-pay

## 2023-07-22 ENCOUNTER — Other Ambulatory Visit (HOSPITAL_COMMUNITY): Payer: Self-pay

## 2023-07-24 ENCOUNTER — Other Ambulatory Visit: Payer: BC Managed Care – PPO

## 2023-07-24 ENCOUNTER — Ambulatory Visit: Payer: BC Managed Care – PPO | Admitting: Nurse Practitioner

## 2023-07-27 ENCOUNTER — Other Ambulatory Visit (HOSPITAL_COMMUNITY): Payer: Self-pay

## 2023-07-29 ENCOUNTER — Encounter (HOSPITAL_COMMUNITY): Payer: Self-pay

## 2023-07-29 ENCOUNTER — Other Ambulatory Visit (HOSPITAL_COMMUNITY): Payer: Self-pay

## 2023-07-31 ENCOUNTER — Other Ambulatory Visit (HOSPITAL_COMMUNITY): Payer: Self-pay

## 2023-07-31 ENCOUNTER — Inpatient Hospital Stay: Payer: BC Managed Care – PPO

## 2023-07-31 ENCOUNTER — Inpatient Hospital Stay: Payer: BC Managed Care – PPO | Admitting: Nurse Practitioner

## 2023-07-31 ENCOUNTER — Telehealth: Payer: Self-pay

## 2023-07-31 ENCOUNTER — Encounter: Payer: Self-pay | Admitting: Nurse Practitioner

## 2023-07-31 VITALS — BP 125/84 | HR 100 | Temp 98.1°F | Resp 18 | Ht 65.0 in | Wt 261.0 lb

## 2023-07-31 DIAGNOSIS — D696 Thrombocytopenia, unspecified: Secondary | ICD-10-CM

## 2023-07-31 DIAGNOSIS — D509 Iron deficiency anemia, unspecified: Secondary | ICD-10-CM

## 2023-07-31 DIAGNOSIS — D693 Immune thrombocytopenic purpura: Secondary | ICD-10-CM | POA: Diagnosis not present

## 2023-07-31 LAB — CBC WITH DIFFERENTIAL (CANCER CENTER ONLY)
Abs Immature Granulocytes: 0.01 10*3/uL (ref 0.00–0.07)
Basophils Absolute: 0 10*3/uL (ref 0.0–0.1)
Basophils Relative: 0 %
Eosinophils Absolute: 0.1 10*3/uL (ref 0.0–0.5)
Eosinophils Relative: 1 %
HCT: 29.7 % — ABNORMAL LOW (ref 36.0–46.0)
Hemoglobin: 9.8 g/dL — ABNORMAL LOW (ref 12.0–15.0)
Immature Granulocytes: 0 %
Lymphocytes Relative: 23 %
Lymphs Abs: 2.1 10*3/uL (ref 0.7–4.0)
MCH: 25.5 pg — ABNORMAL LOW (ref 26.0–34.0)
MCHC: 33 g/dL (ref 30.0–36.0)
MCV: 77.3 fL — ABNORMAL LOW (ref 80.0–100.0)
Monocytes Absolute: 0.6 10*3/uL (ref 0.1–1.0)
Monocytes Relative: 6 %
Neutro Abs: 6.3 10*3/uL (ref 1.7–7.7)
Neutrophils Relative %: 70 %
Platelet Count: 125 10*3/uL — ABNORMAL LOW (ref 150–400)
RBC: 3.84 MIL/uL — ABNORMAL LOW (ref 3.87–5.11)
RDW: 12.3 % (ref 11.5–15.5)
WBC Count: 9.1 10*3/uL (ref 4.0–10.5)
nRBC: 0 % (ref 0.0–0.2)

## 2023-07-31 LAB — CMP (CANCER CENTER ONLY)
ALT: 12 U/L (ref 0–44)
AST: 13 U/L — ABNORMAL LOW (ref 15–41)
Albumin: 4.2 g/dL (ref 3.5–5.0)
Alkaline Phosphatase: 80 U/L (ref 38–126)
Anion gap: 10 (ref 5–15)
BUN: 10 mg/dL (ref 6–20)
CO2: 24 mmol/L (ref 22–32)
Calcium: 9.4 mg/dL (ref 8.9–10.3)
Chloride: 105 mmol/L (ref 98–111)
Creatinine: 0.86 mg/dL (ref 0.44–1.00)
GFR, Estimated: >60 mL/min (ref >60)
Glucose, Bld: 92 mg/dL (ref 70–99)
Potassium: 3.5 mmol/L (ref 3.5–5.1)
Sodium: 139 mmol/L (ref 135–145)
Total Bilirubin: 0.4 mg/dL (ref 0.3–1.2)
Total Protein: 7.2 g/dL (ref 6.5–8.1)

## 2023-07-31 NOTE — Telephone Encounter (Signed)
Patient gave verbal understanding and had no further questions or concerns  

## 2023-07-31 NOTE — Progress Notes (Signed)
   [facility] OFF[facility] PROGRESS [facility]   Diagnosis: ITP  INTERVAL HISTORY:   [name] returns as scheduled.  Promacta dose was increased to 75 mg daily 06/26/2023.  She began the new dose on 07/01/2023.  She discontinued prednisone in late July.  No bleeding except related to her menstrual cycle.  Her most recent menstrual cycle was heavy with significant clots.  Typically the bleeding lasts 8 to 9 days and is heavy 3 of those days.  She began 1 oral iron tablet daily about a week ago.  No constipation or nausea.  Objective:  Vital signs in last 24 hours:  Blood pressure 125/84, pulse 100, temperature 98.1 F (36.7 C), temperature source Oral, resp. rate 18, height 5\' 5"  (1.651 m), weight 261 lb (118.4 kg), SpO2 100%.    HEENT: No thrush or ulcers.  No bleeding in the mouth. Resp: Lungs clear bilaterally. Cardio: Regular rate and rhythm. GI: No hepatosplenomegaly. Vascular: No leg edema.   Lab Results:  Lab Results  Component Value Date   WBC 9.1 07/31/2023   HGB 9.8 (L) 07/31/2023   HCT 29.7 (L) 07/31/2023   MCV 77.3 (L) 07/31/2023   PLT 125 (L) 07/31/2023   NEUTROABS 6.3 07/31/2023    Imaging:  No results found.  Medications: I have reviewed the patient's current medications.  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  History of anemia secondary to menorrhagia and iron deficiency-recurrent iron deficiency anemia August 2024  Disposition: [name] appears stable.  The platelet count is higher.  She will continue Promacta at the current dose.  She has developed recurrent microcytic anemia, likely due to menorrhagia and iron deficiency.  She will increase oral iron to twice daily.  Follow-up CBC and ferritin in 3 weeks.  Lab and follow-up in 6 weeks.  We are available to see her sooner if needed.    Lonna Cobb ANP/GNP-BC   07/31/2023  3:29 PM

## 2023-07-31 NOTE — Telephone Encounter (Signed)
-----   Message from Lonna Cobb sent at 07/31/2023  3:41 PM EDT ----- Please let her know LFTs look good.  Follow-up as scheduled.  Call with any bleeding.

## 2023-08-03 ENCOUNTER — Other Ambulatory Visit: Payer: Self-pay

## 2023-08-03 ENCOUNTER — Other Ambulatory Visit (HOSPITAL_COMMUNITY): Payer: Self-pay

## 2023-08-04 ENCOUNTER — Other Ambulatory Visit (HOSPITAL_COMMUNITY): Payer: Self-pay

## 2023-08-04 ENCOUNTER — Other Ambulatory Visit: Payer: Self-pay

## 2023-08-05 ENCOUNTER — Other Ambulatory Visit (HOSPITAL_COMMUNITY): Payer: Self-pay

## 2023-08-21 ENCOUNTER — Inpatient Hospital Stay: Payer: BC Managed Care – PPO | Attending: Oncology

## 2023-08-21 DIAGNOSIS — N920 Excessive and frequent menstruation with regular cycle: Secondary | ICD-10-CM | POA: Diagnosis not present

## 2023-08-21 DIAGNOSIS — D693 Immune thrombocytopenic purpura: Secondary | ICD-10-CM | POA: Insufficient documentation

## 2023-08-21 DIAGNOSIS — D500 Iron deficiency anemia secondary to blood loss (chronic): Secondary | ICD-10-CM | POA: Insufficient documentation

## 2023-08-21 DIAGNOSIS — D509 Iron deficiency anemia, unspecified: Secondary | ICD-10-CM

## 2023-08-21 DIAGNOSIS — Z79899 Other long term (current) drug therapy: Secondary | ICD-10-CM | POA: Diagnosis not present

## 2023-08-21 DIAGNOSIS — D696 Thrombocytopenia, unspecified: Secondary | ICD-10-CM

## 2023-08-21 LAB — CBC WITH DIFFERENTIAL (CANCER CENTER ONLY)
Abs Immature Granulocytes: 0.03 10*3/uL (ref 0.00–0.07)
Basophils Absolute: 0 10*3/uL (ref 0.0–0.1)
Basophils Relative: 0 %
Eosinophils Absolute: 0.1 10*3/uL (ref 0.0–0.5)
Eosinophils Relative: 2 %
HCT: 35.4 % — ABNORMAL LOW (ref 36.0–46.0)
Hemoglobin: 11.1 g/dL — ABNORMAL LOW (ref 12.0–15.0)
Immature Granulocytes: 0 %
Lymphocytes Relative: 24 %
Lymphs Abs: 1.9 10*3/uL (ref 0.7–4.0)
MCH: 24.3 pg — ABNORMAL LOW (ref 26.0–34.0)
MCHC: 31.4 g/dL (ref 30.0–36.0)
MCV: 77.5 fL — ABNORMAL LOW (ref 80.0–100.0)
Monocytes Absolute: 0.4 10*3/uL (ref 0.1–1.0)
Monocytes Relative: 5 %
Neutro Abs: 5.7 10*3/uL (ref 1.7–7.7)
Neutrophils Relative %: 69 %
Platelet Count: 70 10*3/uL — ABNORMAL LOW (ref 150–400)
RBC: 4.57 MIL/uL (ref 3.87–5.11)
RDW: 13.3 % (ref 11.5–15.5)
WBC Count: 8.2 10*3/uL (ref 4.0–10.5)
nRBC: 0 % (ref 0.0–0.2)

## 2023-08-21 LAB — FERRITIN: Ferritin: 14 ng/mL (ref 11–307)

## 2023-08-25 ENCOUNTER — Telehealth: Payer: Self-pay

## 2023-08-25 ENCOUNTER — Other Ambulatory Visit (HOSPITAL_COMMUNITY): Payer: Self-pay

## 2023-08-26 ENCOUNTER — Encounter: Payer: Self-pay | Admitting: Oncology

## 2023-08-26 NOTE — Telephone Encounter (Signed)
I called the patient multiple times and left a message on MyChart. I informed her that her hemoglobin levels have increased and advised her to continue taking oral iron. Additionally, I noted that her platelet count is slightly lower, and I recommended continuing Promacta with follow-up as scheduled.

## 2023-08-27 ENCOUNTER — Other Ambulatory Visit (HOSPITAL_COMMUNITY): Payer: Self-pay

## 2023-08-31 ENCOUNTER — Other Ambulatory Visit (HOSPITAL_COMMUNITY): Payer: Self-pay

## 2023-08-31 ENCOUNTER — Encounter (HOSPITAL_COMMUNITY): Payer: Self-pay

## 2023-09-09 ENCOUNTER — Other Ambulatory Visit: Payer: Self-pay

## 2023-09-09 NOTE — Progress Notes (Signed)
Specialty Pharmacy Refill Coordination Note  Daisy Meyers is a 27 y.o. female contacted today regarding refills of specialty medication(s) Eltrombopag Olamine .  Patient requested [name] at [facility] at [place]  on 09/21/23   Medication will be filled on 09/21/23.

## 2023-09-11 ENCOUNTER — Inpatient Hospital Stay: Payer: BC Managed Care – PPO

## 2023-09-11 ENCOUNTER — Encounter: Payer: Self-pay | Admitting: Nurse Practitioner

## 2023-09-11 ENCOUNTER — Inpatient Hospital Stay: Payer: BC Managed Care – PPO | Admitting: Nurse Practitioner

## 2023-09-11 VITALS — BP 124/77 | HR 86 | Temp 98.1°F | Resp 18 | Ht 65.0 in | Wt 262.0 lb

## 2023-09-11 DIAGNOSIS — D693 Immune thrombocytopenic purpura: Secondary | ICD-10-CM | POA: Diagnosis not present

## 2023-09-11 DIAGNOSIS — D696 Thrombocytopenia, unspecified: Secondary | ICD-10-CM

## 2023-09-11 DIAGNOSIS — D509 Iron deficiency anemia, unspecified: Secondary | ICD-10-CM

## 2023-09-11 LAB — CBC WITH DIFFERENTIAL (CANCER CENTER ONLY)
Abs Immature Granulocytes: 0.02 10*3/uL (ref 0.00–0.07)
Basophils Absolute: 0 10*3/uL (ref 0.0–0.1)
Basophils Relative: 0 %
Eosinophils Absolute: 0.2 10*3/uL (ref 0.0–0.5)
Eosinophils Relative: 2 %
HCT: 34.4 % — ABNORMAL LOW (ref 36.0–46.0)
Hemoglobin: 11 g/dL — ABNORMAL LOW (ref 12.0–15.0)
Immature Granulocytes: 0 %
Lymphocytes Relative: 23 %
Lymphs Abs: 1.9 10*3/uL (ref 0.7–4.0)
MCH: 24.3 pg — ABNORMAL LOW (ref 26.0–34.0)
MCHC: 32 g/dL (ref 30.0–36.0)
MCV: 75.9 fL — ABNORMAL LOW (ref 80.0–100.0)
Monocytes Absolute: 0.4 10*3/uL (ref 0.1–1.0)
Monocytes Relative: 4 %
Neutro Abs: 6 10*3/uL (ref 1.7–7.7)
Neutrophils Relative %: 71 %
Platelet Count: 31 10*3/uL — ABNORMAL LOW (ref 150–400)
RBC: 4.53 MIL/uL (ref 3.87–5.11)
RDW: 14.1 % (ref 11.5–15.5)
WBC Count: 8.4 10*3/uL (ref 4.0–10.5)
nRBC: 0 % (ref 0.0–0.2)

## 2023-09-11 LAB — CMP (CANCER CENTER ONLY)
ALT: 21 U/L (ref 0–44)
AST: 20 U/L (ref 15–41)
Albumin: 4.4 g/dL (ref 3.5–5.0)
Alkaline Phosphatase: 83 U/L (ref 38–126)
Anion gap: 10 (ref 5–15)
BUN: 14 mg/dL (ref 6–20)
CO2: 23 mmol/L (ref 22–32)
Calcium: 9.1 mg/dL (ref 8.9–10.3)
Chloride: 104 mmol/L (ref 98–111)
Creatinine: 0.74 mg/dL (ref 0.44–1.00)
GFR, Estimated: >60 mL/min (ref >60)
Glucose, Bld: 86 mg/dL (ref 70–99)
Potassium: 3.6 mmol/L (ref 3.5–5.1)
Sodium: 137 mmol/L (ref 135–145)
Total Bilirubin: 0.5 mg/dL (ref 0.3–1.2)
Total Protein: 7.5 g/dL (ref 6.5–8.1)

## 2023-09-11 LAB — FERRITIN: Ferritin: 8 ng/mL — ABNORMAL LOW (ref 11–307)

## 2023-09-11 NOTE — Progress Notes (Signed)
  [facility] OFFICE PROGRESS NOTE   Diagnosis: ITP  INTERVAL HISTORY:   Daisy Meyers returns as scheduled.  She continues Promacta.  She acknowledges missing multiple doses each week.  She denies bleeding.  She continues oral iron.  Energy level is better.  No constipation or nausea.  Objective:  Vital signs in last 24 hours:  Blood pressure 124/77, pulse 86, temperature 98.1 F (36.7 C), temperature source Tympanic, resp. rate 18, height 5\' 5"  (1.651 m), weight 262 lb (118.8 kg), SpO2 100%.    HEENT: No bleeding in the mouth. Resp: Lungs clear bilaterally. Cardio: Regular rate and rhythm. GI: No hepatosplenomegaly. Vascular: No leg edema. Skin: No petechiae.   Lab Results:  Lab Results  Component Value Date   WBC 8.2 08/21/2023   HGB 11.1 (L) 08/21/2023   HCT 35.4 (L) 08/21/2023   MCV 77.5 (L) 08/21/2023   PLT 70 (L) 08/21/2023   NEUTROABS 5.7 08/21/2023    Imaging:  No results found.  Medications: I have reviewed the patient's current medications.  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  History of anemia secondary to menorrhagia and iron deficiency-recurrent  iron deficiency anemia August 2024  Disposition: Daisy Meyers appears stable.  The platelet count is lower, 31,000.  She acknowledges missing multiple doses of Promacta each week.  We discussed the importance of taking on a daily basis.  She is leaving on a cruise tomorrow with the plan to return 09/19/2023.  We will make arrangements for a lab appointment on 09/21/2023.  She will seek medical attention if she has spontaneous bruising/bleeding while out of town.  She has iron deficiency anemia.  The hemoglobin is stable at 11.  We will follow-up on the ferritin from today.  She will continue oral iron.  She will return for a lab appointment on 09/21/2023 and a follow-up visit in 6 weeks.    Lonna Cobb ANP/GNP-BC   09/11/2023  1:28 PM

## 2023-09-21 ENCOUNTER — Other Ambulatory Visit (HOSPITAL_COMMUNITY): Payer: Self-pay

## 2023-09-21 ENCOUNTER — Encounter: Payer: Self-pay | Admitting: Nurse Practitioner

## 2023-09-21 ENCOUNTER — Inpatient Hospital Stay: Payer: BC Managed Care – PPO

## 2023-09-22 ENCOUNTER — Inpatient Hospital Stay: Payer: BC Managed Care – PPO | Attending: Oncology

## 2023-09-22 DIAGNOSIS — D693 Immune thrombocytopenic purpura: Secondary | ICD-10-CM | POA: Insufficient documentation

## 2023-09-22 DIAGNOSIS — D696 Thrombocytopenia, unspecified: Secondary | ICD-10-CM

## 2023-09-22 DIAGNOSIS — Z79899 Other long term (current) drug therapy: Secondary | ICD-10-CM | POA: Diagnosis not present

## 2023-09-22 LAB — CBC WITH DIFFERENTIAL (CANCER CENTER ONLY)
Abs Immature Granulocytes: 0.02 10*3/uL (ref 0.00–0.07)
Basophils Absolute: 0 10*3/uL (ref 0.0–0.1)
Basophils Relative: 0 %
Eosinophils Absolute: 0.2 10*3/uL (ref 0.0–0.5)
Eosinophils Relative: 2 %
HCT: 34.3 % — ABNORMAL LOW (ref 36.0–46.0)
Hemoglobin: 10.8 g/dL — ABNORMAL LOW (ref 12.0–15.0)
Immature Granulocytes: 0 %
Lymphocytes Relative: 20 %
Lymphs Abs: 1.5 10*3/uL (ref 0.7–4.0)
MCH: 23.7 pg — ABNORMAL LOW (ref 26.0–34.0)
MCHC: 31.5 g/dL (ref 30.0–36.0)
MCV: 75.2 fL — ABNORMAL LOW (ref 80.0–100.0)
Monocytes Absolute: 0.4 10*3/uL (ref 0.1–1.0)
Monocytes Relative: 6 %
Neutro Abs: 5.6 10*3/uL (ref 1.7–7.7)
Neutrophils Relative %: 72 %
Platelet Count: 54 10*3/uL — ABNORMAL LOW (ref 150–400)
RBC: 4.56 MIL/uL (ref 3.87–5.11)
RDW: 14.8 % (ref 11.5–15.5)
WBC Count: 7.7 10*3/uL (ref 4.0–10.5)
nRBC: 0 % (ref 0.0–0.2)

## 2023-09-25 ENCOUNTER — Telehealth: Payer: Self-pay

## 2023-09-25 NOTE — Telephone Encounter (Signed)
-----   Message from Lonna Cobb sent at 09/25/2023 12:10 PM EDT ----- Please call her.  Is she taking oral iron?

## 2023-09-25 NOTE — Telephone Encounter (Signed)
The patient reported taking the iron supplement once daily. I advised her to increase the dosage to twice daily as per Lisa's recommendation. The patient acknowledged this guidance and indicated that she had no further questions or concerns.

## 2023-09-29 ENCOUNTER — Other Ambulatory Visit: Payer: Self-pay

## 2023-09-29 ENCOUNTER — Encounter: Payer: Self-pay | Admitting: Oncology

## 2023-09-29 DIAGNOSIS — D696 Thrombocytopenia, unspecified: Secondary | ICD-10-CM

## 2023-10-08 ENCOUNTER — Other Ambulatory Visit: Payer: Self-pay | Admitting: Oncology

## 2023-10-08 ENCOUNTER — Other Ambulatory Visit: Payer: Self-pay

## 2023-10-08 DIAGNOSIS — D696 Thrombocytopenia, unspecified: Secondary | ICD-10-CM

## 2023-10-08 NOTE — Progress Notes (Signed)
Specialty Pharmacy Refill Coordination Note  Daisy Meyers is a 27 y.o. female contacted today regarding refills of specialty medication(s) Eltrombopag Olamine   Patient requested Pickup at [facility] at [place] date: 10/19/23   Medication will be filled on 10/16/23.

## 2023-10-09 ENCOUNTER — Other Ambulatory Visit: Payer: Self-pay

## 2023-10-09 ENCOUNTER — Other Ambulatory Visit (HOSPITAL_COMMUNITY): Payer: Self-pay

## 2023-10-09 MED ORDER — ELTROMBOPAG OLAMINE 75 MG PO TABS
75.0000 mg | ORAL_TABLET | Freq: Every day | ORAL | 2 refills | Status: DC
  Filled 2023-10-09: qty 30, 30d supply, fill #0
  Filled 2023-12-10: qty 30, 30d supply, fill #1
  Filled 2024-01-07: qty 30, 30d supply, fill #2

## 2023-10-13 ENCOUNTER — Other Ambulatory Visit: Payer: BC Managed Care – PPO

## 2023-10-16 ENCOUNTER — Inpatient Hospital Stay: Payer: BC Managed Care – PPO | Attending: Oncology

## 2023-10-16 DIAGNOSIS — Z79899 Other long term (current) drug therapy: Secondary | ICD-10-CM | POA: Insufficient documentation

## 2023-10-16 DIAGNOSIS — D693 Immune thrombocytopenic purpura: Secondary | ICD-10-CM | POA: Diagnosis present

## 2023-10-16 DIAGNOSIS — D696 Thrombocytopenia, unspecified: Secondary | ICD-10-CM

## 2023-10-16 LAB — CBC WITH DIFFERENTIAL (CANCER CENTER ONLY)
Abs Immature Granulocytes: 0.01 10*3/uL (ref 0.00–0.07)
Basophils Absolute: 0 10*3/uL (ref 0.0–0.1)
Basophils Relative: 0 %
Eosinophils Absolute: 0.1 10*3/uL (ref 0.0–0.5)
Eosinophils Relative: 1 %
HCT: 35.6 % — ABNORMAL LOW (ref 36.0–46.0)
Hemoglobin: 11.2 g/dL — ABNORMAL LOW (ref 12.0–15.0)
Immature Granulocytes: 0 %
Lymphocytes Relative: 24 %
Lymphs Abs: 2.1 10*3/uL (ref 0.7–4.0)
MCH: 23.3 pg — ABNORMAL LOW (ref 26.0–34.0)
MCHC: 31.5 g/dL (ref 30.0–36.0)
MCV: 74 fL — ABNORMAL LOW (ref 80.0–100.0)
Monocytes Absolute: 0.5 10*3/uL (ref 0.1–1.0)
Monocytes Relative: 6 %
Neutro Abs: 6.1 10*3/uL (ref 1.7–7.7)
Neutrophils Relative %: 69 %
Platelet Count: 103 10*3/uL — ABNORMAL LOW (ref 150–400)
RBC: 4.81 MIL/uL (ref 3.87–5.11)
RDW: 15.1 % (ref 11.5–15.5)
WBC Count: 8.8 10*3/uL (ref 4.0–10.5)
nRBC: 0 % (ref 0.0–0.2)

## 2023-10-20 ENCOUNTER — Encounter: Payer: Self-pay | Admitting: Oncology

## 2023-10-23 ENCOUNTER — Other Ambulatory Visit (HOSPITAL_COMMUNITY): Payer: Self-pay

## 2023-10-30 ENCOUNTER — Inpatient Hospital Stay: Payer: BC Managed Care – PPO | Admitting: Oncology

## 2023-10-30 ENCOUNTER — Inpatient Hospital Stay: Payer: BC Managed Care – PPO

## 2023-11-05 ENCOUNTER — Other Ambulatory Visit: Payer: Self-pay

## 2023-11-09 ENCOUNTER — Other Ambulatory Visit: Payer: Self-pay

## 2023-11-10 ENCOUNTER — Other Ambulatory Visit: Payer: Self-pay | Admitting: *Deleted

## 2023-11-10 ENCOUNTER — Inpatient Hospital Stay: Payer: BC Managed Care – PPO | Admitting: Oncology

## 2023-11-10 ENCOUNTER — Inpatient Hospital Stay: Payer: BC Managed Care – PPO

## 2023-11-10 DIAGNOSIS — D509 Iron deficiency anemia, unspecified: Secondary | ICD-10-CM

## 2023-11-10 DIAGNOSIS — D696 Thrombocytopenia, unspecified: Secondary | ICD-10-CM

## 2023-11-10 NOTE — Progress Notes (Signed)
Lab orders entered

## 2023-11-11 ENCOUNTER — Other Ambulatory Visit: Payer: Self-pay

## 2023-11-16 ENCOUNTER — Other Ambulatory Visit (HOSPITAL_COMMUNITY): Payer: Self-pay

## 2023-11-18 ENCOUNTER — Other Ambulatory Visit: Payer: Self-pay

## 2023-11-18 NOTE — Progress Notes (Signed)
Specialty Pharmacy Ongoing Clinical Assessment Note  Daisy Meyers is a 27 y.o. 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   Therapeutic benefit summary: Patient is achieving benefit   Adverse events/side effects summary: No adverse events/side effects (Patient has been having vision changes since being on Prednisone. She is unsure whether this is related at all to the [facility]. Provider asked her to wait until at least the end of the year to schedule eye exam to ensure Prednisone was out of her system.)   Patient's therapy is appropriate to: Continue    Goals Addressed             This Visit's Progress    Reduce long-term complications       Patient is on track. Patient will maintain adherence. Per recent provider note labs are improving and patient is to continue therapy.          Follow up:  6 months  Otto Herb Specialty Pharmacist

## 2023-12-04 ENCOUNTER — Ambulatory Visit: Payer: BC Managed Care – PPO | Admitting: Oncology

## 2023-12-04 ENCOUNTER — Other Ambulatory Visit: Payer: BC Managed Care – PPO

## 2023-12-10 ENCOUNTER — Encounter: Payer: Self-pay | Admitting: Nurse Practitioner

## 2023-12-10 ENCOUNTER — Other Ambulatory Visit: Payer: Self-pay

## 2023-12-10 NOTE — Progress Notes (Signed)
Specialty Pharmacy Refill Coordination Note  Daisy Meyers is a 27 y.o. female contacted today regarding refills of specialty medication(s) Eltrombopag Olamine [facility])   Patient requested (Patient-Rptd) Pickup at [facility] at [facility] date: (Patient-Rptd) 12/14/23   Medication will be filled on 12.27.24.

## 2023-12-11 ENCOUNTER — Encounter: Payer: Self-pay | Admitting: Nurse Practitioner

## 2023-12-11 ENCOUNTER — Inpatient Hospital Stay: Payer: BC Managed Care – PPO | Attending: Oncology

## 2023-12-11 ENCOUNTER — Inpatient Hospital Stay: Payer: BC Managed Care – PPO | Admitting: Nurse Practitioner

## 2023-12-11 ENCOUNTER — Inpatient Hospital Stay: Payer: BC Managed Care – PPO

## 2023-12-11 ENCOUNTER — Other Ambulatory Visit: Payer: Self-pay

## 2023-12-11 VITALS — BP 120/82 | HR 95 | Temp 98.1°F | Resp 18 | Ht 65.0 in | Wt 264.1 lb

## 2023-12-11 DIAGNOSIS — D693 Immune thrombocytopenic purpura: Secondary | ICD-10-CM | POA: Insufficient documentation

## 2023-12-11 DIAGNOSIS — N920 Excessive and frequent menstruation with regular cycle: Secondary | ICD-10-CM | POA: Diagnosis not present

## 2023-12-11 DIAGNOSIS — Z79899 Other long term (current) drug therapy: Secondary | ICD-10-CM | POA: Insufficient documentation

## 2023-12-11 DIAGNOSIS — D509 Iron deficiency anemia, unspecified: Secondary | ICD-10-CM

## 2023-12-11 DIAGNOSIS — D696 Thrombocytopenia, unspecified: Secondary | ICD-10-CM | POA: Diagnosis not present

## 2023-12-11 DIAGNOSIS — D500 Iron deficiency anemia secondary to blood loss (chronic): Secondary | ICD-10-CM | POA: Diagnosis not present

## 2023-12-11 LAB — CBC WITH DIFFERENTIAL (CANCER CENTER ONLY)
Abs Immature Granulocytes: 0.03 10*3/uL (ref 0.00–0.07)
Basophils Absolute: 0 10*3/uL (ref 0.0–0.1)
Basophils Relative: 0 %
Eosinophils Absolute: 0.3 10*3/uL (ref 0.0–0.5)
Eosinophils Relative: 3 %
HCT: 32.6 % — ABNORMAL LOW (ref 36.0–46.0)
Hemoglobin: 10.2 g/dL — ABNORMAL LOW (ref 12.0–15.0)
Immature Granulocytes: 0 %
Lymphocytes Relative: 27 %
Lymphs Abs: 2.6 10*3/uL (ref 0.7–4.0)
MCH: 23.1 pg — ABNORMAL LOW (ref 26.0–34.0)
MCHC: 31.3 g/dL (ref 30.0–36.0)
MCV: 73.9 fL — ABNORMAL LOW (ref 80.0–100.0)
Monocytes Absolute: 0.6 10*3/uL (ref 0.1–1.0)
Monocytes Relative: 6 %
Neutro Abs: 6.1 10*3/uL (ref 1.7–7.7)
Neutrophils Relative %: 64 %
Platelet Count: 69 10*3/uL — ABNORMAL LOW (ref 150–400)
RBC: 4.41 MIL/uL (ref 3.87–5.11)
RDW: 15.6 % — ABNORMAL HIGH (ref 11.5–15.5)
WBC Count: 9.6 10*3/uL (ref 4.0–10.5)
nRBC: 0 % (ref 0.0–0.2)

## 2023-12-11 LAB — CMP (CANCER CENTER ONLY)
ALT: 12 U/L (ref 0–44)
AST: 13 U/L — ABNORMAL LOW (ref 15–41)
Albumin: 3.9 g/dL (ref 3.5–5.0)
Alkaline Phosphatase: 88 U/L (ref 38–126)
Anion gap: 8 (ref 5–15)
BUN: 14 mg/dL (ref 6–20)
CO2: 22 mmol/L (ref 22–32)
Calcium: 8.8 mg/dL — ABNORMAL LOW (ref 8.9–10.3)
Chloride: 106 mmol/L (ref 98–111)
Creatinine: 0.76 mg/dL (ref 0.44–1.00)
GFR, Estimated: >60 mL/min (ref >60)
Glucose, Bld: 84 mg/dL (ref 70–99)
Potassium: 3.7 mmol/L (ref 3.5–5.1)
Sodium: 136 mmol/L (ref 135–145)
Total Bilirubin: 0.4 mg/dL (ref <1.2)
Total Protein: 7.2 g/dL (ref 6.5–8.1)

## 2023-12-11 LAB — FERRITIN: Ferritin: 7 ng/mL — ABNORMAL LOW (ref 11–307)

## 2023-12-11 NOTE — Progress Notes (Signed)
  [facility] OFFICE PROGRESS NOTE   Diagnosis: ITP, iron deficiency anemia  INTERVAL HISTORY:   Daisy Meyers returns for follow-up.  She continues Promacta.  No bleeding aside from her monthly menstrual cycle.  The cycle typically lasts about 7 days, heavy 2 or 3 of those days.  She is not taking oral iron consistently.  She notes that her voice has become raspy over the past several months.  She wonders if this is a side effect of Promacta.  She initially noted low back/sacral pain in May, occurred occasionally.  The pain has become more persistent.  No leg weakness or numbness.  No known injury.  Objective:  Vital signs in last 24 hours:  Blood pressure 120/82, pulse 95, temperature 98.1 F (36.7 C), temperature source Temporal, resp. rate 18, height 5\' 5"  (1.651 m), weight 264 lb 1.6 oz (119.8 kg), SpO2 100%.    Resp: Lungs clear bilaterally. Cardio: Regular rate and rhythm. GI: No hepatosplenomegaly. Vascular: No leg edema.   Lab Results:  Lab Results  Component Value Date   WBC 9.6 12/11/2023   HGB 10.2 (L) 12/11/2023   HCT 32.6 (L) 12/11/2023   MCV 73.9 (L) 12/11/2023   PLT 69 (L) 12/11/2023   NEUTROABS 6.1 12/11/2023    Imaging:  No results found.  Medications: I have reviewed the patient's current medications.  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  History of anemia secondary to menorrhagia and iron deficiency-recurrent iron deficiency anemia August 2024  Disposition: Daisy Meyers appears stable.  She continues Promacta.  We reviewed the CBC from today.  Platelet count in an adequate range at 69,000.  She has progressive anemia.  Indices consistent with iron deficiency.  We will follow-up on the ferritin.  She will resume oral iron.  If she is unable to tolerate oral iron we can consider IV iron.  She agrees with this plan.  She has noted a change in her voice since beginning Promacta.  I discussed with the [facility] pharmacist.  Unlikely the voice issue is related to Promacta.  She would like to have this investigated further.  Referral placed to ENT.  She plans to follow-up with orthopedics for evaluation of the low back/sacral pain.  She will return for lab and follow-up in 1 month.  She will contact the office in the interim with any problems.   Daisy Meyers ANP/GNP-BC   12/11/2023  3:10 PM

## 2023-12-14 ENCOUNTER — Other Ambulatory Visit (HOSPITAL_COMMUNITY): Payer: Self-pay

## 2023-12-17 ENCOUNTER — Encounter: Payer: Self-pay | Admitting: *Deleted

## 2023-12-17 NOTE — Progress Notes (Signed)
 Faxed referral order, demographics and medical records to [facility]. Fax #[phone].

## 2023-12-18 ENCOUNTER — Inpatient Hospital Stay: Payer: BC Managed Care – PPO | Admitting: Nurse Practitioner

## 2023-12-18 ENCOUNTER — Telehealth: Payer: Self-pay

## 2023-12-18 ENCOUNTER — Inpatient Hospital Stay: Payer: BC Managed Care – PPO

## 2023-12-18 NOTE — Telephone Encounter (Signed)
 The patient has indicated that she understands her ferritin levels are still low. She also mentioned that she has resumed taking her oral iron supplements.

## 2023-12-18 NOTE — Telephone Encounter (Signed)
-----   Message from Lonna Cobb sent at 12/14/2023  4:49 PM EST ----- Please let her know the ferritin remains low.  Did she resume oral iron?

## 2023-12-31 ENCOUNTER — Encounter (INDEPENDENT_AMBULATORY_CARE_PROVIDER_SITE_OTHER): Payer: Self-pay | Admitting: Otolaryngology

## 2023-12-31 ENCOUNTER — Other Ambulatory Visit: Payer: Self-pay

## 2024-01-01 ENCOUNTER — Encounter: Payer: Self-pay | Admitting: *Deleted

## 2024-01-04 ENCOUNTER — Other Ambulatory Visit: Payer: Self-pay

## 2024-01-07 ENCOUNTER — Other Ambulatory Visit: Payer: Self-pay

## 2024-01-07 NOTE — Progress Notes (Signed)
Specialty Pharmacy Refill Coordination Note  Daisy Meyers is a 28 y.o. female contacted today regarding refills of specialty medication(s) Eltrombopag Olamine [name]   Patient requested Pickup at [facility] at [facility] date: 01/12/24   Medication will be filled on 01.27.25.

## 2024-01-08 ENCOUNTER — Inpatient Hospital Stay: Payer: BC Managed Care – PPO | Admitting: Nurse Practitioner

## 2024-01-08 ENCOUNTER — Inpatient Hospital Stay: Payer: BC Managed Care – PPO | Attending: Oncology

## 2024-01-08 ENCOUNTER — Encounter: Payer: Self-pay | Admitting: Nurse Practitioner

## 2024-01-08 VITALS — BP 118/89 | HR 81 | Temp 98.1°F | Resp 18 | Ht 65.0 in | Wt 262.4 lb

## 2024-01-08 DIAGNOSIS — N920 Excessive and frequent menstruation with regular cycle: Secondary | ICD-10-CM | POA: Insufficient documentation

## 2024-01-08 DIAGNOSIS — D509 Iron deficiency anemia, unspecified: Secondary | ICD-10-CM

## 2024-01-08 DIAGNOSIS — Z79899 Other long term (current) drug therapy: Secondary | ICD-10-CM | POA: Diagnosis not present

## 2024-01-08 DIAGNOSIS — D693 Immune thrombocytopenic purpura: Secondary | ICD-10-CM | POA: Diagnosis present

## 2024-01-08 DIAGNOSIS — M545 Low back pain, unspecified: Secondary | ICD-10-CM | POA: Diagnosis not present

## 2024-01-08 DIAGNOSIS — Z7952 Long term (current) use of systemic steroids: Secondary | ICD-10-CM | POA: Insufficient documentation

## 2024-01-08 DIAGNOSIS — D696 Thrombocytopenia, unspecified: Secondary | ICD-10-CM

## 2024-01-08 DIAGNOSIS — R490 Dysphonia: Secondary | ICD-10-CM | POA: Diagnosis not present

## 2024-01-08 DIAGNOSIS — D500 Iron deficiency anemia secondary to blood loss (chronic): Secondary | ICD-10-CM | POA: Diagnosis not present

## 2024-01-08 LAB — FERRITIN: Ferritin: 14 ng/mL (ref 11–307)

## 2024-01-08 LAB — CBC WITH DIFFERENTIAL (CANCER CENTER ONLY)
Abs Immature Granulocytes: 0.02 10*3/uL (ref 0.00–0.07)
Basophils Absolute: 0 10*3/uL (ref 0.0–0.1)
Basophils Relative: 0 %
Eosinophils Absolute: 0.1 10*3/uL (ref 0.0–0.5)
Eosinophils Relative: 1 %
HCT: 35.4 % — ABNORMAL LOW (ref 36.0–46.0)
Hemoglobin: 11.2 g/dL — ABNORMAL LOW (ref 12.0–15.0)
Immature Granulocytes: 0 %
Lymphocytes Relative: 24 %
Lymphs Abs: 2.2 10*3/uL (ref 0.7–4.0)
MCH: 23.7 pg — ABNORMAL LOW (ref 26.0–34.0)
MCHC: 31.6 g/dL (ref 30.0–36.0)
MCV: 74.8 fL — ABNORMAL LOW (ref 80.0–100.0)
Monocytes Absolute: 0.4 10*3/uL (ref 0.1–1.0)
Monocytes Relative: 5 %
Neutro Abs: 6.4 10*3/uL (ref 1.7–7.7)
Neutrophils Relative %: 70 %
Platelet Count: 37 10*3/uL — ABNORMAL LOW (ref 150–400)
RBC Morphology: NONE SEEN
RBC: 4.73 MIL/uL (ref 3.87–5.11)
RDW: 15.2 % (ref 11.5–15.5)
WBC Count: 9.1 10*3/uL (ref 4.0–10.5)
nRBC: 0 % (ref 0.0–0.2)

## 2024-01-08 NOTE — Progress Notes (Signed)
  Daisy Meyers OFFICE PROGRESS NOTE   Diagnosis: ITP, iron deficiency anemia  INTERVAL HISTORY:   Daisy Meyers returns as scheduled.  She continues Promacta.  No bleeding other than monthly menstrual bleeding.  She is taking 2 iron tablets a day.  No nausea or constipation.  Low back pain is better since getting new shoes.  She continues to have intermittent hoarseness, unchanged.  She is scheduled to see ENT in March.  Objective:  Vital signs in last 24 hours:  Blood pressure 118/89, pulse 81, temperature 98.1 F (36.7 C), resp. rate 18, height 5\' 5"  (1.651 m), weight 262 lb 6.4 oz (119 kg), SpO2 98%.    HEENT: No blood in the mouth. Resp: Lungs clear bilaterally. Cardio: Regular rate and rhythm. GI: No hepatosplenomegaly. Vascular: No leg edema. Skin: No petechiae.   Lab Results:  Lab Results  Component Value Date   WBC 9.6 12/11/2023   HGB 10.2 (L) 12/11/2023   HCT 32.6 (L) 12/11/2023   MCV 73.9 (L) 12/11/2023   PLT 69 (L) 12/11/2023   NEUTROABS 6.1 12/11/2023    Imaging:  No results found.  Medications: I have reviewed the patient's current medications.  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  History of anemia secondary to menorrhagia and iron deficiency-recurrent iron deficiency anemia August 2024  Disposition: Ms. Witcher appears stable.  She continues Promacta.  Platelet count is lower today, 37,000.  She reports missing several doses of Promacta each week.  She will try to take on a consistent basis.  We discussed other treatment options such as pulse Decadron, splenectomy if unable to maintain an adequate platelet count on Promacta.  Hemoglobin is higher.  We will follow-up on the ferritin.  She will continue oral iron.  She will return for lab and follow-up in 3 weeks.  She will contact the office in the interim with any bleeding.    Daisy Meyers ANP/GNP-BC   01/08/2024  1:11 PM

## 2024-01-11 ENCOUNTER — Other Ambulatory Visit: Payer: Self-pay

## 2024-01-12 ENCOUNTER — Other Ambulatory Visit: Payer: Self-pay

## 2024-01-13 ENCOUNTER — Other Ambulatory Visit (HOSPITAL_COMMUNITY): Payer: Self-pay

## 2024-01-28 ENCOUNTER — Other Ambulatory Visit: Payer: Self-pay

## 2024-01-28 ENCOUNTER — Encounter: Payer: Self-pay | Admitting: Nurse Practitioner

## 2024-01-28 ENCOUNTER — Encounter: Payer: Self-pay | Admitting: Oncology

## 2024-01-28 ENCOUNTER — Other Ambulatory Visit (HOSPITAL_COMMUNITY): Payer: Self-pay

## 2024-01-28 ENCOUNTER — Inpatient Hospital Stay: Payer: BC Managed Care – PPO | Admitting: Nurse Practitioner

## 2024-01-28 ENCOUNTER — Inpatient Hospital Stay: Payer: BC Managed Care – PPO | Attending: Oncology

## 2024-01-28 DIAGNOSIS — D500 Iron deficiency anemia secondary to blood loss (chronic): Secondary | ICD-10-CM | POA: Insufficient documentation

## 2024-01-28 DIAGNOSIS — Z79899 Other long term (current) drug therapy: Secondary | ICD-10-CM | POA: Diagnosis not present

## 2024-01-28 DIAGNOSIS — R110 Nausea: Secondary | ICD-10-CM | POA: Diagnosis not present

## 2024-01-28 DIAGNOSIS — Z7952 Long term (current) use of systemic steroids: Secondary | ICD-10-CM | POA: Insufficient documentation

## 2024-01-28 DIAGNOSIS — N920 Excessive and frequent menstruation with regular cycle: Secondary | ICD-10-CM | POA: Diagnosis not present

## 2024-01-28 DIAGNOSIS — D696 Thrombocytopenia, unspecified: Secondary | ICD-10-CM | POA: Diagnosis not present

## 2024-01-28 DIAGNOSIS — D693 Immune thrombocytopenic purpura: Secondary | ICD-10-CM | POA: Diagnosis present

## 2024-01-28 DIAGNOSIS — D509 Iron deficiency anemia, unspecified: Secondary | ICD-10-CM

## 2024-01-28 LAB — CBC WITH DIFFERENTIAL (CANCER CENTER ONLY)
Abs Immature Granulocytes: 0.02 10*3/uL (ref 0.00–0.07)
Basophils Absolute: 0 10*3/uL (ref 0.0–0.1)
Basophils Relative: 0 %
Eosinophils Absolute: 0.1 10*3/uL (ref 0.0–0.5)
Eosinophils Relative: 2 %
HCT: 36.4 % (ref 36.0–46.0)
Hemoglobin: 11.3 g/dL — ABNORMAL LOW (ref 12.0–15.0)
Immature Granulocytes: 0 %
Lymphocytes Relative: 23 %
Lymphs Abs: 1.6 10*3/uL (ref 0.7–4.0)
MCH: 23.6 pg — ABNORMAL LOW (ref 26.0–34.0)
MCHC: 31 g/dL (ref 30.0–36.0)
MCV: 76 fL — ABNORMAL LOW (ref 80.0–100.0)
Monocytes Absolute: 0.4 10*3/uL (ref 0.1–1.0)
Monocytes Relative: 6 %
Neutro Abs: 4.9 10*3/uL (ref 1.7–7.7)
Neutrophils Relative %: 69 %
Platelet Count: 96 10*3/uL — ABNORMAL LOW (ref 150–400)
RBC: 4.79 MIL/uL (ref 3.87–5.11)
RDW: 15.4 % (ref 11.5–15.5)
WBC Count: 7.1 10*3/uL (ref 4.0–10.5)
nRBC: 0 % (ref 0.0–0.2)

## 2024-01-28 MED ORDER — ELTROMBOPAG OLAMINE 75 MG PO TABS
75.0000 mg | ORAL_TABLET | Freq: Every day | ORAL | 2 refills | Status: DC
  Filled 2024-01-28 – 2024-02-03 (×2): qty 30, 30d supply, fill #0
  Filled 2024-03-01: qty 30, 30d supply, fill #1
  Filled 2024-04-14: qty 30, 30d supply, fill #2

## 2024-01-28 NOTE — Progress Notes (Signed)
Patient Care Team: [name], NP as PCP - General (Adult Health Nurse Practitioner)   CHIEF COMPLAINT: Follow-up ITP and IDA  CURRENT THERAPY: Promacta 75 mg p.o. once daily and oral iron 2 tabs once daily  INTERVAL HISTORY Ms. Sanko returns for follow-up, last seen by Lonna Cobb, NP 01/08/2024.  She has been taking Promacta consistently since then without any missed doses.  She sets an alarm.  Also taking iron 2 tabs once daily, tolerates well with mild nausea, no constipation.  Denies any bleeding except monthly menses.  Denies recent/acute infection or upcoming surgery  ROS  All other systems reviewed and negative  Past Medical History:  Diagnosis Date   Depression      Past Surgical History:  Procedure Laterality Date   CYST EXCISION     pilonidinal   FOOT FOREIGN BODY REMOVAL  08/2016   FOREIGN BODY REMOVAL Left 08/15/2016   Procedure: REMOVAL FOREIGN BODY LEFT FOOT;  Surgeon: Myrene Galas, MD;  Location: [facility] OR;  Service: Orthopedics;  Laterality: Left;   WISDOM TOOTH EXTRACTION       Outpatient Encounter Medications as of 01/28/2024  Medication Sig   Ferrous Sulfate (IRON PO) Take 1 tablet by mouth daily.   [DISCONTINUED] eltrombopag (PROMACTA) 75 MG tablet Take 1 tablet (75 mg total) by mouth daily. Take on an empty stomach 1 hour before meals or 2 hours after.   eltrombopag (PROMACTA) 75 MG tablet Take 1 tablet (75 mg total) by mouth daily. Take on an empty stomach 1 hour before meals or 2 hours after.   No facility-administered encounter medications on file as of 01/28/2024.     Today's Vitals   01/28/24 0850 01/28/24 0854  BP: 121/78   Pulse: 100   Resp: 18   Temp: 98.2 F (36.8 C)   TempSrc: Temporal   SpO2: 99%   Weight: 261 lb 4.8 oz (118.5 kg)   Height: 5\' 5"  (1.651 m)   PainSc:  0-No pain   Body mass index is 43.48 kg/m.   PHYSICAL EXAM GENERAL:alert, no distress and comfortable SKIN: no rash  EYES: sclera clear NECK: without  mass LYMPH:  no palpable cervical lymphadenopathy  LUNGS: clear with normal breathing effort HEART: regular rate & rhythm, no lower extremity edema ABDOMEN: abdomen soft, non-tender and normal bowel sounds.  No splenomegaly NEURO: alert & oriented x 3 with fluent speech, no focal motor/sensory deficits   CBC    Component Value Date/Time   WBC 7.1 01/28/2024 0840   WBC 19.6 (H) 07/27/2022 0445   RBC 4.79 01/28/2024 0840   HGB 11.3 (L) 01/28/2024 0840   HGB 13.7 08/08/2020 1618   HCT 36.4 01/28/2024 0840   HCT 41.4 08/08/2020 1618   PLT 96 (L) 01/28/2024 0840   PLT 268 05/25/2020 1643   MCV 76.0 (L) 01/28/2024 0840   MCV 86 08/08/2020 1618   MCH 23.6 (L) 01/28/2024 0840   MCHC 31.0 01/28/2024 0840   RDW 15.4 01/28/2024 0840   RDW 13.3 08/08/2020 1618   LYMPHSABS 1.6 01/28/2024 0840   LYMPHSABS 2.7 08/08/2020 1618   MONOABS 0.4 01/28/2024 0840   EOSABS 0.1 01/28/2024 0840   EOSABS 0.1 08/08/2020 1618   BASOSABS 0.0 01/28/2024 0840   BASOSABS 0.0 08/08/2020 1618     CMP     Component Value Date/Time   NA 136 12/11/2023 1402   NA 135 05/25/2020 1643   K 3.7 12/11/2023 1402   CL 106 12/11/2023 1402  CO2 22 12/11/2023 1402   GLUCOSE 84 12/11/2023 1402   BUN 14 12/11/2023 1402   BUN 8 05/25/2020 1643   CREATININE 0.76 12/11/2023 1402   CALCIUM 8.8 (L) 12/11/2023 1402   PROT 7.2 12/11/2023 1402   PROT 7.3 05/25/2020 1643   ALBUMIN 3.9 12/11/2023 1402   ALBUMIN 4.5 05/25/2020 1643   AST 13 (L) 12/11/2023 1402   ALT 12 12/11/2023 1402   ALKPHOS 88 12/11/2023 1402   BILITOT 0.4 12/11/2023 1402   GFRNONAA >60 12/11/2023 1402   GFRAA 101 05/25/2020 1643     ASSESSMENT & PLAN: 28 yo female  ITP presenting with platelet count 19,000 07/25/2022 Solu-Medrol followed by prednisone Relapse September 2023 with prednisone taper to 10 mg Rituximab 09/19/2022-4 weekly doses Prednisone taper following rituximab Prednisone taper to 5 mg daily 11/04/2022 Recurrent  thrombocytopenia 12/19/2022, prednisone increased to 40 mg daily 12/26/2022 Prednisone decreased to 30 mg daily 01/16/2023 Prednisone decreased to 20 mg daily 02/23/2023 Prednisone decreased to 15 mg daily 03/20/2023 Prednisone decreased to 10 mg daily beginning 04/27/2023 Prednisone increased to 20 mg daily 05/15/2023 Promacta started 05/20/2023 Prednisone taper to 15 mg daily for 4 days, then 10 mg daily Platelet count 39,000 06/26/2023 Promacta dose increased to 75 mg daily (first dose 07/01/2023) Prednisone discontinued per patient late July 2024   2.  History of anemia secondary to menorrhagia and iron deficiency-recurrent iron deficiency anemia August 2024 On Oral iron 2 tabs once daily, tolerating well with mild nausea Ferritin improved from 7 to 14 on 2 tabs, continue   Disposition:  Ms. Schou appears stable, no abnormal bleeding. She continues promacta 75 mg, tolerating well and more compliant. The platelet count has improved to 96K, continue same dose, I refilled.   Recent ferritin improved on oral iron 2 tabs daily, I encouraged her to take with vit C source for enhanced absorption.   She will return for lab (CBC, ferritin, CMP) in 3 weeks, then CBC and f/up in 6 weeks.   She knows to call sooner with bleeding, acute infection, or and other new/worsening concerns.   The case was reviewed with Dr. Truett Perna.  Orders Placed This Encounter  Procedures   CBC with Differential (Cancer Center Only)    Standing Status:   Future    Expected Date:   02/18/2024    Expiration Date:   01/27/2025   CMP (Cancer Center only)    Standing Status:   Future    Expected Date:   02/18/2024    Expiration Date:   01/27/2025   Ferritin    Standing Status:   Future    Expected Date:   02/18/2024    Expiration Date:   01/27/2025   CBC with Differential (Cancer Center Only)    Standing Status:   Future    Expected Date:   03/10/2024    Expiration Date:   01/27/2025      All questions were answered. The  patient knows to call the clinic with any problems, questions or concerns. No barriers to learning were detected.   Santiago Glad, NP-C 01/28/2024

## 2024-01-29 ENCOUNTER — Other Ambulatory Visit: Payer: BC Managed Care – PPO

## 2024-01-29 ENCOUNTER — Ambulatory Visit: Payer: BC Managed Care – PPO | Admitting: Oncology

## 2024-02-01 ENCOUNTER — Other Ambulatory Visit: Payer: Self-pay

## 2024-02-03 ENCOUNTER — Other Ambulatory Visit: Payer: Self-pay

## 2024-02-03 NOTE — Progress Notes (Signed)
Specialty Pharmacy Refill Coordination Note  Daisy Meyers is a 28 y.o. female contacted today regarding refills of specialty medication(s) Eltrombopag Olamine [name]   Patient requested Pickup at [facility] at [facility] date: 02/09/24   Medication will be filled on 02/08/2024.

## 2024-02-08 ENCOUNTER — Other Ambulatory Visit: Payer: Self-pay

## 2024-02-18 ENCOUNTER — Other Ambulatory Visit (HOSPITAL_BASED_OUTPATIENT_CLINIC_OR_DEPARTMENT_OTHER): Payer: Self-pay

## 2024-02-18 ENCOUNTER — Ambulatory Visit (INDEPENDENT_AMBULATORY_CARE_PROVIDER_SITE_OTHER): Payer: BC Managed Care – PPO | Admitting: Otolaryngology

## 2024-02-18 ENCOUNTER — Other Ambulatory Visit (HOSPITAL_COMMUNITY): Payer: Self-pay

## 2024-02-18 ENCOUNTER — Encounter: Payer: Self-pay | Admitting: Oncology

## 2024-02-18 ENCOUNTER — Other Ambulatory Visit: Payer: Self-pay

## 2024-02-18 ENCOUNTER — Encounter (INDEPENDENT_AMBULATORY_CARE_PROVIDER_SITE_OTHER): Payer: Self-pay | Admitting: Otolaryngology

## 2024-02-18 VITALS — BP 125/82 | HR 117 | Ht 64.0 in | Wt 250.0 lb

## 2024-02-18 DIAGNOSIS — J342 Deviated nasal septum: Secondary | ICD-10-CM | POA: Diagnosis not present

## 2024-02-18 DIAGNOSIS — R490 Dysphonia: Secondary | ICD-10-CM | POA: Diagnosis not present

## 2024-02-18 DIAGNOSIS — J343 Hypertrophy of nasal turbinates: Secondary | ICD-10-CM

## 2024-02-18 DIAGNOSIS — J382 Nodules of vocal cords: Secondary | ICD-10-CM | POA: Diagnosis not present

## 2024-02-18 DIAGNOSIS — K219 Gastro-esophageal reflux disease without esophagitis: Secondary | ICD-10-CM | POA: Diagnosis not present

## 2024-02-18 DIAGNOSIS — J351 Hypertrophy of tonsils: Secondary | ICD-10-CM | POA: Diagnosis not present

## 2024-02-18 DIAGNOSIS — R0981 Nasal congestion: Secondary | ICD-10-CM | POA: Diagnosis not present

## 2024-02-18 DIAGNOSIS — J3089 Other allergic rhinitis: Secondary | ICD-10-CM

## 2024-02-18 MED ORDER — METHYLPREDNISOLONE 4 MG PO TBPK
ORAL_TABLET | ORAL | 1 refills | Status: DC
  Filled 2024-02-18: qty 21, 6d supply, fill #0

## 2024-02-18 MED ORDER — FAMOTIDINE 20 MG PO TABS
20.0000 mg | ORAL_TABLET | Freq: Two times a day (BID) | ORAL | 3 refills | Status: DC
  Filled 2024-02-18: qty 30, 15d supply, fill #0

## 2024-02-18 MED ORDER — SULFAMETHOXAZOLE-TRIMETHOPRIM 800-160 MG PO TABS
1.0000 | ORAL_TABLET | Freq: Two times a day (BID) | ORAL | 0 refills | Status: DC
  Filled 2024-02-18: qty 28, 14d supply, fill #0

## 2024-02-18 NOTE — Progress Notes (Signed)
 ENT CONSULT:  Reason for Consult: hoarseness  x 8 months  HPI: Discussed the use of AI scribe software for clinical note transcription with the patient, who gave verbal consent to proceed.  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 She was referred by her oncologist for evaluation of hoarseness after starting the medication.  She experiences hoarseness and a raspy voice that fluctuates after starting a new medication. Her voice remains hoarse and raspy without a specific pattern throughout the day. No pain when talking, changes in swallowing, or shortness of breath. Denies postnasal drainage, but has some nasal congestion. No prior allergy testing. Since the onset of symptoms, her voice has slightly worsened but not drastically.  She occasionally experiences acid reflux but has no official diagnosis of heartburn or reflux. No significant change in her voice throughout the day related to reflux symptoms.  Her current medications include an iron supplement and Promacta. She was previously on prednisone but has been tapered off. She is not using any inhalers and has no history of smoking or secondhand smoke exposure.  She works as a Adult nurse, which involves significant voice use. She does not have children but has two pets.         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  History of anemia secondary to menorrhagia and iron deficiency-recurrent iron deficiency anemia August 2024 On Oral iron 2 tabs once daily, tolerating well with mild nausea Ferritin improved from 7 to 14 on 2 tabs, continue     Disposition:  [name] appears stable, no abnormal bleeding. She continues promacta 75 mg, tolerating well and more compliant. The platelet count has improved to 96K, continue same dose, I refilled.    Recent ferritin improved on oral iron 2 tabs daily, I encouraged her to take with vit C source for enhanced absorption.    She will return for lab (CBC, ferritin, CMP) in 3 weeks, then CBC and f/up in 6 weeks.   Office visit Oncology 12/11/23 Disposition: [name] appears stable.  She continues Promacta.  We reviewed the CBC from today.  Platelet count in an adequate range at 69,000.  She has progressive anemia.  Indices consistent with iron deficiency.  We will follow-up on the ferritin.  She will resume oral iron.  If she is unable to tolerate oral iron we can consider IV iron.  She agrees with this plan.   She has noted a change in her voice since beginning Promacta.  [facility] pharmac[facility].  Unlikely the voice issue is related to Promacta.  She would like to have  this investigated further.  Referral placed to ENT.   She plans to follow-up with orthopedics for evaluation of the low back/sacral pain.    Past Medical History:  Diagnosis Date   Depression     Past Surgical History:  Procedure Laterality Date   CYST EXCISION     pilonidinal    FOOT FOREIGN BODY REMOVAL  08/2016   FOREIGN BODY REMOVAL Left 08/15/2016   Procedure: REMOVAL FOREIGN BODY LEFT FOOT;  Surgeon: Myrene Galas, MD;  Location: [facility] OR;  Service: Orthopedics;  Laterality: Left;   WISDOM TOOTH EXTRACTION      Family History  Problem Relation Age of Onset   Heart disease Paternal Grandfather    Diabetes Maternal Aunt     Social History:  reports that she has never smoked. She has never used smokeless tobacco. She reports current alcohol use. She reports that she does not use drugs.  Allergies:  Allergies  Allergen Reactions   Rituxan [Rituximab] Anaphylaxis    Hypersensitivity Reaction:  See progress note: 09/19/2022    No Known Allergies     Medications: I have reviewed the patient's current medications.  The PMH, PSH, Medications, Allergies, and SH were reviewed and updated.  ROS: Constitutional: Negative for fever, weight loss and weight gain. Cardiovascular: Negative for chest pain and dyspnea on exertion. Respiratory: Is not experiencing shortness of breath at rest. Gastrointestinal: Negative for nausea and vomiting. Neurological: Negative for headaches. Psychiatric: The patient is not nervous/anxious  Blood pressure 125/82, pulse (!) 117, height 5\' 4"  (1.626 m), weight 250 lb (113.4 kg), SpO2 97%. Body mass index is 42.91 kg/m.  PHYSICAL EXAM:  Exam: General: Well-developed, well-nourished Communication and Voice: slightly raspy Respiratory Respiratory effort: Equal inspiration and expiration without stridor Cardiovascular Peripheral Vascular: Warm extremities with equal color/perfusion Eyes: No nystagmus with equal extraocular motion bilaterally Neuro/Psych/Balance: Patient oriented to person, place, and time; Appropriate mood and affect; Gait is intact with no imbalance; Cranial nerves I-XII are intact Head and Face Inspection: Normocephalic and atraumatic without mass or lesion Palpation: Facial skeleton intact without bony  stepoffs Salivary Glands: No mass or tenderness Facial Strength: Facial motility symmetric and full bilaterally ENT Pinna: External ear intact and fully developed External canal: Canal is patent with intact skin Tympanic Membrane: Clear and mobile External Nose: No scar or anatomic deformity Internal Nose: Septum is deviated to the left. No polyp, or purulence. Mucosal edema and erythema present.  Bilateral inferior turbinate hypertrophy.  Lips, Teeth, and gums: Mucosa and teeth intact and viable TMJ: No pain to palpation with full mobility Oral cavity/oropharynx: No erythema or exudate, no lesions present. 2+ tonsils  Nasopharynx: No mass or lesion with intact mucosa Hypopharynx: Intact mucosa without pooling of secretions Larynx Glottic: Full true vocal cord mobility with thickening mid 1/3 of b/l VF along the edges, c/w VF nodules Supraglottic: Normal appearing epiglottis and AE folds Interarytenoid Space: Moderate pachydermia&edema Subglottic Space: Patent without lesion or edema Neck Neck and Trachea: Midline trachea without mass or lesion Thyroid: No mass or nodularity Lymphatics: No lymphadenopathy  Procedure: Preoperative diagnosis: dysphonia   Postoperative diagnosis:   Same + VF nodules + GERD LPR  Procedure: Flexible fiberoptic laryngoscopy  Surgeon: Ashok Croon, MD  Anesthesia: Topical lidocaine and Afrin Complications: None Condition is stable throughout exam  Indications and consent:  The patient presents to the clinic with above symptoms. Indirect laryngoscopy view was incomplete. Thus it was recommended that they undergo a flexible fiberoptic laryngoscopy. All of the risks, benefits,  and potential complications were reviewed with the patient preoperatively and verbal informed consent was obtained.  Procedure: The patient was seated upright in the clinic. Topical lidocaine and Afrin were applied to the nasal cavity. After adequate anesthesia had occurred, I  then proceeded to pass the flexible telescope into the nasal cavity. The nasal cavity was patent without rhinorrhea or polyp. The nasopharynx was also patent without mass or lesion. The base of tongue was visualized and was normal. There were no signs of pooling of secretions in the piriform sinuses. The true vocal folds were mobile bilaterally. There were no signs of supraglottic mucosal lesion or mass. B/l VF were with VF nodules. There was moderate interarytenoid pachydermia and post cricoid edema. The telescope was then slowly withdrawn and the patient tolerated the procedure throughout.    PROCEDURE NOTE: nasal endoscopy  Preoperative diagnosis: chronic nasal congestion symptoms  Postoperative diagnosis: same  Procedure: Diagnostic nasal endoscopy (16109)  Surgeon: Ashok Croon, M.D.  Anesthesia: Topical lidocaine and Afrin  H&P REVIEW: The patient's history and physical were reviewed today prior to procedure. All medications were reviewed and updated as well. Complications: None Condition is stable throughout exam Indications and consent: The patient presents with symptoms of chronic sinusitis not responding to previous therapies. All the risks, benefits, and potential complications were reviewed with the patient preoperatively and informed consent was obtained. The time out was completed with confirmation of the correct procedure.   Procedure: The patient was seated upright in the clinic. Topical lidocaine and Afrin were applied to the nasal cavity. After adequate anesthesia had occurred, the rigid nasal endoscope was passed into the nasal cavity. The nasal mucosa, turbinates, septum, and sinus drainage pathways were visualized bilaterally. This revealed no purulence or significant secretions that might be cultured. There were no polyps or sites of significant inflammation. The mucosa was intact and there was no crusting present. The scope was then slowly withdrawn and the patient  tolerated the procedure well. There were no complications or blood loss.   Studies Reviewed: Neck soft tissue U/S 08/15/20 FINDINGS: Targeted ultrasound of the left neck region of concern was performed. No soft tissue neck mass or cervical lymphadenopathy is identified in this location. No other finding is identified in this region to account for the reported palpable abnormality.   IMPRESSION: No soft tissue neck mass, cervical lymphadenopathy or other finding is identified in the left neck region of concern to account for the reported palpable abnormality. Contrast-enhanced neck CT should be considered for further evaluation.  Assessment/Plan: Encounter Diagnoses  Name Primary?   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 Examination revealed bilateral vocal cord nodules, likely due to phonotrauma from occupational voice use as a physical therapist. Nodules located in the mid portion of the vocal cords. Nodules not believed to be directly related to her medication but may be exacerbated by behaviors such as throat clearing or chronic cough/whispering or shouting, attempting to project in noisy settings. Explained that vocal cord nodules can worsen if not treated, potentially leading to significant thickening and impacting voice  quality, especially in singers. Emphasized the importance of compliance with voice therapy to prevent recurrence. Informed that most people heal with therapy and voice rest, but relapse is possible if causative behaviors continue. - Prescribe Medrol dose pack (6-day taper) - Prescribe Bactrim x 2  weeks  - Prescribe Pepcid 20 mg BID - Recommend 48 hours of complete voice rest during the weekend while starting Medrol pack/Bactrim/Pepcid - Advise avoiding whispering or shouting - Encourage frequent breaks and hydration with herbal tea and water while working - Refer to speech therapy  - Schedule follow-up in 3 months for re-evaluation and rescoping  GERD LPR Evidence of GERD LPR on flexible scope exam - Pepcid 20 mg BID  -  Reflux Gourmet after meals - diet and lifestyle changes to minimize GERD - Refer to [employer] for dietary and lifestyle modifications/reflux cook book  Chronic nasal Congestion and evidence of Septal deviation/ITH and Postnasal Drip on nasal endoscopy today Thick mucus in the back of the nasopharynx and cobblestoning along pharyngeal mucosa c/w likely post-nasal drip which could impact her sx, suggestive of postnasal drainage. No evidence of sinus infection. Symptoms may be related to dry air or undiagnosed allergies. - Provide suggestions for managing postnasal drip - Flonase 2 puffs b/l nares and nasal saline rinses, consider antihistamine if persists   Tonsillar hypertrophy No hx of chronic tonsillitis or OSA will observe for now   Follow-up - Schedule follow-up appointment in 3 months - strobe when she returns  - Ensure patient schedules speech therapy sessions.      Thank you for allowing me to participate in the care of this patient. Please do not hesitate to contact me with any questions or concerns.   Ashok Croon, MD Otolaryngology [facility] Phone: [phone] Fax: [phone]    02/18/2024, 9:21 AM

## 2024-02-18 NOTE — Patient Instructions (Signed)

## 2024-02-19 ENCOUNTER — Inpatient Hospital Stay: Payer: BC Managed Care – PPO | Attending: Oncology

## 2024-02-19 DIAGNOSIS — D500 Iron deficiency anemia secondary to blood loss (chronic): Secondary | ICD-10-CM | POA: Insufficient documentation

## 2024-02-19 DIAGNOSIS — N920 Excessive and frequent menstruation with regular cycle: Secondary | ICD-10-CM | POA: Diagnosis not present

## 2024-02-19 DIAGNOSIS — D693 Immune thrombocytopenic purpura: Secondary | ICD-10-CM | POA: Diagnosis present

## 2024-02-19 DIAGNOSIS — Z7952 Long term (current) use of systemic steroids: Secondary | ICD-10-CM | POA: Diagnosis not present

## 2024-02-19 DIAGNOSIS — Z79899 Other long term (current) drug therapy: Secondary | ICD-10-CM | POA: Insufficient documentation

## 2024-02-19 DIAGNOSIS — D696 Thrombocytopenia, unspecified: Secondary | ICD-10-CM

## 2024-02-19 LAB — CBC WITH DIFFERENTIAL (CANCER CENTER ONLY)
Abs Immature Granulocytes: 0.02 10*3/uL (ref 0.00–0.07)
Basophils Absolute: 0 10*3/uL (ref 0.0–0.1)
Basophils Relative: 0 %
Eosinophils Absolute: 0.1 10*3/uL (ref 0.0–0.5)
Eosinophils Relative: 1 %
HCT: 33.8 % — ABNORMAL LOW (ref 36.0–46.0)
Hemoglobin: 10.6 g/dL — ABNORMAL LOW (ref 12.0–15.0)
Immature Granulocytes: 0 %
Lymphocytes Relative: 26 %
Lymphs Abs: 2.1 10*3/uL (ref 0.7–4.0)
MCH: 23.8 pg — ABNORMAL LOW (ref 26.0–34.0)
MCHC: 31.4 g/dL (ref 30.0–36.0)
MCV: 76 fL — ABNORMAL LOW (ref 80.0–100.0)
Monocytes Absolute: 0.3 10*3/uL (ref 0.1–1.0)
Monocytes Relative: 4 %
Neutro Abs: 5.5 10*3/uL (ref 1.7–7.7)
Neutrophils Relative %: 69 %
Platelet Count: 34 10*3/uL — ABNORMAL LOW (ref 150–400)
RBC: 4.45 MIL/uL (ref 3.87–5.11)
RDW: 15 % (ref 11.5–15.5)
WBC Count: 8.1 10*3/uL (ref 4.0–10.5)
nRBC: 0 % (ref 0.0–0.2)

## 2024-02-19 LAB — CMP (CANCER CENTER ONLY)
ALT: 14 U/L (ref 0–44)
AST: 14 U/L — ABNORMAL LOW (ref 15–41)
Albumin: 4.4 g/dL (ref 3.5–5.0)
Alkaline Phosphatase: 81 U/L (ref 38–126)
Anion gap: 9 (ref 5–15)
BUN: 9 mg/dL (ref 6–20)
CO2: 24 mmol/L (ref 22–32)
Calcium: 8.8 mg/dL — ABNORMAL LOW (ref 8.9–10.3)
Chloride: 104 mmol/L (ref 98–111)
Creatinine: 0.72 mg/dL (ref 0.44–1.00)
GFR, Estimated: >60 mL/min (ref >60)
Glucose, Bld: 84 mg/dL (ref 70–99)
Potassium: 3.7 mmol/L (ref 3.5–5.1)
Sodium: 137 mmol/L (ref 135–145)
Total Bilirubin: 0.5 mg/dL (ref 0.0–1.2)
Total Protein: 7.4 g/dL (ref 6.5–8.1)

## 2024-02-19 LAB — FERRITIN: Ferritin: 6 ng/mL — ABNORMAL LOW (ref 11–307)

## 2024-02-23 ENCOUNTER — Encounter: Payer: Self-pay | Admitting: Nurse Practitioner

## 2024-02-24 ENCOUNTER — Other Ambulatory Visit (HOSPITAL_COMMUNITY): Payer: Self-pay

## 2024-02-24 NOTE — Telephone Encounter (Signed)
 Forwarded to MD.

## 2024-02-29 ENCOUNTER — Other Ambulatory Visit: Payer: Self-pay

## 2024-03-01 ENCOUNTER — Other Ambulatory Visit: Payer: Self-pay

## 2024-03-01 NOTE — Progress Notes (Signed)
 Specialty Pharmacy Refill Coordination Note  Daisy Meyers is a 28 y.o. female contacted today regarding refills of specialty medication(s) Eltrombopag Olamine [facility])   Patient requested (Patient-Rptd) Pickup at [facility] at [facility] date: (Patient-Rptd) 03/25/24   Medication will be filled on 03/24/24.

## 2024-03-08 ENCOUNTER — Other Ambulatory Visit: Payer: Self-pay

## 2024-03-08 ENCOUNTER — Other Ambulatory Visit (HOSPITAL_COMMUNITY): Payer: Self-pay

## 2024-03-08 ENCOUNTER — Encounter: Payer: Self-pay | Admitting: Oncology

## 2024-03-08 ENCOUNTER — Other Ambulatory Visit: Payer: Self-pay | Admitting: *Deleted

## 2024-03-08 ENCOUNTER — Inpatient Hospital Stay: Payer: BC Managed Care – PPO

## 2024-03-08 ENCOUNTER — Inpatient Hospital Stay: Payer: BC Managed Care – PPO | Admitting: Oncology

## 2024-03-08 VITALS — BP 115/85 | HR 97 | Temp 97.9°F | Resp 18 | Ht 64.0 in | Wt 265.8 lb

## 2024-03-08 DIAGNOSIS — D696 Thrombocytopenia, unspecified: Secondary | ICD-10-CM

## 2024-03-08 DIAGNOSIS — D693 Immune thrombocytopenic purpura: Secondary | ICD-10-CM | POA: Diagnosis not present

## 2024-03-08 LAB — CBC WITH DIFFERENTIAL (CANCER CENTER ONLY)
Abs Immature Granulocytes: 0.03 10*3/uL (ref 0.00–0.07)
Basophils Absolute: 0 10*3/uL (ref 0.0–0.1)
Basophils Relative: 0 %
Eosinophils Absolute: 0.2 10*3/uL (ref 0.0–0.5)
Eosinophils Relative: 2 %
HCT: 34.6 % — ABNORMAL LOW (ref 36.0–46.0)
Hemoglobin: 10.6 g/dL — ABNORMAL LOW (ref 12.0–15.0)
Immature Granulocytes: 0 %
Lymphocytes Relative: 25 %
Lymphs Abs: 2.1 10*3/uL (ref 0.7–4.0)
MCH: 23.3 pg — ABNORMAL LOW (ref 26.0–34.0)
MCHC: 30.6 g/dL (ref 30.0–36.0)
MCV: 76.2 fL — ABNORMAL LOW (ref 80.0–100.0)
Monocytes Absolute: 0.6 10*3/uL (ref 0.1–1.0)
Monocytes Relative: 7 %
Neutro Abs: 5.6 10*3/uL (ref 1.7–7.7)
Neutrophils Relative %: 66 %
Platelet Count: 67 10*3/uL — ABNORMAL LOW (ref 150–400)
RBC: 4.54 MIL/uL (ref 3.87–5.11)
RDW: 15 % (ref 11.5–15.5)
WBC Count: 8.5 10*3/uL (ref 4.0–10.5)
nRBC: 0 % (ref 0.0–0.2)

## 2024-03-08 MED ORDER — HEMOCYTE-F 324-1 MG PO TABS
1.0000 | ORAL_TABLET | Freq: Every day | ORAL | 3 refills | Status: AC
  Filled 2024-03-08: qty 30, 30d supply, fill #0

## 2024-03-08 NOTE — Progress Notes (Signed)
  [facility] OFFICE PROGRESS NOTE   Diagnosis: ITP, iron deficiency  INTERVAL HISTORY:   Daisy Meyers returns as scheduled.  She continues Promacta.  She takes iron every other day.  Iron causes nausea.  She is taking iron on an empty stomach. She has a monthly menstrual cycle.  No other bleeding. She completed a steroid Dosepak for sinus drainage last week.  Objective:  Vital signs in last 24 hours:  Blood pressure 115/85, pulse 97, temperature 97.9 F (36.6 C), temperature source Temporal, resp. rate 18, height 5\' 4"  (1.626 m), weight 265 lb 12.8 oz (120.6 kg), SpO2 100%.    HEENT: No thrush Resp: Lungs clear bilaterally Cardio: Regular rate and rhythm GI: No hepatosplenomegaly Vascular: No leg edema  Skin: No ecchymoses  Portacath/PICC-without erythema  Lab Results:  Lab Results  Component Value Date   WBC 8.5 03/08/2024   HGB 10.6 (L) 03/08/2024   HCT 34.6 (L) 03/08/2024   MCV 76.2 (L) 03/08/2024   PLT 67 (L) 03/08/2024   NEUTROABS 5.6 03/08/2024    CMP  Lab Results  Component Value Date   NA 137 02/19/2024   K 3.7 02/19/2024   CL 104 02/19/2024   CO2 24 02/19/2024   GLUCOSE 84 02/19/2024   BUN 9 02/19/2024   CREATININE 0.72 02/19/2024   CALCIUM 8.8 (L) 02/19/2024   PROT 7.4 02/19/2024   ALBUMIN 4.4 02/19/2024   AST 14 (L) 02/19/2024   ALT 14 02/19/2024   ALKPHOS 81 02/19/2024   BILITOT 0.5 02/19/2024   GFRNONAA >60 02/19/2024   GFRAA 101 05/25/2020    No results found for: "CEA1", "CEA", "CAN199", "CA125"  Lab Results  Component Value Date   INR 1.1 07/25/2022   LABPROT 13.6 07/25/2022    Imaging:  No results found.  Medications: I have reviewed the patient's current medications.   Assessment/Plan: ITP presenting with platelet count 19,000 07/25/2022 Solu-Medrol followed by prednisone Relapse September 2023 with prednisone taper to 10 mg Rituximab 09/19/2022-4 weekly doses Prednisone taper following  rituximab Prednisone taper to 5 mg daily 11/04/2022 Recurrent thrombocytopenia 12/19/2022, prednisone increased to 40 mg daily 12/26/2022 Prednisone decreased to 30 mg daily 01/16/2023 Prednisone decreased to 20 mg daily 02/23/2023 Prednisone decreased to 15 mg daily 03/20/2023 Prednisone decreased to 10 mg daily beginning 04/27/2023 Prednisone increased to 20 mg daily 05/15/2023 Promacta started 05/20/2023 Prednisone taper to 15 mg daily for 4 days, then 10 mg daily Platelet count 39,000 06/26/2023 Promacta dose increased to 75 mg daily (first dose 07/01/2023) Prednisone discontinued per patient late July 2024     2.  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  She has been maintained on Promacta since June 2024.  The platelet count is mildly low at present.  The increased platelet count today may reflect the recent steroid Dosepak. She will continue Promacta.  She will return for a CBC 03/25/2024 and an office visit on 04/15/2024.  The plan is to begin a trial of pulsed Decadron if the platelet count remains at less than 50,000.  She has iron deficiency anemia.  Ferrous sulfate causes nausea.  She will begin a trial of Hemocyte.  She will contact us if the Hemocyte causes nausea.  We will consider another oral iron preparation versus IV iron.  Daisy Papas, MD  03/08/2024  8:41 AM

## 2024-03-09 ENCOUNTER — Other Ambulatory Visit: Payer: Self-pay

## 2024-03-18 ENCOUNTER — Other Ambulatory Visit (HOSPITAL_COMMUNITY): Payer: Self-pay

## 2024-03-21 NOTE — Therapy (Unsigned)
 OUTPATIENT SPEECH LANGUAGE PATHOLOGY VOICE EVALUATION   Patient Name: Daisy Meyers MRN: [id number] DOB:December 20, 1995, 28 y.o., female Today's Date: 03/22/2024  PCP: Daisy Agee, NP REFERRING PROVIDER: Ashok Croon, MD  END OF SESSION:  End of Session - 03/22/24 1110     Visit Number 1    Number of Visits 9    Date for SLP Re-Evaluation 05/31/24   extended for scheduling   Authorization Type BCBS    SLP Start Time 0803    SLP Stop Time  0845    SLP Time Calculation (min) 42 min    Activity Tolerance Patient tolerated treatment well             Past Medical History:  Diagnosis Date   Depression    Past Surgical History:  Procedure Laterality Date   CYST EXCISION     pilonidinal   FOOT FOREIGN BODY REMOVAL  08/2016   FOREIGN BODY REMOVAL Left 08/15/2016   Procedure: REMOVAL FOREIGN BODY LEFT FOOT;  Surgeon: Daisy Galas, MD;  Location: [facility] OR;  Service: Orthopedics;  Laterality: Left;   WISDOM TOOTH EXTRACTION     Patient Active Problem List   Diagnosis Date Noted   Menorrhagia 07/26/2022   Leukocytosis 07/26/2022   Severe anemia 07/25/2022   Abnormal vaginal bleeding 07/25/2022   Thrombocytopenia (HCC) 07/25/2022    Onset date: 02/18/2024 (referral date)  REFERRING DIAG: R49.0 (ICD-10-CM) - Dysphonia R49.0 (ICD-10-CM) - Hoarseness J38.2 (ICD-10-CM) - 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 She was referred by her oncologist for evaluation of hoarseness after starting the medication.   She experiences hoarseness and a raspy voice that fluctuates after starting a new medication. Her voice remains hoarse and raspy  without a specific pattern throughout the day. No pain when talking, changes in swallowing, or shortness of breath. Denies postnasal drainage, but has some nasal congestion. No prior allergy testing. Since the onset of symptoms, her voice has slightly worsened but not drastically.   She occasionally experiences acid reflux but has no official diagnosis of heartburn or reflux. No significant change in her voice throughout the day related to reflux symptoms.   Her current medications include an iron supplement and Promacta. She was previously on prednisone but has been tapered off. She is not using any inhalers and has no history of smoking or secondhand smoke exposure.   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 No  FALLS: Has patient fallen in last 6 months? No  LIVING ENVIRONMENT: Lives with: lives with their family Lives in: House/apartment  PLOF:Level of assistance: Independent with ADLs, Independent with IADLs Employment: Full-time employment  PATIENT GOALS: "sound better. For people to not notice it's raspy"  OBJECTIVE:  Note: Objective measures were completed at Evaluation unless otherwise noted.  DIAGNOSTIC FINDINGS: Flexible fiberoptic laryngoscopy:  The nasal cavity was patent without rhinorrhea or polyp. The nasopharynx was also patent without mass or lesion. The base of tongue was visualized and was normal. There were no signs of pooling of secretions in the piriform sinuses. The true vocal folds were mobile bilaterally. There were  no signs of supraglottic mucosal lesion or mass. B/l VF were with VF nodules. There was moderate interarytenoid pachydermia and post cricoid edema.   COGNITION: Overall cognitive status: Within functional limits for tasks assessed  PHYSICAL SYMPTOMS: Do you have any burning, soreness, tickling, or irritation in your throat? No Do you  sometimes have a sensation of a lump in your throat?  No Do you have any aching or tightness in your throat? No Do you ever feel tension in your neck area? Yes Does your voice get tired easily? No Do you feel as if you have to strain to produce voice? No Do you feel as if you need to cough or clear your throat a lot? Sometimes - has been trying to reduce throat clearing with success  Do you ever lose your voice completely? No Do you ever have difficulty swallowing? No Do you have difficulty projecting your voice? Yes - always d/t baseline low vocal intensity  VOCAL ABUSE:  Episodes observed during evaluation: throat clearing x1 Pt report of frequency: sometimes but decreased d/t increased awareness s/p ENT Identified triggers: Acid reflux  and Voice overuse  SOCIAL HISTORY: Occupation: Physical Warehouse manager intake:  moderate Caffeine/alcohol intake: minimal (1 cup of coffee) Daily voice use: moderate and excessive   PERCEPTUAL VOICE ASSESSMENT: Voice quality: hoarse and breathy Vocal abuse: habitual throat clearing Resonance: normal Respiratory function: thoracic breathing and clavicular breathing  OBJECTIVE VOICE ASSESSMENT: Maximum phonation time for sustained "ah": 14 seconds  Conversational loudness average: 70 dB Conversational loudness range: 67-72 dB S/z ratio: 12 seconds/11 seconds (Suggestive of dysfunction >1.0)  PATIENT REPORTED OUTCOME MEASURES (PROM): V-RQOL: 17  & RSI: 7                                                                                                                            TREATMENT DATE:  03/22/24: Evaluation and POC complete. Initiated education and instruction of behavioral management of acid reflux, throat clear alternatives, and SOVTE. Pt able to complete with rare min A for increased forward resonance. Handouts provided.   PATIENT EDUCATION: Education details: see above Person educated: Patient Education method: Psychologist, counselling Education comprehension: verbalized understanding and needs further education  HOME EXERCISE PROGRAM: Vocal hygiene, SOVTE, resonant   GOALS: Goals reviewed with patient? Yes  SHORT TERM GOALS: Target date: 04/19/2024  Pt will complete daily HEP x2 sessions Baseline: Goal status: INITIAL  2.  Pt will demonstrate targeted voice exercises with rare min A x2 sessions Baseline:  Goal status: INITIAL  3.  Pt will carryover improved vocal quality on structured task given rare min A  Baseline:  Goal status: INITIAL  4.  Pt will carryover improved vocal quality in structured conversation give rare min A  Baseline:  Goal status: INITIAL  LONG TERM GOALS: Target date: 05/31/2024  Pt will complete HEP > 1 week  Baseline:  Goal status: INITIAL  2.  Pt will independently suppress throat clears  with none evidenced across 2 sessions  Baseline:  Goal status: INITIAL  3.  Pt will carryover improved vocal quality in unstructured conversations x2 give rare min A  Baseline:  Goal status: INITIAL  4.  Pt will report improved voice quality via PROM by 2 points by LTG date Baseline: VRQOL=17 Goal status: INITIAL   ASSESSMENT:  CLINICAL IMPRESSION: Patient is a 28 y.o. 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 Today, pt presents with mild raspy/hoarse vocal quality. Endorsed voice changes started last August when new medication initiated. ENT suspects nodules related to phonotrauma from occupation (PT) and possibly GERD. Increasingly aware of throat clearing, which has decreased with only one throat clear evidenced today. Skilled ST intervention warranted to optimize vocal hygiene and efficient voicing given presence of nodules.   OBJECTIVE IMPAIRMENTS: include voice disorder. These impairments are limiting patient from effectively communicating at home and in community. Factors affecting potential to achieve goals and functional outcome are previous  level of function. Patient will benefit from skilled SLP services to address above impairments and improve overall function.  REHAB POTENTIAL: Good  PLAN:  SLP FREQUENCY: 1-2x/week  SLP DURATION: 10 weeks (extended for scheduling)  PLANNED INTERVENTIONS: Environmental controls, Cueing hierachy, Functional tasks, SLP instruction and feedback, Compensatory strategies, Patient/family education, [phone] Treatment of speech (30 or 45 min) , and [id number]- Speech Eval Behavioral Qualitative Voice Resonance    Gracy Racer, CCC-SLP 03/22/2024, 11:23 AM

## 2024-03-22 ENCOUNTER — Ambulatory Visit: Attending: Otolaryngology

## 2024-03-22 DIAGNOSIS — J382 Nodules of vocal cords: Secondary | ICD-10-CM | POA: Diagnosis not present

## 2024-03-22 DIAGNOSIS — R490 Dysphonia: Secondary | ICD-10-CM | POA: Insufficient documentation

## 2024-03-22 NOTE — Patient Instructions (Signed)
 What to Do Instead of Clearing Your Throat Use these strategies instead of clearing your throat. 1. Swallow your saliva. Swallow as hard as you can.  2. Take a drink of water. 3. Suck on ice chips. 4. Use a silent cough. Whisper the word "huh" from your belly without making a sound and then swallow. This is like a cough but without using your voice. 5. Hum on an "M" and then swallow. 6. Use a light, gentle cough (like tapping your vocal folds together) and then swallow. 7. Silently count to 10 and then swallow   ACID REFLUX can be a possible cause of a voice disorder or throat irritation. Acid reflux is a disorder where acid from your stomach is abnormally spilled over onto your voice box after eating, during sleep, or even during singing. Acid reflux causes irritation and inflammation to your vocal folds and should be avoided and treated by changing eating habits, changing lifestyle, and taking medication (if prescribed by your doctor).   CHANGE EATING HABITS   Avoid "trigger" foods. Certain foods and drinks can trigger acid reflux.  1. Caffeine- in coffee, tea, chocolate, sodas  2. Carbonated beverages  3. Mint and menthol  4. Fatty/fried foods  5. Citrus fruits  6. Tomato products  7. Spicy foods  8. Alcohol    CHANGING LIFESTYLE HABITS   Drink 8 glasses of water per day (64oz)    Stop smoking   Avoid clearing your throat   Allow 3 hours between last big meal and going to bed at night   Keep yourself upright for one hour after you eat   Elevate the head of your bed using 6-inch blocks under the head of the bed or a bed wedge between the box spring and the mattress.   Eat small meals throughout the day rather than 3 big meals   Eat slowly   Wear loose clothing   TAKING MEDICATION   If prescribed one time a day, take 15-30 minutes before breakfast   If prescribed two times a day, take 15-30 minutes before breakfast and 15- 30 minutes before dinner   CHANGING THE WAY YOU USE  YOUR VOICE  "Best voice/ Least effort"

## 2024-03-24 ENCOUNTER — Other Ambulatory Visit: Payer: Self-pay

## 2024-03-25 ENCOUNTER — Inpatient Hospital Stay: Attending: Oncology

## 2024-03-25 DIAGNOSIS — D696 Thrombocytopenia, unspecified: Secondary | ICD-10-CM

## 2024-03-25 DIAGNOSIS — D693 Immune thrombocytopenic purpura: Secondary | ICD-10-CM | POA: Insufficient documentation

## 2024-03-25 DIAGNOSIS — D500 Iron deficiency anemia secondary to blood loss (chronic): Secondary | ICD-10-CM | POA: Insufficient documentation

## 2024-03-25 DIAGNOSIS — N920 Excessive and frequent menstruation with regular cycle: Secondary | ICD-10-CM | POA: Diagnosis not present

## 2024-03-25 LAB — CBC WITH DIFFERENTIAL (CANCER CENTER ONLY)
Abs Immature Granulocytes: 0.02 10*3/uL (ref 0.00–0.07)
Basophils Absolute: 0 10*3/uL (ref 0.0–0.1)
Basophils Relative: 0 %
Eosinophils Absolute: 0.1 10*3/uL (ref 0.0–0.5)
Eosinophils Relative: 1 %
HCT: 32.7 % — ABNORMAL LOW (ref 36.0–46.0)
Hemoglobin: 10.4 g/dL — ABNORMAL LOW (ref 12.0–15.0)
Immature Granulocytes: 0 %
Lymphocytes Relative: 28 %
Lymphs Abs: 2.3 10*3/uL (ref 0.7–4.0)
MCH: 24.4 pg — ABNORMAL LOW (ref 26.0–34.0)
MCHC: 31.8 g/dL (ref 30.0–36.0)
MCV: 76.8 fL — ABNORMAL LOW (ref 80.0–100.0)
Monocytes Absolute: 0.4 10*3/uL (ref 0.1–1.0)
Monocytes Relative: 5 %
Neutro Abs: 5.3 10*3/uL (ref 1.7–7.7)
Neutrophils Relative %: 66 %
Platelet Count: 61 10*3/uL — ABNORMAL LOW (ref 150–400)
RBC: 4.26 MIL/uL (ref 3.87–5.11)
RDW: 16.2 % — ABNORMAL HIGH (ref 11.5–15.5)
WBC Count: 8.2 10*3/uL (ref 4.0–10.5)
nRBC: 0 % (ref 0.0–0.2)

## 2024-03-28 ENCOUNTER — Encounter: Payer: Self-pay | Admitting: *Deleted

## 2024-04-08 NOTE — Therapy (Signed)
 OUTPATIENT SPEECH LANGUAGE PATHOLOGY TREATMENT NOTE   Patient Name: Daisy Meyers MRN: [id number] DOB:[birth year], 28 y.o., female Today's Date: 04/14/2024  PCP: Ulyess Gammons, NP REFERRING PROVIDER: Artice Last, MD  END OF SESSION:  End of Session - 04/14/24 0811     Visit Number 2    Number of Visits 9    Date for SLP Re-Evaluation 05/31/24   extended for scheduling   Authorization Type BCBS    SLP Start Time [phone]   pt arrived late   SLP Stop Time  0845    SLP Time Calculation (min) 34 min    Activity Tolerance Patient tolerated treatment well              Past Medical History:  Diagnosis Date   Depression    Past Surgical History:  Procedure Laterality Date   CYST EXCISION     pilonidinal   FOOT FOREIGN BODY REMOVAL  08/2016   FOREIGN BODY REMOVAL Left 08/15/2016   Procedure: REMOVAL FOREIGN BODY LEFT FOOT;  Surgeon: Hardy Lia, MD;  Location: [facility] OR;  Service: Orthopedics;  Laterality: Left;   WISDOM TOOTH EXTRACTION     Patient Active Problem List   Diagnosis Date Noted   Menorrhagia 07/26/2022   Leukocytosis 07/26/2022   Severe anemia 07/25/2022   Abnormal vaginal bleeding 07/25/2022   Thrombocytopenia (HCC) 07/25/2022    Onset date: 02/18/2024 (referral date)  REFERRING DIAG: R49.0 (ICD-10-CM) - Dysphonia R49.0 (ICD-10-CM) - Hoarseness J38.2 (ICD-10-CM) - 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 She was referred by her oncologist for evaluation of hoarseness after starting the medication.   She experiences hoarseness and a raspy voice that fluctuates  after starting a new medication. Her voice remains hoarse and raspy without a specific pattern throughout the day. No pain when talking, changes in swallowing, or shortness of breath. Denies postnasal drainage, but has some nasal congestion. No prior allergy testing. Since the onset of symptoms, her voice has slightly worsened but not drastically.   She occasionally experiences acid reflux but has no official diagnosis of heartburn or reflux. No significant change in her voice throughout the day related to reflux symptoms.   Her current medications include an iron supplement and Promacta . She was previously on prednisone  but has been tapered off. She is not using any inhalers and has no history of smoking or secondhand smoke exposure.   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 No  FALLS: Has patient fallen in last 6 months? No  LIVING ENVIRONMENT: Lives with: lives with their family Lives in: House/apartment  PLOF:Level of assistance: Independent with ADLs, Independent with IADLs Employment: Full-time employment  PATIENT GOALS: "sound better. For people to not notice it's raspy"  OBJECTIVE:  Note: Objective measures were completed at Evaluation unless otherwise noted.  TREATMENT DATE:  SOVTE= semi occluded vocal tract exercises   04/14/24: Complete SOVTE using straw in water  with sustained hum at different pitches and pitch glides as vocal warm up. Address improved vocal quality via conversational therapy training using cues for clear speech and SLP providing usual feedback, modeling, and cueing to optimize vocal quality in speech tasks. Pt able to demonstrate improved vocal quality with reduced instances of gravelly quality and pressed resonance at extended sentence level  with 80% accuracy. Occasional glottal fry noted at end of utterances. Pt demonstrating good awareness of vocal quality with her perception of performance often matching SLP observation. Use of negative practice utilized with SLP instruction for using as facilitative practice for "fixing" voice throughout natural speaking. Recommend use of refluxraft or reflux gourmet prior to bed, per ENT, to reduce sensation of phlegm, increased throat clearing, and worse vocal quality in AM.   03/22/24: Evaluation and POC complete. Initiated education and instruction of behavioral management of acid reflux, throat clear alternatives, and SOVTE. Pt able to complete with rare min A for increased forward resonance. Handouts provided.   PATIENT EDUCATION: Education details: see above Person educated: Patient Education method: Chief Technology Officer Education comprehension: verbalized understanding and needs further education  HOME EXERCISE PROGRAM: Vocal hygiene, SOVTE, resonant   GOALS: Goals reviewed with patient? Yes  SHORT TERM GOALS: Target date: 04/19/2024  Pt will complete daily HEP x2 sessions Baseline: Goal status: INITIAL  2.  Pt will demonstrate targeted voice exercises with rare min A x2 sessions Baseline:  Goal status: INITIAL  3.  Pt will carryover improved vocal quality on structured task given rare min A  Baseline:  Goal status: INITIAL  4.  Pt will carryover improved vocal quality in structured conversation give rare min A  Baseline:  Goal status: INITIAL  LONG TERM GOALS: Target date: 05/31/2024  Pt will complete HEP > 1 week  Baseline:  Goal status: INITIAL  2.  Pt will independently suppress throat clears with none evidenced across 2 sessions  Baseline:  Goal status: INITIAL  3.  Pt will carryover improved vocal quality in unstructured conversations x2 give rare min A  Baseline:  Goal status: INITIAL  4.  Pt will report improved voice quality via PROM by 2 points by LTG  date Baseline: VRQOL=17 Goal status: INITIAL   ASSESSMENT:  CLINICAL IMPRESSION: Patient is a 28 y.o. 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 Today, pt presents with mild raspy/hoarse vocal quality. Endorsed voice changes started last August when new medication initiated. ENT suspects nodules related to phonotrauma from occupation (PT) and possibly GERD. Increasingly aware of throat clearing, which has decreased with only one throat clear evidenced today. Skilled ST intervention warranted to optimize vocal hygiene and efficient voicing given presence of nodules.   OBJECTIVE IMPAIRMENTS: include voice disorder. These impairments are limiting patient from effectively communicating at home and in community. Factors affecting potential to achieve goals and functional outcome are previous level of function. Patient will benefit from skilled SLP services to address above impairments and improve overall function.  REHAB POTENTIAL: Good  PLAN:  SLP FREQUENCY: 1-2x/week  SLP DURATION: 10 weeks (extended for scheduling)  PLANNED INTERVENTIONS: Environmental controls, Cueing hierachy, Functional tasks, SLP instruction and feedback, Compensatory strategies, Patient/family education, [phone] Treatment of speech (30 or 45 min) , and [id number]- Speech Eval Behavioral Qualitative Voice Resonance    Alston Jerry, CCC-SLP 04/14/2024, 8:12 AM

## 2024-04-12 ENCOUNTER — Inpatient Hospital Stay

## 2024-04-12 ENCOUNTER — Inpatient Hospital Stay: Admitting: Oncology

## 2024-04-12 VITALS — BP 118/89 | HR 109 | Temp 97.7°F | Resp 16 | Ht 64.0 in | Wt 267.1 lb

## 2024-04-12 DIAGNOSIS — D696 Thrombocytopenia, unspecified: Secondary | ICD-10-CM | POA: Diagnosis not present

## 2024-04-12 DIAGNOSIS — D693 Immune thrombocytopenic purpura: Secondary | ICD-10-CM | POA: Diagnosis not present

## 2024-04-12 LAB — CBC WITH DIFFERENTIAL (CANCER CENTER ONLY)
Abs Immature Granulocytes: 0.01 10*3/uL (ref 0.00–0.07)
Basophils Absolute: 0 10*3/uL (ref 0.0–0.1)
Basophils Relative: 0 %
Eosinophils Absolute: 0.2 10*3/uL (ref 0.0–0.5)
Eosinophils Relative: 2 %
HCT: 38.7 % (ref 36.0–46.0)
Hemoglobin: 12.1 g/dL (ref 12.0–15.0)
Immature Granulocytes: 0 %
Lymphocytes Relative: 27 %
Lymphs Abs: 2.1 10*3/uL (ref 0.7–4.0)
MCH: 25.1 pg — ABNORMAL LOW (ref 26.0–34.0)
MCHC: 31.3 g/dL (ref 30.0–36.0)
MCV: 80.1 fL (ref 80.0–100.0)
Monocytes Absolute: 0.4 10*3/uL (ref 0.1–1.0)
Monocytes Relative: 6 %
Neutro Abs: 5.1 10*3/uL (ref 1.7–7.7)
Neutrophils Relative %: 65 %
Platelet Count: 61 10*3/uL — ABNORMAL LOW (ref 150–400)
RBC: 4.83 MIL/uL (ref 3.87–5.11)
RDW: 17.2 % — ABNORMAL HIGH (ref 11.5–15.5)
WBC Count: 7.9 10*3/uL (ref 4.0–10.5)
nRBC: 0 % (ref 0.0–0.2)

## 2024-04-12 LAB — FERRITIN: Ferritin: 24 ng/mL (ref 11–307)

## 2024-04-12 NOTE — Progress Notes (Signed)
  [facility] OFFICE PROGRESS NOTE   Diagnosis: ITP  INTERVAL HISTORY:   Ms. Housden returns as scheduled.  She continues Promacta .  She has a monthly menstrual cycle.  She is taking an over-the-counter iron twice daily.  No bleeding other than the menstrual cycle.  No complaint.  Objective:  Vital signs in last 24 hours:  Blood pressure 118/89, pulse (!) 109, temperature 97.7 F (36.5 C), temperature source Oral, resp. rate 16, height 5\' 4"  (1.626 m), weight 267 lb 1.6 oz (121.2 kg), SpO2 100%.    HEENT: No thrush or bleeding Resp: Lungs clear bilaterally Cardio: Regular rate and rhythm GI: No hepatosplenomegaly, nontender, no mass Vascular: No leg edema  Skin: No ecchymoses or petechiae   Lab Results:  Lab Results  Component Value Date   WBC 7.9 04/12/2024   HGB 12.1 04/12/2024   HCT 38.7 04/12/2024   MCV 80.1 04/12/2024   PLT 61 (L) 04/12/2024   NEUTROABS 5.1 04/12/2024    CMP  Lab Results  Component Value Date   NA 137 02/19/2024   K 3.7 02/19/2024   CL 104 02/19/2024   CO2 24 02/19/2024   GLUCOSE 84 02/19/2024   BUN 9 02/19/2024   CREATININE 0.72 02/19/2024   CALCIUM 8.8 (L) 02/19/2024   PROT 7.4 02/19/2024   ALBUMIN 4.4 02/19/2024   AST 14 (L) 02/19/2024   ALT 14 02/19/2024   ALKPHOS 81 02/19/2024   BILITOT 0.5 02/19/2024   GFRNONAA >60 02/19/2024   GFRAA 101 05/25/2020    No results found for: "CEA1", "CEA", "CAN199", "CA125"  Lab Results  Component Value Date   INR 1.1 07/25/2022   LABPROT 13.6 07/25/2022    Imaging:  No results found.  Medications: I have reviewed the patient's current medications.   Assessment/Plan: ITP presenting with platelet count 19,000 07/25/2022 Solu-Medrol  followed by prednisone  Relapse September 2023 with prednisone  taper to 10 mg Rituximab  09/19/2022-4 weekly doses Prednisone  taper following rituximab  Prednisone  taper to 5 mg daily 11/04/2022 Recurrent thrombocytopenia 12/19/2022, prednisone   increased to 40 mg daily 12/26/2022 Prednisone  decreased to 30 mg daily 01/16/2023 Prednisone  decreased to 20 mg daily 02/23/2023 Prednisone  decreased to 15 mg daily 03/20/2023 Prednisone  decreased to 10 mg daily beginning 04/27/2023 Prednisone  increased to 20 mg daily 05/15/2023 Promacta  started 05/20/2023 Prednisone  taper to 15 mg daily for 4 days, then 10 mg daily Platelet count 39,000 06/26/2023 Promacta  dose increased to 75 mg daily (first dose 07/01/2023) Prednisone  discontinued per patient late July 2024     2.  History of anemia secondary to menorrhagia and iron deficiency-recurrent iron deficiency anemia August 2024     Disposition: Ms. Holder appears stable.  She is tolerating the Promacta  well.  She will continue over-the-counter iron.  The iron deficiency has corrected.  She will call for spontaneous bleeding or bruising.  She will return for a lab visit in 4 weeks and an office visit in 10 weeks. The plan is to initiate pulsed Decadron  therapy if the platelet count falls.  Coni Deep, MD  04/12/2024  9:30 AM

## 2024-04-13 ENCOUNTER — Other Ambulatory Visit (HOSPITAL_COMMUNITY): Payer: Self-pay

## 2024-04-14 ENCOUNTER — Other Ambulatory Visit: Payer: Self-pay

## 2024-04-14 ENCOUNTER — Ambulatory Visit: Payer: Self-pay | Attending: Otolaryngology | Admitting: Speech Pathology

## 2024-04-14 ENCOUNTER — Other Ambulatory Visit (HOSPITAL_COMMUNITY): Payer: Self-pay

## 2024-04-14 ENCOUNTER — Other Ambulatory Visit: Payer: Self-pay | Admitting: Pharmacy Technician

## 2024-04-14 DIAGNOSIS — R490 Dysphonia: Secondary | ICD-10-CM | POA: Diagnosis present

## 2024-04-14 NOTE — Progress Notes (Signed)
 Specialty Pharmacy Refill Coordination Note  Daisy Meyers is a 28 y.o. female contacted today regarding refills of specialty medication(s) Eltrombopag  Olamine (PROMACTA )   Patient requested [name] at [facility] at [place] date: 04/19/24   Medication will be filled on 04/18/24.

## 2024-04-18 ENCOUNTER — Other Ambulatory Visit: Payer: Self-pay

## 2024-04-18 NOTE — Therapy (Unsigned)
 OUTPATIENT SPEECH LANGUAGE PATHOLOGY TREATMENT NOTE   Patient Name: Daisy Meyers MRN: [id number] DOB:[birth year], 28 y.o., female Today's Date: 04/19/2024  PCP: Ulyess Gammons, NP REFERRING PROVIDER: Artice Last, MD  END OF SESSION:  End of Session - 04/19/24 0805     Visit Number 3    Number of Visits 9    Date for SLP Re-Evaluation 05/31/24    Authorization Type BC[employer]    SLP Start Time 0805    SLP Stop Time  0845    SLP Time Calculation (min) 40 min    Activity Tolerance Patient tolerated treatment well               Past Medical History:  Diagnosis Date   Depression    Past Surgical History:  Procedure Laterality Date   CYST EXCISION     pilonidinal   FOOT FOREIGN BODY REMOVAL  08/2016   FOREIGN BODY REMOVAL Left 08/15/2016   Procedure: REMOVAL FOREIGN BODY LEFT FOOT;  Surgeon: Hardy Lia, MD;  Location: [facility];  Service: Orthopedics;  Laterality: Left;   WISDOM TOOTH EXTRACTION     Patient Active Problem List   Diagnosis Date Noted   Menorrhagia 07/26/2022   Leukocytosis 07/26/2022   Severe anemia 07/25/2022   Abnormal vaginal bleeding 07/25/2022   Thrombocytopenia (HCC) 07/25/2022    Onset date: 02/18/2024 (referral date)  REFERRING DIAG: R49.0 (ICD-10-CM) - Dysphonia R49.0 (ICD-10-CM) - Hoarseness J38.2 (ICD-10-CM) - 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 She was referred by her oncologist for evaluation of hoarseness after starting the medication.   She experiences hoarseness and a raspy voice that fluctuates after starting a new medication. Her voice remains hoarse and raspy  without a specific pattern throughout the day. No pain when talking, changes in swallowing, or shortness of breath. Denies postnasal drainage, but has some nasal congestion. No prior allergy testing. Since the onset of symptoms, her voice has slightly worsened but not drastically.   She occasionally experiences acid reflux but has no official diagnosis of heartburn or reflux. No significant change in her voice throughout the day related to reflux symptoms.   Her current medications include an iron supplement and Promacta . She was previously on prednisone  but has been tapered off. She is not using any inhalers and has no history of smoking or secondhand smoke exposure.   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 No  FALLS: Has patient fallen in last 6 months? No  LIVING ENVIRONMENT: Lives with: lives with their family Lives in: House/apartment  PLOF:Level of assistance: Independent with ADLs, Independent with IADLs Employment: Full-time employment  PATIENT GOALS: "sound better. For people to not notice it's raspy"  OBJECTIVE:  Note: Objective measures were completed at Evaluation unless otherwise noted.  TREATMENT DATE:  SOVTE= semi occluded vocal tract exercises   04/19/24: Completed brief SOVTE warm-up with mod  I. Targeted CTT in extended conversation with occasional min A to optimize forward resonance. Vocal clarity varied d/t fluctuations in volume and maintenance of forward projection. Sensate to excessive loudness averaging upper 70s-low 80s dB. Targeted volume modification with focus on forward resonance at lips, with improved volume and clarity achieved. Provided additional education of breathing techniques when globus sensation persists. Handout provided.  04/14/24:  Complete SOVTE using straw in water  with sustained hum at different pitches and pitch glides as vocal warm up. Address improved vocal quality via conversational therapy training using cues for clear speech and SLP providing usual feedback, modeling, and cueing to optimize vocal quality in speech tasks. Pt able to demonstrate improved vocal quality with reduced instances of gravelly quality and pressed resonance at extended sentence level with 80% accuracy. Occasional glottal fry noted at end of utterances. Pt demonstrating good awareness of vocal quality with her perception of performance often matching SLP observation. Use of negative practice utilized with SLP instruction for using as facilitative practice for "fixing" voice throughout natural speaking. Recommend use of refluxraft or reflux gourmet prior to bed, per ENT, to reduce sensation of phlegm, increased throat clearing, and worse vocal quality in AM.   03/22/24: Evaluation and POC complete. Initiated education and instruction of behavioral management of acid reflux, throat clear alternatives, and SOVTE. Pt able to complete with rare min A for increased forward resonance. Handouts provided.   PATIENT EDUCATION: Education details: see above Person educated: Patient Education method: [employer] Education comprehension: verbalized understanding and needs further education  HOME EXERCISE PROGRAM: Vocal hygiene, SOVTE, resonant   GOALS: Goals reviewed with patient? Yes  SHORT TERM GOALS: Target date: 04/19/2024  Pt will complete daily HEP x2 sessions Baseline: Goal status: MET  2.  Pt will demonstrate targeted voice exercises with rare min A x2 sessions Baseline:  Goal status: MET  3.  Pt will carryover improved vocal quality on structured task given rare min A  Baseline:  Goal status: MET  4.  Pt will carryover improved vocal quality in structured conversation give rare min A  Baseline:  Goal status: MET  LONG TERM  GOALS: Target date: 05/31/2024  Pt will complete HEP > 1 week  Baseline:  Goal status: INITIAL  2.  Pt will independently suppress throat clears with none evidenced across 2 sessions  Baseline:  Goal status: INITIAL  3.  Pt will carryover improved vocal quality in unstructured conversations x2 give rare min A  Baseline:  Goal status: INITIAL  4.  Pt will report improved voice quality via PROM by 2 points by LTG date Baseline: VRQOL=17 Goal status: INITIAL   ASSESSMENT:  CLINICAL IMPRESSION: Patient is a 28 y.o. 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 Continued education and instruction of voice exercises and techniques to optimize clarity and vocal hygiene. Good carryover demonstrated. Skilled ST intervention warranted to optimize vocal hygiene and efficient voicing given presence of nodules.   OBJECTIVE IMPAIRMENTS: include voice disorder. These impairments are limiting patient from effectively communicating at home and in community. Factors affecting potential to achieve goals and functional outcome are previous level of function. Patient will benefit from skilled SLP services to address above impairments and improve overall function.  REHAB POTENTIAL: Good  PLAN:  SLP FREQUENCY: 1-2x/week  SLP DURATION: 10 weeks (extended for scheduling)  PLANNED INTERVENTIONS: Environmental controls,  Cueing hierachy, Functional tasks, SLP instruction and feedback, Compensatory strategies, Patient/family education, [phone] Treatment of speech (30 or 45 min) , and [id number]- Speech Eval Behavioral Qualitative Voice Resonance    Tamar Fairly, CCC-SLP 04/19/2024, 8:44 AM

## 2024-04-19 ENCOUNTER — Ambulatory Visit: Payer: Self-pay

## 2024-04-19 DIAGNOSIS — R490 Dysphonia: Secondary | ICD-10-CM

## 2024-04-26 ENCOUNTER — Ambulatory Visit: Payer: Self-pay

## 2024-05-03 ENCOUNTER — Ambulatory Visit: Payer: Self-pay | Admitting: Speech Pathology

## 2024-05-03 DIAGNOSIS — R490 Dysphonia: Secondary | ICD-10-CM | POA: Diagnosis not present

## 2024-05-03 NOTE — Therapy (Signed)
 OUTPATIENT SPEECH LANGUAGE PATHOLOGY TREATMENT NOTE   Patient Name: Daisy Meyers MRN: [id number] DOB:May 08, 1996, 28 y.o., female Today's Date: 05/03/2024  PCP: Ulyess Gammons, NP REFERRING PROVIDER: Artice Last, MD  END OF SESSION:  End of Session - 05/03/24 0804     Visit Number 4    Number of Visits 9    Date for SLP Re-Evaluation 05/31/24    Authorization Type BCBS    SLP Start Time 0805    SLP Stop Time  0845    SLP Time Calculation (min) 40 min    Activity Tolerance Patient tolerated treatment well               Past Medical History:  Diagnosis Date   Depression    Past Surgical History:  Procedure Laterality Date   CYST EXCISION     pilonidinal   FOOT FOREIGN BODY REMOVAL  08/2016   FOREIGN BODY REMOVAL Left 08/15/2016   Procedure: REMOVAL FOREIGN BODY LEFT FOOT;  Surgeon: Hardy Lia, MD;  Location[facility] OR;  Service: Orthopedics;  Laterality: Left;   WISDOM TOOTH EXTRACTION     Patient Active Problem List   Diagnosis Date Noted   Menorrhagia 07/26/2022   Leukocytosis 07/26/2022   Severe anemia 07/25/2022   Abnormal vaginal bleeding 07/25/2022   Thrombocytopenia (HCC) 07/25/2022    Onset date: 02/18/2024 (referral date)  REFERRING DIAG: R49.0 (ICD-10-CM) - Dysphonia R49.0 (ICD-10-CM) - Hoarseness J38.2 (ICD-10-CM) - 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 She was referred by her oncologist for evaluation of hoarseness after starting the medication.   She experiences hoarseness and a raspy voice that fluctuates after starting a new medication. Her  voice remains hoarse and raspy without a specific pattern throughout the day. No pain when talking, changes in swallowing, or shortness of breath. Denies postnasal drainage, but has some nasal congestion. No prior allergy testing. Since the onset of symptoms, her voice has slightly worsened but not drastically.   She occasionally experiences acid reflux but has no official diagnosis of heartburn or reflux. No significant change in her voice throughout the day related to reflux symptoms.   Her current medications include an iron supplement and Promacta . She was previously on prednisone  but has been tapered off. She is not using any inhalers and has no history of smoking or secondhand smoke exposure.   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 No  FALLS: Has patient fallen in last 6 months? No  LIVING ENVIRONMENT: Lives with: lives with their family Lives in: House/apartment  PLOF:Level of assistance: Independent with ADLs, Independent with IADLs Employment: Full-time employment  PATIENT GOALS: "sound better. For people to not notice it's raspy"  OBJECTIVE:  Note: Objective measures were completed at Evaluation unless otherwise noted.  TREATMENT DATE:  SOVTE= semi occluded vocal tract exercises   05/03/24: Completed warm up c/w pt home practice. Demonstrating clear voicing with hum and "oo" for sustained phonation, sirens, and ascending and descending pitch glides. Reviewed strategies for improved vocal quality: forward resonance, pacing, clear articulation, pitch variation. Utilized negative practice throughout Best boy. Pt read a poem using strategies with occasional cues for reducing coughs (d/t stuffiness?) via non-vocalized cough, sip and swallow. Overall, pt  demonstrating clear vocal quality, forward resonance. Min-A to ID moments of pressed resonance. Target use of strategies and improved vocal quality in conversation with pt demonstrating improved vocal quality 95% of opportunities with mod-I. Modified POC to cancel next session, will meet for final scheduled session to ensure ongoing progress, address any concerns.   04/19/24: Completed brief SOVTE warm-up with mod  I. Targeted CTT in extended conversation with occasional min A to optimize forward resonance. Vocal clarity varied d/t fluctuations in volume and maintenance of forward projection. Sensate to excessive loudness averaging upper 70s-low 80s dB. Targeted volume modification with focus on forward resonance at lips, with improved volume and clarity achieved. Provided additional education of breathing techniques when globus sensation persists. Handout provided.  04/14/24: Complete SOVTE using straw in water  with sustained hum at different pitches and pitch glides as vocal warm up. Address improved vocal quality via conversational therapy training using cues for clear speech and SLP providing usual feedback, modeling, and cueing to optimize vocal quality in speech tasks. Pt able to demonstrate improved vocal quality with reduced instances of gravelly quality and pressed resonance at extended sentence level with 80% accuracy. Occasional glottal fry noted at end of utterances. Pt demonstrating good awareness of vocal quality with her perception of performance often matching SLP observation. Use of negative practice utilized with SLP instruction for using as facilitative practice for "fixing" voice throughout natural speaking. Recommend use of refluxraft or reflux gourmet prior to bed, per ENT, to reduce sensation of phlegm, increased throat clearing, and worse vocal quality in AM.   03/22/24: Evaluation and POC complete. Initiated education and instruction of behavioral management of acid reflux, throat clear  alternatives, and SOVTE. Pt able to complete with rare min A for increased forward resonance. Handouts provided.   PATIENT EDUCATION: Education details: see above Person educated: Patient Education method: Chief Technology Officer Education comprehension: verbalized understanding and needs further education  HOME EXERCISE PROGRAM: Vocal hygiene, SOVTE, resonant   GOALS: Goals reviewed with patient? Yes  SHORT TERM GOALS: Target date: 04/19/2024  Pt will complete daily HEP x2 sessions Baseline: Goal status: MET  2.  Pt will demonstrate targeted voice exercises with rare min A x2 sessions Baseline:  Goal status: MET  3.  Pt will carryover improved vocal quality on structured task given rare min A  Baseline:  Goal status: MET  4.  Pt will carryover improved vocal quality in structured conversation give rare min A  Baseline:  Goal status: MET  LONG TERM GOALS: Target date: 05/31/2024  Pt will complete HEP > 1 week  Baseline:  Goal status: INITIAL  2.  Pt will independently suppress throat clears with none evidenced across 2 sessions  Baseline:  Goal status: INITIAL  3.  Pt will carryover improved vocal quality in unstructured conversations x2 give rare min A  Baseline:  Goal status: INITIAL  4.  Pt will report improved voice quality via PROM by 2 points by LTG date Baseline: VRQOL=17 Goal status: INITIAL   ASSESSMENT:  CLINICAL IMPRESSION: Patient is  a 28 y.o. 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 Continued education and instruction of voice exercises and techniques to optimize clarity and vocal hygiene. Good carryover demonstrated. Skilled ST intervention warranted to optimize vocal hygiene and efficient voicing given presence of nodules.   OBJECTIVE IMPAIRMENTS: include voice disorder. These impairments are limiting patient from effectively communicating at home and in community. Factors affecting potential to achieve goals and  functional outcome are previous level of function. Patient will benefit from skilled SLP services to address above impairments and improve overall function.  REHAB POTENTIAL: Good  PLAN:  SLP FREQUENCY: 1-2x/week  SLP DURATION: 10 weeks (extended for scheduling)  PLANNED INTERVENTIONS: Environmental controls, Cueing hierachy, Functional tasks, SLP instruction and feedback, Compensatory strategies, Patient/family education, [phone] Treatment of speech (30 or 45 min) , and 60454- Speech Eval Behavioral Qualitative Voice Resonance    Alston Jerry, CCC-SLP 05/03/2024, 8:06 AM

## 2024-05-10 ENCOUNTER — Encounter: Payer: Self-pay | Admitting: Speech Pathology

## 2024-05-10 ENCOUNTER — Inpatient Hospital Stay: Attending: Oncology

## 2024-05-11 ENCOUNTER — Other Ambulatory Visit (HOSPITAL_COMMUNITY): Payer: Self-pay

## 2024-05-12 ENCOUNTER — Other Ambulatory Visit: Payer: Self-pay

## 2024-05-13 ENCOUNTER — Inpatient Hospital Stay: Attending: Oncology

## 2024-05-13 ENCOUNTER — Other Ambulatory Visit: Payer: Self-pay

## 2024-05-13 DIAGNOSIS — D696 Thrombocytopenia, unspecified: Secondary | ICD-10-CM

## 2024-05-13 DIAGNOSIS — D693 Immune thrombocytopenic purpura: Secondary | ICD-10-CM | POA: Insufficient documentation

## 2024-05-13 LAB — CBC WITH DIFFERENTIAL (CANCER CENTER ONLY)
Abs Immature Granulocytes: 0.02 10*3/uL (ref 0.00–0.07)
Basophils Absolute: 0 10*3/uL (ref 0.0–0.1)
Basophils Relative: 0 %
Eosinophils Absolute: 0.2 10*3/uL (ref 0.0–0.5)
Eosinophils Relative: 2 %
HCT: 35.4 % — ABNORMAL LOW (ref 36.0–46.0)
Hemoglobin: 11.4 g/dL — ABNORMAL LOW (ref 12.0–15.0)
Immature Granulocytes: 0 %
Lymphocytes Relative: 24 %
Lymphs Abs: 2.2 10*3/uL (ref 0.7–4.0)
MCH: 25.7 pg — ABNORMAL LOW (ref 26.0–34.0)
MCHC: 32.2 g/dL (ref 30.0–36.0)
MCV: 79.9 fL — ABNORMAL LOW (ref 80.0–100.0)
Monocytes Absolute: 0.4 10*3/uL (ref 0.1–1.0)
Monocytes Relative: 5 %
Neutro Abs: 6.3 10*3/uL (ref 1.7–7.7)
Neutrophils Relative %: 69 %
Platelet Count: 50 10*3/uL — ABNORMAL LOW (ref 150–400)
RBC: 4.43 MIL/uL (ref 3.87–5.11)
RDW: 16.5 % — ABNORMAL HIGH (ref 11.5–15.5)
WBC Count: 9.2 10*3/uL (ref 4.0–10.5)
nRBC: 0 % (ref 0.0–0.2)

## 2024-05-16 ENCOUNTER — Other Ambulatory Visit: Payer: Self-pay

## 2024-05-16 ENCOUNTER — Telehealth: Payer: Self-pay | Admitting: Oncology

## 2024-05-16 ENCOUNTER — Telehealth: Payer: Self-pay | Admitting: *Deleted

## 2024-05-16 DIAGNOSIS — D696 Thrombocytopenia, unspecified: Secondary | ICD-10-CM

## 2024-05-16 NOTE — Telephone Encounter (Signed)
 Patient has been scheduled for follow-up visit per 05/16/24 LOS.  LVM notifying pt of appt details, provided my direct number to pt if appt changes need to be made.

## 2024-05-16 NOTE — Telephone Encounter (Signed)
-----   Message from Diana Forster sent at 05/16/2024 11:20 AM EDT ----- Please let her know the platelet count is a little lower.  Has she missed any doses of Promacta ?  Check CBC in 3 weeks.

## 2024-05-16 NOTE — Telephone Encounter (Signed)
 Per NP review of labs: platelet count is a little lower.  Has she missed any doses of Promacta ?  Check CBC in 3 weeks.  Called Daisy Meyers and left VM to call office tomorrow to discuss her lab results. Scheduling message sent for lab appointment.

## 2024-05-17 ENCOUNTER — Ambulatory Visit: Payer: Self-pay | Attending: Otolaryngology | Admitting: Speech Pathology

## 2024-05-17 ENCOUNTER — Encounter: Payer: Self-pay | Admitting: *Deleted

## 2024-05-17 DIAGNOSIS — R490 Dysphonia: Secondary | ICD-10-CM | POA: Insufficient documentation

## 2024-05-17 NOTE — Therapy (Signed)
 OUTPATIENT SPEECH LANGUAGE PATHOLOGY TREATMENT NOTE   Patient Name: Daisy Meyers MRN: [id number] DOB:[birth year], 28 y.o., female Today's Date: 05/17/2024  PCP: Ulyess Gammons, NP REFERRING PROVIDER: Artice Last, MD  END OF SESSION:  End of Session - 05/17/24 0803     Visit Number 5    Number of Visits 9    Date for SLP Re-Evaluation 05/31/24    Authorization Type BCBS    Activity Tolerance Patient tolerated treatment well               Past Medical History:  Diagnosis Date   Depression    Past Surgical History:  Procedure Laterality Date   CYST EXCISION     pilonidinal   FOOT FOREIGN BODY REMOVAL  08/2016   FOREIGN BODY REMOVAL Left 08/15/2016   Procedure: REMOVAL FOREIGN BODY LEFT FOOT;  Surgeon: Hardy Lia, MD;  Location: [facility] OR;  Service: Orthopedics;  Laterality: Left;   WISDOM TOOTH EXTRACTION     Patient Active Problem List   Diagnosis Date Noted   Menorrhagia 07/26/2022   Leukocytosis 07/26/2022   Severe anemia 07/25/2022   Abnormal vaginal bleeding 07/25/2022   Thrombocytopenia (HCC) 07/25/2022    Onset date: 02/18/2024 (referral date)  REFERRING DIAG: R49.0 (ICD-10-CM) - Dysphonia R49.0 (ICD-10-CM) - Hoarseness J38.2 (ICD-10-CM) - 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 She was referred by her oncologist for evaluation of hoarseness after starting the medication.   She experiences hoarseness and a raspy voice that fluctuates after starting a new medication. Her voice remains hoarse and raspy without a specific pattern  throughout the day. No pain when talking, changes in swallowing, or shortness of breath. Denies postnasal drainage, but has some nasal congestion. No prior allergy testing. Since the onset of symptoms, her voice has slightly worsened but not drastically.   She occasionally experiences acid reflux but has no official diagnosis of heartburn or reflux. No significant change in her voice throughout the day related to reflux symptoms.   Her current medications include an iron supplement and Promacta . She was previously on prednisone  but has been tapered off. She is not using any inhalers and has no history of smoking or secondhand smoke exposure.   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 No  FALLS: Has patient fallen in last 6 months? No  LIVING ENVIRONMENT: Lives with: lives with their family Lives in: House/apartment  PLOF:Level of assistance: Independent with ADLs, Independent with IADLs Employment: Full-time employment  PATIENT GOALS: "sound better. For people to not notice it's raspy"  OBJECTIVE:  Note: Objective measures were completed at Evaluation unless otherwise noted.  TREATMENT DATE:  SOVTE= semi occluded vocal tract exercises  05/17/24: In response to SLP questioning regarding practices to carryover following discharge patient identifies two: Vocal warm up in the morning and conversational voice quality techniques. SLP provided feedback on the identified options and additional instruction for cervical hygiene recommendations.  The patient reports carryover of reflux precautions start clear alternatives and increased hydration.  Completed patient reported outcome measure with patient evidencing improvement.  Patient is agreeable to discharge.  Voice Related Quality of  Life Measure Pt was asked to rate on a scale of 1-5 how much of a problem different speaking situations have been over the past two weeks     Because of my voice...  Pt Rating  I have trouble speaking loudly or being heard in noisy situations 1  I run out of air and need to take frequent breaths when talking 1  I sometimes do not know what will come out when I begin speaking 2  I am sometimes anxious or frustrated because of my voice 1  I sometimes get depressed because of my voice 1  I have trouble using the telephone because of my voice 1  I have trouble doing my job or practicing my profession because of my voice 1  I avoid going out socially because of my voice 1  I have to repeat myself to be understood 1  I have become less outgoing because of my voice 1  1= none, not a problem 5= problem is as "bad as it can be"  >80 is considered WNL   05/03/24: Completed warm up c/w pt home practice. Demonstrating clear voicing with hum and "oo" for sustained phonation, sirens, and ascending and descending pitch glides. Reviewed strategies for improved vocal quality: forward resonance, pacing, clear articulation, pitch variation. Utilized negative practice throughout Best boy. Pt read a poem using strategies with occasional cues for reducing coughs (d/t stuffiness?) via non-vocalized cough, sip and swallow. Overall, pt demonstrating clear vocal quality, forward resonance. Min-A to ID moments of pressed resonance. Target use of strategies and improved vocal quality in conversation with pt demonstrating improved vocal quality 95% of opportunities with mod-I. Modified POC to cancel next session, will meet for final scheduled session to ensure ongoing progress, address any concerns.   04/19/24: Completed brief SOVTE warm-up with mod  I. Targeted CTT in extended conversation with occasional min A to optimize forward resonance. Vocal clarity varied d/t fluctuations in volume and maintenance of forward  projection. Sensate to excessive loudness averaging upper 70s-low 80s dB. Targeted volume modification with focus on forward resonance at lips, with improved volume and clarity achieved. Provided additional education of breathing techniques when globus sensation persists. Handout provided.  04/14/24: Complete SOVTE using straw in water  with sustained hum at different pitches and pitch glides as vocal warm up. Address improved vocal quality via conversational therapy training using cues for clear speech and SLP providing usual feedback, modeling, and cueing to optimize vocal quality in speech tasks. Pt able to demonstrate improved vocal quality with reduced instances of gravelly quality and pressed resonance at extended sentence level with 80% accuracy. Occasional glottal fry noted at end of utterances. Pt demonstrating good awareness of vocal quality with her perception of performance often matching SLP observation. Use of negative practice utilized with SLP instruction for using as facilitative practice for "fixing" voice throughout natural speaking. Recommend use of refluxraft or reflux gourmet prior to bed, per ENT, to reduce sensation of phlegm, increased throat clearing, and  worse vocal quality in AM.   03/22/24: Evaluation and POC complete. Initiated education and instruction of behavioral management of acid reflux, throat clear alternatives, and SOVTE. Pt able to complete with rare min A for increased forward resonance. Handouts provided.   PATIENT EDUCATION: Education details: see above Person educated: Patient Education method: Chief Technology Officer Education comprehension: verbalized understanding and needs further education  HOME EXERCISE PROGRAM: Vocal hygiene, SOVTE, resonant   GOALS: Goals reviewed with patient? Yes  SHORT TERM GOALS: Target date: 04/19/2024  Pt will complete daily HEP x2 sessions Baseline: Goal status: MET  2.  Pt will demonstrate targeted voice exercises with  rare min A x2 sessions Baseline:  Goal status: MET  3.  Pt will carryover improved vocal quality on structured task given rare min A  Baseline:  Goal status: MET  4.  Pt will carryover improved vocal quality in structured conversation give rare min A  Baseline:  Goal status: MET  LONG TERM GOALS: Target date: 05/31/2024  Pt will complete HEP > 1 week  Baseline:  Goal status: MET  2.  Pt will independently suppress throat clears with none evidenced across 2 sessions  Baseline:  Goal status: MET  3.  Pt will carryover improved vocal quality in unstructured conversations x2 give rare min A  Baseline:  Goal status: MET  4.  Pt will report improved voice quality via PROM by 2 points by LTG date Baseline: VRQOL=17; 11 at discharge  Goal status: MET   ASSESSMENT:  CLINICAL IMPRESSION: States he was seen for lab speech therapy session this date targeting patient education for carryover of techniques to optimize vocal quality.  Patient demonstrates understanding of conversational therapy training techniques and resonant voice techniques in conversational speech this date to optimize vocal clarity.  She she demonstrates an via teach back of vocal hygiene techniques, vocal warm up and reflux precautions.  Patient is agreeable to discharge and is pleased with current status.  OBJECTIVE IMPAIRMENTS: include voice disorder. These impairments are limiting patient from effectively communicating at home and in community. Factors affecting potential to achieve goals and functional outcome are previous level of function. Patient will benefit from skilled SLP services to address above impairments and improve overall function.  REHAB POTENTIAL: Good  PLAN:  SLP FREQUENCY: 1-2x/week  SLP DURATION: 10 weeks (extended for scheduling)  PLANNED INTERVENTIONS: Environmental controls, Cueing hierachy, Functional tasks, SLP instruction and feedback, Compensatory strategies, Patient/family education,  [phone] Treatment of speech (30 or 45 min) , and [id number]- Speech Eval Behavioral Qualitative Voice Resonance  SPEECH THERAPY DISCHARGE SUMMARY  Visits from Start of Care: 5  Current functional level related to goals / functional outcomes: Pt demonstrates clear voicing in conversation with rare quality decline at end of extended utterances. Improved awareness of voice quality. Pt with teach back of vocal hygiene recommendations. Pt reports improved perception from self and partner regarding voice quality.    Remaining deficits: Rare dysphonia   Education / Equipment: flow phonation, resonant voice, conversational therapy training, semi-occluded vocal tract exercises, vocal hygiene, reflux management techniques and precautions, throat clear alternatives, HEP    Patient agrees to discharge. Patient goals were met. Patient is being discharged due to meeting the stated rehab goals.  Alston Jerry, CCC-SLP 05/17/2024, 8:03 AM

## 2024-05-18 ENCOUNTER — Other Ambulatory Visit: Payer: Self-pay

## 2024-05-18 ENCOUNTER — Telehealth: Payer: Self-pay | Admitting: Oncology

## 2024-05-18 ENCOUNTER — Other Ambulatory Visit: Payer: Self-pay | Admitting: Nurse Practitioner

## 2024-05-18 DIAGNOSIS — D696 Thrombocytopenia, unspecified: Secondary | ICD-10-CM

## 2024-05-18 NOTE — Telephone Encounter (Signed)
 Pt called to reschedule lab appt for 6/23 to 6/27

## 2024-05-18 NOTE — Progress Notes (Signed)
 Specialty Pharmacy Refill Coordination Note  Daisy Meyers is a 28 y.o. female contacted today regarding refills of specialty medication(s) Eltrombopag  Olamine (PROMACTA )   Patient requested [name] at [facility] at [place] date: 05/20/24   Medication will be filled on 06.05.25.   This fill date is pending response to refill request from provider. Patient is aware and if they have not received fill by intended date they must follow up with pharmacy.

## 2024-05-18 NOTE — Progress Notes (Signed)
 Specialty Pharmacy Ongoing Clinical Assessment Note  Daisy Meyers is a 28 y.o. female who is being followed by the specialty pharmacy service for RxSp Bleeding Disorders   Patient's specialty medication(s) reviewed today: Eltrombopag  Olamine (PROMACTA )   Missed doses in the last 4 weeks: 2   Patient/Caregiver did not have any additional questions or concerns.   Therapeutic benefit summary: Patient is achieving benefit   Adverse events/side effects summary: No adverse events/side effects   Patient's therapy is appropriate to: Continue    Goals Addressed             This Visit's Progress    Reduce long-term complications       Patient is on track. Patient will maintain adherence. Per recent RN telephone note patient platelet count has decreased a little and they will follow up in 3 weeks to recheck CBC.         Follow up: 6 months  Jarrett Chicoine M Ryota Treece Specialty Pharmacist

## 2024-05-19 ENCOUNTER — Ambulatory Visit (INDEPENDENT_AMBULATORY_CARE_PROVIDER_SITE_OTHER): Admitting: Otolaryngology

## 2024-05-19 ENCOUNTER — Other Ambulatory Visit: Payer: Self-pay

## 2024-05-19 MED ORDER — ELTROMBOPAG OLAMINE 75 MG PO TABS
75.0000 mg | ORAL_TABLET | Freq: Every day | ORAL | 2 refills | Status: DC
  Filled 2024-05-19: qty 30, 30d supply, fill #0
  Filled 2024-06-15 – 2024-08-09 (×3): qty 30, 30d supply, fill #1
  Filled 2024-09-06: qty 30, 30d supply, fill #2

## 2024-05-20 ENCOUNTER — Other Ambulatory Visit: Payer: Self-pay

## 2024-06-06 ENCOUNTER — Other Ambulatory Visit

## 2024-06-10 ENCOUNTER — Telehealth: Payer: Self-pay

## 2024-06-10 ENCOUNTER — Inpatient Hospital Stay: Attending: Oncology

## 2024-06-10 DIAGNOSIS — D696 Thrombocytopenia, unspecified: Secondary | ICD-10-CM

## 2024-06-10 DIAGNOSIS — D693 Immune thrombocytopenic purpura: Secondary | ICD-10-CM | POA: Diagnosis present

## 2024-06-10 DIAGNOSIS — Z79899 Other long term (current) drug therapy: Secondary | ICD-10-CM | POA: Diagnosis not present

## 2024-06-10 LAB — CBC WITH DIFFERENTIAL (CANCER CENTER ONLY)
Abs Immature Granulocytes: 0.02 10*3/uL (ref 0.00–0.07)
Basophils Absolute: 0 10*3/uL (ref 0.0–0.1)
Basophils Relative: 0 %
Eosinophils Absolute: 0.1 10*3/uL (ref 0.0–0.5)
Eosinophils Relative: 2 %
HCT: 35.8 % — ABNORMAL LOW (ref 36.0–46.0)
Hemoglobin: 11.7 g/dL — ABNORMAL LOW (ref 12.0–15.0)
Immature Granulocytes: 0 %
Lymphocytes Relative: 25 %
Lymphs Abs: 2.1 10*3/uL (ref 0.7–4.0)
MCH: 25.9 pg — ABNORMAL LOW (ref 26.0–34.0)
MCHC: 32.7 g/dL (ref 30.0–36.0)
MCV: 79.2 fL — ABNORMAL LOW (ref 80.0–100.0)
Monocytes Absolute: 0.4 10*3/uL (ref 0.1–1.0)
Monocytes Relative: 5 %
Neutro Abs: 5.6 10*3/uL (ref 1.7–7.7)
Neutrophils Relative %: 68 %
Platelet Count: 55 10*3/uL — ABNORMAL LOW (ref 150–400)
RBC: 4.52 MIL/uL (ref 3.87–5.11)
RDW: 14.6 % (ref 11.5–15.5)
WBC Count: 8.3 10*3/uL (ref 4.0–10.5)
nRBC: 0 % (ref 0.0–0.2)

## 2024-06-10 NOTE — Telephone Encounter (Signed)
 Patient gave verbal understanding and had no further questions or concerns

## 2024-06-10 NOTE — Telephone Encounter (Signed)
-----   Message from Olam Ned sent at 06/10/2024  2:53 PM EDT ----- Please let her know hemoglobin is higher, platelets stable.  Follow-up as scheduled.

## 2024-06-13 ENCOUNTER — Other Ambulatory Visit (HOSPITAL_COMMUNITY): Payer: Self-pay

## 2024-06-15 ENCOUNTER — Other Ambulatory Visit (HOSPITAL_COMMUNITY): Payer: Self-pay

## 2024-06-18 ENCOUNTER — Other Ambulatory Visit (HOSPITAL_COMMUNITY): Payer: Self-pay

## 2024-06-20 ENCOUNTER — Other Ambulatory Visit: Payer: Self-pay

## 2024-06-21 ENCOUNTER — Inpatient Hospital Stay

## 2024-06-21 ENCOUNTER — Inpatient Hospital Stay: Attending: Oncology | Admitting: Oncology

## 2024-06-21 VITALS — BP 104/78 | HR 93 | Temp 98.1°F | Resp 18 | Ht 64.0 in | Wt 262.9 lb

## 2024-06-21 DIAGNOSIS — D696 Thrombocytopenia, unspecified: Secondary | ICD-10-CM

## 2024-06-21 DIAGNOSIS — Z79899 Other long term (current) drug therapy: Secondary | ICD-10-CM | POA: Diagnosis not present

## 2024-06-21 DIAGNOSIS — D693 Immune thrombocytopenic purpura: Secondary | ICD-10-CM | POA: Diagnosis present

## 2024-06-21 DIAGNOSIS — N920 Excessive and frequent menstruation with regular cycle: Secondary | ICD-10-CM | POA: Diagnosis not present

## 2024-06-21 DIAGNOSIS — Z7952 Long term (current) use of systemic steroids: Secondary | ICD-10-CM | POA: Diagnosis not present

## 2024-06-21 DIAGNOSIS — D500 Iron deficiency anemia secondary to blood loss (chronic): Secondary | ICD-10-CM | POA: Insufficient documentation

## 2024-06-21 LAB — CBC (CANCER CENTER ONLY)
HCT: 37.2 % (ref 36.0–46.0)
Hemoglobin: 12 g/dL (ref 12.0–15.0)
MCH: 26.4 pg (ref 26.0–34.0)
MCHC: 32.3 g/dL (ref 30.0–36.0)
MCV: 81.8 fL (ref 80.0–100.0)
Platelet Count: 66 K/uL — ABNORMAL LOW (ref 150–400)
RBC: 4.55 MIL/uL (ref 3.87–5.11)
RDW: 14.1 % (ref 11.5–15.5)
WBC Count: 7.3 K/uL (ref 4.0–10.5)
nRBC: 0 % (ref 0.0–0.2)

## 2024-06-21 LAB — FERRITIN: Ferritin: 21 ng/mL (ref 11–307)

## 2024-06-21 NOTE — Progress Notes (Signed)
  [facility] OFFICE PROGRESS NOTE   Diagnosis: ITP  INTERVAL HISTORY:   Ms. Newcomer returns as scheduled.  She has a monthly menstrual cycle.  No other bleeding.  She continues Promacta .  No complaint. She is taking iron.  No constipation. Objective:  Vital signs in last 24 hours:  Blood pressure 104/78, pulse 93, temperature 98.1 F (36.7 C), temperature source Temporal, resp. rate 18, height 5' 4 (1.626 m), weight 262 lb 14.4 oz (119.3 kg), SpO2 100%.    Lymphatics: No cervical or supraclavicular nodes Resp: Lungs clear bilaterally Cardio: Regular rate and rhythm GI: No hepatosplenomegaly Vascular: No leg edema  Skin: Ecchymoses at sites of cupping at the back  Lab Results:  Lab Results  Component Value Date   WBC 7.3 06/21/2024   HGB 12.0 06/21/2024   HCT 37.2 06/21/2024   MCV 81.8 06/21/2024   PLT 66 (L) 06/21/2024   NEUTROABS 5.6 06/10/2024    CMP  Lab Results  Component Value Date   NA 137 02/19/2024   K 3.7 02/19/2024   CL 104 02/19/2024   CO2 24 02/19/2024   GLUCOSE 84 02/19/2024   BUN 9 02/19/2024   CREATININE 0.72 02/19/2024   CALCIUM 8.8 (L) 02/19/2024   PROT 7.4 02/19/2024   ALBUMIN 4.4 02/19/2024   AST 14 (L) 02/19/2024   ALT 14 02/19/2024   ALKPHOS 81 02/19/2024   BILITOT 0.5 02/19/2024   GFRNONAA >60 02/19/2024   GFRAA 101 05/25/2020     Medications: I have reviewed the patient's current medications.   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  History of anemia secondary to menorrhagia and iron deficiency-recurrent iron deficiency anemia August 2024       Disposition: Ms. Domanski has stable mild thrombocytopenia.  She we will continue Promacta .  She will call for spontaneous bleeding or bruising.  She will return for a lab visit in 2 months and an office visit in 4 months.  She will continue oral iron therapy.  Will follow-up on the ferritin level from today.  Arley Hof, MD  06/21/2024  8:25 AM

## 2024-07-22 ENCOUNTER — Encounter: Payer: Self-pay | Admitting: Oncology

## 2024-07-29 ENCOUNTER — Encounter: Payer: Self-pay | Admitting: Nurse Practitioner

## 2024-07-29 ENCOUNTER — Inpatient Hospital Stay: Attending: Oncology | Admitting: Nurse Practitioner

## 2024-07-29 ENCOUNTER — Inpatient Hospital Stay

## 2024-07-29 ENCOUNTER — Encounter: Payer: Self-pay | Admitting: *Deleted

## 2024-07-29 ENCOUNTER — Other Ambulatory Visit (HOSPITAL_BASED_OUTPATIENT_CLINIC_OR_DEPARTMENT_OTHER): Payer: Self-pay

## 2024-07-29 ENCOUNTER — Inpatient Hospital Stay: Admitting: Nurse Practitioner

## 2024-07-29 VITALS — BP 113/83 | HR 90 | Temp 98.4°F | Resp 18 | Ht 64.0 in | Wt 264.5 lb

## 2024-07-29 DIAGNOSIS — D500 Iron deficiency anemia secondary to blood loss (chronic): Secondary | ICD-10-CM | POA: Diagnosis not present

## 2024-07-29 DIAGNOSIS — Z79899 Other long term (current) drug therapy: Secondary | ICD-10-CM | POA: Diagnosis not present

## 2024-07-29 DIAGNOSIS — D509 Iron deficiency anemia, unspecified: Secondary | ICD-10-CM | POA: Diagnosis not present

## 2024-07-29 DIAGNOSIS — D693 Immune thrombocytopenic purpura: Secondary | ICD-10-CM | POA: Insufficient documentation

## 2024-07-29 DIAGNOSIS — N920 Excessive and frequent menstruation with regular cycle: Secondary | ICD-10-CM | POA: Insufficient documentation

## 2024-07-29 DIAGNOSIS — Z7952 Long term (current) use of systemic steroids: Secondary | ICD-10-CM | POA: Insufficient documentation

## 2024-07-29 DIAGNOSIS — R110 Nausea: Secondary | ICD-10-CM | POA: Insufficient documentation

## 2024-07-29 DIAGNOSIS — R197 Diarrhea, unspecified: Secondary | ICD-10-CM | POA: Diagnosis not present

## 2024-07-29 DIAGNOSIS — D696 Thrombocytopenia, unspecified: Secondary | ICD-10-CM

## 2024-07-29 LAB — CMP (CANCER CENTER ONLY)
ALT: 19 U/L (ref 0–44)
AST: 21 U/L (ref 15–41)
Albumin: 4.4 g/dL (ref 3.5–5.0)
Alkaline Phosphatase: 99 U/L (ref 38–126)
Anion gap: 14 (ref 5–15)
BUN: 10 mg/dL (ref 6–20)
CO2: 22 mmol/L (ref 22–32)
Calcium: 9.9 mg/dL (ref 8.9–10.3)
Chloride: 101 mmol/L (ref 98–111)
Creatinine: 0.73 mg/dL (ref 0.44–1.00)
GFR, Estimated: >60 mL/min (ref >60)
Glucose, Bld: 83 mg/dL (ref 70–99)
Potassium: 3.7 mmol/L (ref 3.5–5.1)
Sodium: 136 mmol/L (ref 135–145)
Total Bilirubin: 0.4 mg/dL (ref 0.0–1.2)
Total Protein: 7.7 g/dL (ref 6.5–8.1)

## 2024-07-29 LAB — CBC WITH DIFFERENTIAL (CANCER CENTER ONLY)
Abs Immature Granulocytes: 0.02 K/uL (ref 0.00–0.07)
Basophils Absolute: 0 K/uL (ref 0.0–0.1)
Basophils Relative: 0 %
Eosinophils Absolute: 0.2 K/uL (ref 0.0–0.5)
Eosinophils Relative: 2 %
HCT: 36 % (ref 36.0–46.0)
Hemoglobin: 11.7 g/dL — ABNORMAL LOW (ref 12.0–15.0)
Immature Granulocytes: 0 %
Lymphocytes Relative: 26 %
Lymphs Abs: 2.3 K/uL (ref 0.7–4.0)
MCH: 26.6 pg (ref 26.0–34.0)
MCHC: 32.5 g/dL (ref 30.0–36.0)
MCV: 81.8 fL (ref 80.0–100.0)
Monocytes Absolute: 0.4 K/uL (ref 0.1–1.0)
Monocytes Relative: 4 %
Neutro Abs: 5.9 K/uL (ref 1.7–7.7)
Neutrophils Relative %: 68 %
Platelet Count: 49 K/uL — ABNORMAL LOW (ref 150–400)
RBC: 4.4 MIL/uL (ref 3.87–5.11)
RDW: 13.6 % (ref 11.5–15.5)
WBC Count: 8.8 K/uL (ref 4.0–10.5)
nRBC: 0 % (ref 0.0–0.2)

## 2024-07-29 LAB — FERRITIN: Ferritin: 28 ng/mL (ref 11–307)

## 2024-07-29 MED ORDER — PANTOPRAZOLE SODIUM 20 MG PO TBEC
20.0000 mg | DELAYED_RELEASE_TABLET | Freq: Every day | ORAL | 1 refills | Status: DC
  Filled 2024-07-29: qty 30, 30d supply, fill #0

## 2024-07-29 NOTE — Progress Notes (Signed)
  [facility] OFFICE PROGRESS NOTE   Diagnosis:  ITP  INTERVAL HISTORY:   Daisy Meyers returns prior to scheduled follow-up for evaluation of tenderness over the liver.  She continues Promacta .  No bleeding.    About 2 weeks ago after taking oral iron on an empty stomach she had an episode of nausea and diarrhea.  She noted the stool was very dark brown, coffee-ground appearance.  This resolved over the course of the day.  No further episodes.  1 week ago she woke up and noted her right side was tender and hard.  The tenderness seems to be slowly improving.  She denies fever.  She reports a history of silent acid reflux.  The pain is not relieved or exacerbated with oral intake.  She does not take NSAIDs.  Objective:  Vital signs in last 24 hours:  Blood pressure 113/83, pulse 90, temperature 98.4 F (36.9 C), resp. rate 18, height 5' 4 (1.626 m), weight 264 lb 8 oz (120 kg), SpO2 100%.    HEENT: No thrush or ulcers.  Sclera anicteric. Resp: Lungs clear bilaterally. Cardio: Regular rate and rhythm. GI: No hepatosplenomegaly.  Tender right upper medial abdomen/epigastric region. Vascular: No leg edema. Neuro: Alert and oriented.   Lab Results:  Lab Results  Component Value Date   WBC 8.8 07/29/2024   HGB 11.7 (L) 07/29/2024   HCT 36.0 07/29/2024   MCV 81.8 07/29/2024   PLT 49 (L) 07/29/2024   NEUTROABS 5.9 07/29/2024    Imaging:  No results found.  Medications: I have reviewed the patient's current medications.  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  History of anemia secondary to menorrhagia and iron deficiency-recurrent iron deficiency anemia August 2024    Disposition: Daisy Meyers appears stable.  CBC from today shows stable mild thrombocytopenia.  She will continue Promacta .  She understands to contact the office with any bleeding.  Hemoglobin is stable.  She had an episode of nausea and dark loose stools about 2 weeks ago after taking oral iron, symptoms resolved over the course of the day.  A week ago she noted tenderness at the right upper abdomen.  On exam she is tender over the right medial upper abdomen/epigastric region.  We discussed the possibility of an ulcer.  She will begin pantoprazole .  Referral placed to GI.  She agrees with this plan.  She understands to contact the office if she has more black stools or pain worsens.  She will complete stool cards.  She will return for lab and follow-up in 4 weeks.  We are available to see her sooner if needed.  Plan reviewed with Dr. Cloretta.  Olam Ned ANP/GNP-BC   07/29/2024  2:49 PM

## 2024-07-29 NOTE — Progress Notes (Signed)
 Referral faxed to [facility] GI office at [phone]

## 2024-08-09 ENCOUNTER — Telehealth: Payer: Self-pay | Admitting: Pharmacy Technician

## 2024-08-09 ENCOUNTER — Encounter: Payer: Self-pay | Admitting: Oncology

## 2024-08-09 ENCOUNTER — Other Ambulatory Visit: Payer: Self-pay

## 2024-08-09 ENCOUNTER — Other Ambulatory Visit: Payer: Self-pay | Admitting: Pharmacy Technician

## 2024-08-09 ENCOUNTER — Other Ambulatory Visit (HOSPITAL_COMMUNITY): Payer: Self-pay

## 2024-08-09 NOTE — Telephone Encounter (Signed)
 Oral Oncology Patient Advocate Encounter  Prior Authorization for Eltrombopag  has been approved.    PA# [id number]  Effective dates: 08/08/2024 through 08/09/2025  Patients co-pay is $100.    Guillaume Weninger (Patty) [name], CPhT  [facility], [name], [place] Oral Chemotherapy Patient Advocate Specialist III Phone: [phone]  Fax: [phone]

## 2024-08-09 NOTE — Progress Notes (Signed)
 Specialty Pharmacy Refill Coordination Note  Daisy Meyers is a 28 y.o. female contacted today regarding refills of specialty medication(s) Eltrombopag  Olamine (PROMACTA )   Patient requested [name] at [facility] at [place] date: 08/10/24   Medication will be filled on 08/10/24.   Patient aware on order and that Odetta is working on [place]. She will wait for text alert before coming to pick up.

## 2024-08-09 NOTE — Telephone Encounter (Signed)
 Oral Oncology Patient Advocate Encounter   Was successful in obtaining a copay card for Eltrombopag .  This copay card will make the patients copay $0.  The copay card has an annual maximum benefit of $7,500   The billing information is as follows and has been shared with [facility].   RxBin: [id number] PCN: Loyalty Member ID: [id number] Group ID: [id number]   Daisy Meyers (Daisy Meyers) [name], CPhT  [facility], [name], [place] Oral Chemotherapy Patient Advocate Specialist III Phone: [phone]  Fax: [phone]

## 2024-08-09 NOTE — Telephone Encounter (Signed)
 Oral Oncology Patient Advocate Encounter   Re-authorization   Received notification that prior authorization for Eltrombopag  Olamine is required.   PA submitted on CMM via [employer] [id number]  Status is pending     Daisy Meyers (Patty) [name], CPhT  [facility], [name], [place] Oral Chemotherapy Patient Advocate Specialist III Phone: [phone]  Fax: [phone]

## 2024-08-09 NOTE — Progress Notes (Signed)
 Oral Oncology Patient Advocate Encounter  Prior authorization for Eltrombopag  has been submitted and approved.   Co-pay card has been obtained since patient was getting branded medication previously.  Wyvonna stated to let her know when PA response came back so correct medication can be ordered but she is now out of office.  Please order generic Promacta , Eltrombopag .   Thank you, Jing Howatt (Patty) [name], CPhT  [facility], [name], [place] Oral Chemotherapy Patient Advocate Specialist III Phone: [phone]  Fax: [phone]

## 2024-08-10 ENCOUNTER — Other Ambulatory Visit: Payer: Self-pay

## 2024-08-26 ENCOUNTER — Inpatient Hospital Stay: Attending: Oncology

## 2024-08-26 ENCOUNTER — Inpatient Hospital Stay: Admitting: Nurse Practitioner

## 2024-09-02 ENCOUNTER — Inpatient Hospital Stay (HOSPITAL_BASED_OUTPATIENT_CLINIC_OR_DEPARTMENT_OTHER): Admitting: Nurse Practitioner

## 2024-09-02 ENCOUNTER — Inpatient Hospital Stay: Attending: Oncology

## 2024-09-02 ENCOUNTER — Encounter: Payer: Self-pay | Admitting: Nurse Practitioner

## 2024-09-02 VITALS — BP 118/79 | HR 100 | Temp 98.3°F | Resp 18 | Ht 64.0 in | Wt 264.2 lb

## 2024-09-02 DIAGNOSIS — N920 Excessive and frequent menstruation with regular cycle: Secondary | ICD-10-CM | POA: Diagnosis not present

## 2024-09-02 DIAGNOSIS — D696 Thrombocytopenia, unspecified: Secondary | ICD-10-CM

## 2024-09-02 DIAGNOSIS — D500 Iron deficiency anemia secondary to blood loss (chronic): Secondary | ICD-10-CM | POA: Insufficient documentation

## 2024-09-02 DIAGNOSIS — D509 Iron deficiency anemia, unspecified: Secondary | ICD-10-CM | POA: Diagnosis not present

## 2024-09-02 DIAGNOSIS — D693 Immune thrombocytopenic purpura: Secondary | ICD-10-CM | POA: Diagnosis present

## 2024-09-02 DIAGNOSIS — Z7952 Long term (current) use of systemic steroids: Secondary | ICD-10-CM | POA: Insufficient documentation

## 2024-09-02 LAB — CBC WITH DIFFERENTIAL (CANCER CENTER ONLY)
Abs Immature Granulocytes: 0.02 K/uL (ref 0.00–0.07)
Basophils Absolute: 0 K/uL (ref 0.0–0.1)
Basophils Relative: 0 %
Eosinophils Absolute: 0.1 K/uL (ref 0.0–0.5)
Eosinophils Relative: 1 %
HCT: 34.5 % — ABNORMAL LOW (ref 36.0–46.0)
Hemoglobin: 11.2 g/dL — ABNORMAL LOW (ref 12.0–15.0)
Immature Granulocytes: 0 %
Lymphocytes Relative: 27 %
Lymphs Abs: 2.2 K/uL (ref 0.7–4.0)
MCH: 26.3 pg (ref 26.0–34.0)
MCHC: 32.5 g/dL (ref 30.0–36.0)
MCV: 81 fL (ref 80.0–100.0)
Monocytes Absolute: 0.4 K/uL (ref 0.1–1.0)
Monocytes Relative: 5 %
Neutro Abs: 5.3 K/uL (ref 1.7–7.7)
Neutrophils Relative %: 67 %
Platelet Count: 69 K/uL — ABNORMAL LOW (ref 150–400)
RBC: 4.26 MIL/uL (ref 3.87–5.11)
RDW: 13.4 % (ref 11.5–15.5)
WBC Count: 8 K/uL (ref 4.0–10.5)
nRBC: 0 % (ref 0.0–0.2)

## 2024-09-02 LAB — CMP (CANCER CENTER ONLY)
ALT: 14 U/L (ref 0–44)
AST: 17 U/L (ref 15–41)
Albumin: 4.5 g/dL (ref 3.5–5.0)
Alkaline Phosphatase: 96 U/L (ref 38–126)
Anion gap: 13 (ref 5–15)
BUN: 10 mg/dL (ref 6–20)
CO2: 21 mmol/L — ABNORMAL LOW (ref 22–32)
Calcium: 9.7 mg/dL (ref 8.9–10.3)
Chloride: 102 mmol/L (ref 98–111)
Creatinine: 0.82 mg/dL (ref 0.44–1.00)
GFR, Estimated: >60 mL/min (ref >60)
Glucose, Bld: 99 mg/dL (ref 70–99)
Potassium: 3.9 mmol/L (ref 3.5–5.1)
Sodium: 137 mmol/L (ref 135–145)
Total Bilirubin: 0.4 mg/dL (ref 0.0–1.2)
Total Protein: 7.8 g/dL (ref 6.5–8.1)

## 2024-09-02 NOTE — Progress Notes (Signed)
  [facility] OFFICE PROGRESS NOTE   Diagnosis: ITP  INTERVAL HISTORY:   Daisy Meyers returns as scheduled.  No bleeding aches left late menstrual cycle.  She is tolerating oral iron, takes twice a day.  No constipation.  No further black stools.  No nausea or vomiting.  The pain at the right abdomen/epigastric region is better.  She reports she was diagnosed with costochondritis.  Objective:  Vital signs in last 24 hours:  Blood pressure 118/79, pulse 100, temperature 98.3 F (36.8 C), temperature source Temporal, resp. rate 18, height 5' 4 (1.626 m), weight 264 lb 3.2 oz (119.8 kg), SpO2 98%.    HEENT: No thrush or ulcers. Resp: Lungs clear bilaterally. Cardio: Regular rate and rhythm. GI: No hepatosplenomegaly.  Tender right upper abdomen extending to the epigastric region. Vascular: No leg edema.   Lab Results:  Lab Results  Component Value Date   WBC 8.0 09/02/2024   HGB 11.2 (L) 09/02/2024   HCT 34.5 (L) 09/02/2024   MCV 81.0 09/02/2024   PLT 69 (L) 09/02/2024   NEUTROABS 5.3 09/02/2024    Imaging:  No results found.  Medications: I have reviewed the patient's current medications.  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  History of anemia secondary to menorrhagia and iron deficiency-recurrent iron deficiency anemia August 2024  Disposition: Daisy Meyers appears stable.  Platelet count remains in an adequate range.  She will continue Promacta .  She has a history of iron deficiency anemia.  Hemoglobin is stable.  She will continue oral iron.  She will return for lab and follow-up as scheduled 10/20/2024.    Olam Ned ANP/GNP-BC   09/02/2024  2:17 PM

## 2024-09-05 ENCOUNTER — Encounter (INDEPENDENT_AMBULATORY_CARE_PROVIDER_SITE_OTHER): Payer: Self-pay

## 2024-09-06 ENCOUNTER — Other Ambulatory Visit: Payer: Self-pay

## 2024-09-06 NOTE — Progress Notes (Signed)
 Specialty Pharmacy Refill Coordination Note  Daisy Meyers is a 28 y.o. female contacted today regarding refills of specialty medication(s) Eltrombopag  Olamine (PROMACTA )   Patient requested (Patient-Rptd) Pickup at [facility] at [facility] date: (Patient-Rptd) 09/23/24   Medication will be filled on 09/22/24.

## 2024-09-15 ENCOUNTER — Encounter: Payer: Self-pay | Admitting: Nurse Practitioner

## 2024-09-26 ENCOUNTER — Other Ambulatory Visit (HOSPITAL_COMMUNITY): Payer: Self-pay

## 2024-10-18 ENCOUNTER — Other Ambulatory Visit: Payer: Self-pay | Admitting: Nurse Practitioner

## 2024-10-18 ENCOUNTER — Other Ambulatory Visit: Payer: Self-pay

## 2024-10-18 DIAGNOSIS — D696 Thrombocytopenia, unspecified: Secondary | ICD-10-CM

## 2024-10-19 ENCOUNTER — Other Ambulatory Visit (HOSPITAL_COMMUNITY): Payer: Self-pay

## 2024-10-19 ENCOUNTER — Other Ambulatory Visit: Payer: Self-pay

## 2024-10-19 MED ORDER — ELTROMBOPAG OLAMINE 75 MG PO TABS
75.0000 mg | ORAL_TABLET | Freq: Every day | ORAL | 2 refills | Status: AC
  Filled 2024-10-19 – 2024-10-20 (×2): qty 30, 30d supply, fill #0
  Filled 2024-11-07 – 2024-11-21 (×2): qty 30, 30d supply, fill #1
  Filled 2024-12-30: qty 30, 30d supply, fill #2

## 2024-10-20 ENCOUNTER — Other Ambulatory Visit: Payer: Self-pay

## 2024-10-20 ENCOUNTER — Inpatient Hospital Stay: Admitting: Oncology

## 2024-10-20 ENCOUNTER — Telehealth: Payer: Self-pay | Admitting: Oncology

## 2024-10-20 ENCOUNTER — Other Ambulatory Visit: Payer: Self-pay | Admitting: Nurse Practitioner

## 2024-10-20 ENCOUNTER — Encounter (INDEPENDENT_AMBULATORY_CARE_PROVIDER_SITE_OTHER): Payer: Self-pay

## 2024-10-20 ENCOUNTER — Inpatient Hospital Stay: Attending: Oncology

## 2024-10-20 ENCOUNTER — Other Ambulatory Visit: Payer: Self-pay | Admitting: Pharmacy Technician

## 2024-10-20 VITALS — BP 120/81 | HR 98 | Temp 98.2°F | Resp 18 | Ht 64.0 in | Wt 274.4 lb

## 2024-10-20 DIAGNOSIS — Z79899 Other long term (current) drug therapy: Secondary | ICD-10-CM | POA: Diagnosis not present

## 2024-10-20 DIAGNOSIS — D500 Iron deficiency anemia secondary to blood loss (chronic): Secondary | ICD-10-CM | POA: Insufficient documentation

## 2024-10-20 DIAGNOSIS — D696 Thrombocytopenia, unspecified: Secondary | ICD-10-CM | POA: Diagnosis not present

## 2024-10-20 DIAGNOSIS — N920 Excessive and frequent menstruation with regular cycle: Secondary | ICD-10-CM | POA: Diagnosis not present

## 2024-10-20 DIAGNOSIS — D693 Immune thrombocytopenic purpura: Secondary | ICD-10-CM | POA: Diagnosis present

## 2024-10-20 LAB — CBC WITH DIFFERENTIAL (CANCER CENTER ONLY)
Abs Immature Granulocytes: 0.02 K/uL (ref 0.00–0.07)
Basophils Absolute: 0 K/uL (ref 0.0–0.1)
Basophils Relative: 0 %
Eosinophils Absolute: 0.2 K/uL (ref 0.0–0.5)
Eosinophils Relative: 2 %
HCT: 35.3 % — ABNORMAL LOW (ref 36.0–46.0)
Hemoglobin: 11.3 g/dL — ABNORMAL LOW (ref 12.0–15.0)
Immature Granulocytes: 0 %
Lymphocytes Relative: 23 %
Lymphs Abs: 1.9 K/uL (ref 0.7–4.0)
MCH: 26.2 pg (ref 26.0–34.0)
MCHC: 32 g/dL (ref 30.0–36.0)
MCV: 81.9 fL (ref 80.0–100.0)
Monocytes Absolute: 0.5 K/uL (ref 0.1–1.0)
Monocytes Relative: 6 %
Neutro Abs: 5.6 K/uL (ref 1.7–7.7)
Neutrophils Relative %: 69 %
Platelet Count: 15 K/uL — ABNORMAL LOW (ref 150–400)
RBC: 4.31 MIL/uL (ref 3.87–5.11)
RDW: 14.1 % (ref 11.5–15.5)
WBC Count: 8.2 K/uL (ref 4.0–10.5)
nRBC: 0 % (ref 0.0–0.2)

## 2024-10-20 LAB — CMP (CANCER CENTER ONLY)
ALT: 41 U/L (ref 0–44)
AST: 28 U/L (ref 15–41)
Albumin: 4.2 g/dL (ref 3.5–5.0)
Alkaline Phosphatase: 100 U/L (ref 38–126)
Anion gap: 10 (ref 5–15)
BUN: 12 mg/dL (ref 6–20)
CO2: 23 mmol/L (ref 22–32)
Calcium: 9.3 mg/dL (ref 8.9–10.3)
Chloride: 104 mmol/L (ref 98–111)
Creatinine: 0.73 mg/dL (ref 0.44–1.00)
GFR, Estimated: >60 mL/min (ref >60)
Glucose, Bld: 110 mg/dL — ABNORMAL HIGH (ref 70–99)
Potassium: 4.2 mmol/L (ref 3.5–5.1)
Sodium: 137 mmol/L (ref 135–145)
Total Bilirubin: 0.3 mg/dL (ref 0.0–1.2)
Total Protein: 7.3 g/dL (ref 6.5–8.1)

## 2024-10-20 NOTE — Progress Notes (Signed)
 Specialty Pharmacy Refill Coordination Note  Daisy Meyers is a 28 y.o. female contacted today regarding refills of specialty medication(s) Eltrombopag  Olamine (PROMACTA )   Patient requested (Patient-Rptd) Pickup at [facility] at [facility] date: (Patient-Rptd) 10/28/24   Medication will be filled on: 10/27/2024

## 2024-10-20 NOTE — Progress Notes (Signed)
  [facility] OFFICE PROGRESS NOTE   Diagnosis: ITP  INTERVAL HISTORY:   Ms. Friebel returns as scheduled.  She continues Promacta .  She returned from her honeymoon last weekend.  She reports moderate alcohol use on the honeymoon.  She bruised the lower abdominal wall while using water  equipment.  No other bleeding.  No fever or recent infection.  She missed a few doses of Promacta  while on vacation. She continues iron therapy. Objective:  Vital signs in last 24 hours:  Blood pressure 120/81, pulse 98, temperature 98.2 F (36.8 C), temperature source Temporal, resp. rate 18, height 5' 4 (1.626 m), weight 274 lb 6.4 oz (124.5 kg), SpO2 100%.    HEENT: Oral cavity without thrush, petechiae, or bleeding Resp: Lungs clear bilaterally Cardio: Regular rate and rhythm GI: No hepatosplenomegaly Vascular: No leg edema  Skin: Resolving small ecchymosis at the right forearm, ecchymoses at the suprapubic area and low abdominal pannus-appears to be resolving, no petechiae   Lab Results:  Lab Results  Component Value Date   WBC 8.2 10/20/2024   HGB 11.3 (L) 10/20/2024   HCT 35.3 (L) 10/20/2024   MCV 81.9 10/20/2024   PLT 15 (L) 10/20/2024   NEUTROABS PENDING 10/20/2024    CMP  Lab Results  Component Value Date   NA 137 09/02/2024   K 3.9 09/02/2024   CL 102 09/02/2024   CO2 21 (L) 09/02/2024   GLUCOSE 99 09/02/2024   BUN 10 09/02/2024   CREATININE 0.82 09/02/2024   CALCIUM 9.7 09/02/2024   PROT 7.8 09/02/2024   ALBUMIN 4.5 09/02/2024   AST 17 09/02/2024   ALT 14 09/02/2024   ALKPHOS 96 09/02/2024   BILITOT 0.4 09/02/2024   GFRNONAA >60 09/02/2024   GFRAA 101 05/25/2020      Medications: I have reviewed the patient's current medications.   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  The platelet count has been adequate since starting Promacta  last year.  The platelet count is lower today.  This could be related to missing recent doses of Promacta  or progression of the ITP.  We discussed treatment options.  She does not wish to resume prednisone  at present.  She understands the risk of bleeding with a platelet count at this level.  She will return for a CBC tomorrow.  She will be scheduled for an office visit and CBC on 10/25/2024.  She will call for spontaneous bleeding or bruising.  We discussed salvage treatment options for the ITP including resumption of prednisone , pulsed Decadron , Nplate, and IVIG therapy.  We can also consider fostamatinib.  Arley Hof, MD  10/20/2024  8:44 AM

## 2024-10-20 NOTE — Telephone Encounter (Signed)
 Called PT to let her know that her lab appt would be at [facility]. Left voicemail message.

## 2024-10-21 ENCOUNTER — Inpatient Hospital Stay

## 2024-10-21 ENCOUNTER — Telehealth: Payer: Self-pay | Admitting: Nurse Practitioner

## 2024-10-21 DIAGNOSIS — D693 Immune thrombocytopenic purpura: Secondary | ICD-10-CM | POA: Diagnosis not present

## 2024-10-21 DIAGNOSIS — D696 Thrombocytopenia, unspecified: Secondary | ICD-10-CM

## 2024-10-21 LAB — CBC WITH DIFFERENTIAL (CANCER CENTER ONLY)
Abs Immature Granulocytes: 0.03 K/uL (ref 0.00–0.07)
Basophils Absolute: 0 K/uL (ref 0.0–0.1)
Basophils Relative: 0 %
Eosinophils Absolute: 0.2 K/uL (ref 0.0–0.5)
Eosinophils Relative: 2 %
HCT: 34.7 % — ABNORMAL LOW (ref 36.0–46.0)
Hemoglobin: 11.5 g/dL — ABNORMAL LOW (ref 12.0–15.0)
Immature Granulocytes: 0 %
Lymphocytes Relative: 24 %
Lymphs Abs: 2.3 K/uL (ref 0.7–4.0)
MCH: 26.5 pg (ref 26.0–34.0)
MCHC: 33.1 g/dL (ref 30.0–36.0)
MCV: 80 fL (ref 80.0–100.0)
Monocytes Absolute: 0.5 K/uL (ref 0.1–1.0)
Monocytes Relative: 5 %
Neutro Abs: 6.8 K/uL (ref 1.7–7.7)
Neutrophils Relative %: 69 %
Platelet Count: 19 K/uL — ABNORMAL LOW (ref 150–400)
RBC: 4.34 MIL/uL (ref 3.87–5.11)
RDW: 14 % (ref 11.5–15.5)
WBC Count: 9.8 K/uL (ref 4.0–10.5)
nRBC: 0 % (ref 0.0–0.2)

## 2024-10-21 NOTE — Telephone Encounter (Signed)
 I spoke with [name] regarding the platelet count from today which returned at 19,000.  She reports a stable ecchymosis at the low abdomen which seems to be resolving.  No new bruising/bleeding.  She will continue Promacta  and follow-up as scheduled on 10/25/2024.

## 2024-10-24 ENCOUNTER — Other Ambulatory Visit: Payer: Self-pay

## 2024-10-24 NOTE — Progress Notes (Signed)
 Clinical Intervention Note  Clinical Intervention Notes: Patient reported on questionnaire that she missed 6 doses of Promacta  this month. Per OV on 11/6, patient reported that she was on a honeymoon and forget doses. Dr. Deanne is aware and monitoring lab work as platelets have decreased (per visit, could be from missing doses or progression). Patient also has a follow up appointment scheduled for tomorrow.   Clinical Intervention Outcomes: Improved therapy adherence   [facility] Specialty Pharmacist

## 2024-10-25 ENCOUNTER — Telehealth: Payer: Self-pay | Admitting: Nurse Practitioner

## 2024-10-25 ENCOUNTER — Inpatient Hospital Stay (HOSPITAL_BASED_OUTPATIENT_CLINIC_OR_DEPARTMENT_OTHER): Admitting: Nurse Practitioner

## 2024-10-25 ENCOUNTER — Inpatient Hospital Stay

## 2024-10-25 ENCOUNTER — Encounter: Payer: Self-pay | Admitting: Nurse Practitioner

## 2024-10-25 VITALS — BP 127/82 | HR 99 | Temp 97.8°F | Resp 18 | Ht 64.0 in | Wt 272.5 lb

## 2024-10-25 DIAGNOSIS — D693 Immune thrombocytopenic purpura: Secondary | ICD-10-CM | POA: Diagnosis not present

## 2024-10-25 DIAGNOSIS — D696 Thrombocytopenia, unspecified: Secondary | ICD-10-CM

## 2024-10-25 DIAGNOSIS — D509 Iron deficiency anemia, unspecified: Secondary | ICD-10-CM

## 2024-10-25 LAB — CBC WITH DIFFERENTIAL (CANCER CENTER ONLY)
Abs Immature Granulocytes: 0.02 K/uL (ref 0.00–0.07)
Basophils Absolute: 0 K/uL (ref 0.0–0.1)
Basophils Relative: 0 %
Eosinophils Absolute: 0.1 K/uL (ref 0.0–0.5)
Eosinophils Relative: 1 %
HCT: 34.2 % — ABNORMAL LOW (ref 36.0–46.0)
Hemoglobin: 10.9 g/dL — ABNORMAL LOW (ref 12.0–15.0)
Immature Granulocytes: 0 %
Lymphocytes Relative: 23 %
Lymphs Abs: 1.9 K/uL (ref 0.7–4.0)
MCH: 26.1 pg (ref 26.0–34.0)
MCHC: 31.9 g/dL (ref 30.0–36.0)
MCV: 81.8 fL (ref 80.0–100.0)
Monocytes Absolute: 0.4 K/uL (ref 0.1–1.0)
Monocytes Relative: 5 %
Neutro Abs: 5.7 K/uL (ref 1.7–7.7)
Neutrophils Relative %: 71 %
Platelet Count: 30 K/uL — ABNORMAL LOW (ref 150–400)
RBC: 4.18 MIL/uL (ref 3.87–5.11)
RDW: 13.8 % (ref 11.5–15.5)
WBC Count: 8.1 K/uL (ref 4.0–10.5)
nRBC: 0 % (ref 0.0–0.2)

## 2024-10-25 LAB — FERRITIN: Ferritin: 34 ng/mL (ref 11–307)

## 2024-10-25 NOTE — Progress Notes (Signed)
   [facility] OFF[facility] PROGRESS [facility]   Diagnosis: ITP  INTERVAL HISTORY:   Daisy Meyers returns as scheduled.  She continues Promacta .  She denies bleeding.  Prior ecchymosis at the abdominal wall has resolved.  She continues oral iron.  No nausea or constipation.  Objective:  Vital signs in last 24 hours:  Blood pressure 127/82, pulse 99, temperature 97.8 F (36.6 C), temperature source Temporal, resp. rate 18, height 5' 4 (1.626 m), weight 272 lb 8 oz (123.6 kg), SpO2 100%.    HEENT: Oral cavity without thrush, petechiae or bleeding. Resp: Lungs clear bilaterally. Cardio: Regular rate and rhythm. GI: No hepatosplenomegaly. Vascular: No leg edema. Skin: Very faint resolving ecchymosis at the lower abdominal wall and right forearm.  No petechiae.   Lab Results:  Lab Results  Component Value Date   WBC 9.8 10/21/2024   HGB 11.5 (L) 10/21/2024   HCT 34.7 (L) 10/21/2024   MCV 80.0 10/21/2024   PLT 19 (L) 10/21/2024   NEUTROABS 6.8 10/21/2024    Imaging:  No results found.  Medications: I have reviewed the patient's current medications.  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  History of anemia secondary to menorrhagia and iron deficiency-recurrent iron deficiency anemia August 2024  Disposition: Daisy Meyers appears stable.  She confirms taking Promacta  consistently since the last office visit.  Platelet count is higher today.  Plan to continue Promacta  and return for a follow-up CBC in 2 weeks.  She understands to contact the office with spontaneous bleeding or bruising.  Lab in 2 weeks.  She will return for an office visit in 4 weeks.  We are available to see her sooner if needed.  Plan reviewed with Dr. Cloretta.  Olam Ned ANP/GNP-BC   10/25/2024  8:24 AM

## 2024-10-25 NOTE — Telephone Encounter (Signed)
 Called PT to confirm updated appt day and time.

## 2024-10-27 ENCOUNTER — Other Ambulatory Visit: Payer: Self-pay

## 2024-10-29 ENCOUNTER — Other Ambulatory Visit (HOSPITAL_COMMUNITY): Payer: Self-pay

## 2024-11-07 ENCOUNTER — Other Ambulatory Visit: Payer: Self-pay

## 2024-11-07 ENCOUNTER — Other Ambulatory Visit (HOSPITAL_COMMUNITY): Payer: Self-pay

## 2024-11-07 NOTE — Progress Notes (Signed)
 Specialty Pharmacy Ongoing Clinical Assessment Note  Daisy Meyers is a 28 y.o. female who is being followed by the specialty pharmacy service for RxSp Bleeding Disorders   Patient's specialty medication(s) reviewed today: Eltrombopag  Olamine (PROMACTA )   Missed doses in the last 4 weeks: 0   Patient/Caregiver did not have any additional questions or concerns.   Therapeutic benefit summary: Patient is achieving benefit   Adverse events/side effects summary: No adverse events/side effects   Patient's therapy is appropriate to: Continue    Goals Addressed             This Visit's Progress    Reduce long-term complications   On track    Patient is on track. Patient will maintain adherence. Per recent provider note from 10/25/24, patient remains stable and plan is to continue current treatment and follow up with additional CBC.         Follow up: 6 months  Delaila Nand M [facility] Specialty Pharmacist

## 2024-11-08 ENCOUNTER — Inpatient Hospital Stay

## 2024-11-08 DIAGNOSIS — D693 Immune thrombocytopenic purpura: Secondary | ICD-10-CM | POA: Diagnosis not present

## 2024-11-08 DIAGNOSIS — D509 Iron deficiency anemia, unspecified: Secondary | ICD-10-CM

## 2024-11-08 DIAGNOSIS — D696 Thrombocytopenia, unspecified: Secondary | ICD-10-CM

## 2024-11-08 LAB — CBC WITH DIFFERENTIAL (CANCER CENTER ONLY)
Abs Immature Granulocytes: 0.01 K/uL (ref 0.00–0.07)
Basophils Absolute: 0 K/uL (ref 0.0–0.1)
Basophils Relative: 0 %
Eosinophils Absolute: 0.2 K/uL (ref 0.0–0.5)
Eosinophils Relative: 3 %
HCT: 33.8 % — ABNORMAL LOW (ref 36.0–46.0)
Hemoglobin: 11.1 g/dL — ABNORMAL LOW (ref 12.0–15.0)
Immature Granulocytes: 0 %
Lymphocytes Relative: 32 %
Lymphs Abs: 2 K/uL (ref 0.7–4.0)
MCH: 26.5 pg (ref 26.0–34.0)
MCHC: 32.8 g/dL (ref 30.0–36.0)
MCV: 80.7 fL (ref 80.0–100.0)
Monocytes Absolute: 0.4 K/uL (ref 0.1–1.0)
Monocytes Relative: 6 %
Neutro Abs: 3.6 K/uL (ref 1.7–7.7)
Neutrophils Relative %: 59 %
Platelet Count: 60 K/uL — ABNORMAL LOW (ref 150–400)
RBC: 4.19 MIL/uL (ref 3.87–5.11)
RDW: 13.3 % (ref 11.5–15.5)
WBC Count: 6.2 K/uL (ref 4.0–10.5)
nRBC: 0 % (ref 0.0–0.2)

## 2024-11-21 ENCOUNTER — Other Ambulatory Visit: Payer: Self-pay

## 2024-11-21 ENCOUNTER — Other Ambulatory Visit (HOSPITAL_COMMUNITY): Payer: Self-pay

## 2024-11-21 NOTE — Progress Notes (Signed)
 Specialty Pharmacy Refill Coordination Note  MyChart Questionnaire Submission  Daisy Meyers is a 28 y.o. female contacted today regarding refills of specialty medication(s) Promacta .  Doses on hand: (Patient-Rptd) 20   Patient requested: (Patient-Rptd) Pickup at [facility] at [facility] date: 12/09/24  Medication will be filled on 12/07/24.

## 2024-11-22 ENCOUNTER — Encounter: Payer: Self-pay | Admitting: Nurse Practitioner

## 2024-11-22 ENCOUNTER — Inpatient Hospital Stay

## 2024-11-22 ENCOUNTER — Inpatient Hospital Stay: Admitting: Nurse Practitioner

## 2024-11-22 ENCOUNTER — Inpatient Hospital Stay: Attending: Oncology

## 2024-11-22 VITALS — BP 108/71 | HR 87 | Temp 97.7°F | Resp 16 | Wt 270.8 lb

## 2024-11-22 DIAGNOSIS — D696 Thrombocytopenia, unspecified: Secondary | ICD-10-CM

## 2024-11-22 DIAGNOSIS — D509 Iron deficiency anemia, unspecified: Secondary | ICD-10-CM | POA: Diagnosis not present

## 2024-11-22 DIAGNOSIS — Z79899 Other long term (current) drug therapy: Secondary | ICD-10-CM | POA: Insufficient documentation

## 2024-11-22 DIAGNOSIS — N920 Excessive and frequent menstruation with regular cycle: Secondary | ICD-10-CM | POA: Insufficient documentation

## 2024-11-22 DIAGNOSIS — D500 Iron deficiency anemia secondary to blood loss (chronic): Secondary | ICD-10-CM | POA: Diagnosis not present

## 2024-11-22 DIAGNOSIS — D693 Immune thrombocytopenic purpura: Secondary | ICD-10-CM | POA: Insufficient documentation

## 2024-11-22 LAB — CBC WITH DIFFERENTIAL (CANCER CENTER ONLY)
Abs Immature Granulocytes: 0.02 K/uL (ref 0.00–0.07)
Basophils Absolute: 0 K/uL (ref 0.0–0.1)
Basophils Relative: 0 %
Eosinophils Absolute: 0.2 K/uL (ref 0.0–0.5)
Eosinophils Relative: 2 %
HCT: 36.2 % (ref 36.0–46.0)
Hemoglobin: 11.8 g/dL — ABNORMAL LOW (ref 12.0–15.0)
Immature Granulocytes: 0 %
Lymphocytes Relative: 24 %
Lymphs Abs: 1.9 K/uL (ref 0.7–4.0)
MCH: 25.9 pg — ABNORMAL LOW (ref 26.0–34.0)
MCHC: 32.6 g/dL (ref 30.0–36.0)
MCV: 79.4 fL — ABNORMAL LOW (ref 80.0–100.0)
Monocytes Absolute: 0.4 K/uL (ref 0.1–1.0)
Monocytes Relative: 5 %
Neutro Abs: 5.5 K/uL (ref 1.7–7.7)
Neutrophils Relative %: 69 %
Platelet Count: 39 K/uL — ABNORMAL LOW (ref 150–400)
RBC: 4.56 MIL/uL (ref 3.87–5.11)
RDW: 13.7 % (ref 11.5–15.5)
WBC Count: 8.1 K/uL (ref 4.0–10.5)
nRBC: 0 % (ref 0.0–0.2)

## 2024-11-22 NOTE — Progress Notes (Signed)
  [facility] OFFICE PROGRESS NOTE   Diagnosis: ITP  INTERVAL HISTORY:   Ms. Avallone returns as scheduled.  She continues Promacta .  No bleeding except related to her menstrual cycle.  No missed doses of Promacta .  She continues oral iron.  No constipation or nausea.  Objective:  Vital signs in last 24 hours:  Blood pressure 108/71, pulse 87, temperature 97.7 F (36.5 C), temperature source Temporal, resp. rate 16, weight 270 lb 12.8 oz (122.8 kg), SpO2 100%.    HEENT: No thrush or ulcers.  No bleeding in the oral cavity. Resp: Lungs clear bilaterally. Cardio: Regular rate and rhythm. GI: No hepatosplenomegaly. Vascular: No leg edema. Neuro: Alert and oriented. Skin: No petechiae/ecchymoses.   Lab Results:  Lab Results  Component Value Date   WBC 8.1 11/22/2024   HGB 11.8 (L) 11/22/2024   HCT 36.2 11/22/2024   MCV 79.4 (L) 11/22/2024   PLT 39 (L) 11/22/2024   NEUTROABS PENDING 11/22/2024    Imaging:  No results found.  Medications: I have reviewed the patient's current medications.  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  History of anemia secondary to  menorrhagia and iron deficiency-recurrent iron deficiency anemia August 2024  Disposition: Ms. Lannan appears stable.  She continues Promacta  for ITP.  Platelet count is lower than 2 weeks ago but remains in adequate range.  She will continue Promacta .  She understands to contact the office with spontaneous bleeding/bruising.  She continues oral iron for iron deficiency anemia.  She will return for a CBC in 2 weeks and 4 weeks.  Office visit in 6 weeks.  We are available to see her sooner if needed.  Plan reviewed with Dr. Cloretta.    Olam Ned ANP/GNP-BC   11/22/2024  8:07 AM

## 2024-12-02 ENCOUNTER — Telehealth: Payer: Self-pay | Admitting: Oncology

## 2024-12-02 NOTE — Telephone Encounter (Signed)
 PT called to reschedule appt @ [facility], updated day and time confirmed.

## 2024-12-05 ENCOUNTER — Inpatient Hospital Stay

## 2024-12-06 ENCOUNTER — Inpatient Hospital Stay

## 2024-12-06 DIAGNOSIS — D509 Iron deficiency anemia, unspecified: Secondary | ICD-10-CM

## 2024-12-06 DIAGNOSIS — D696 Thrombocytopenia, unspecified: Secondary | ICD-10-CM

## 2024-12-06 DIAGNOSIS — D693 Immune thrombocytopenic purpura: Secondary | ICD-10-CM | POA: Diagnosis not present

## 2024-12-06 LAB — CBC WITH DIFFERENTIAL (CANCER CENTER ONLY)
Abs Immature Granulocytes: 0.02 K/uL (ref 0.00–0.07)
Basophils Absolute: 0 K/uL (ref 0.0–0.1)
Basophils Relative: 0 %
Eosinophils Absolute: 0.1 K/uL (ref 0.0–0.5)
Eosinophils Relative: 2 %
HCT: 35.5 % — ABNORMAL LOW (ref 36.0–46.0)
Hemoglobin: 11.6 g/dL — ABNORMAL LOW (ref 12.0–15.0)
Immature Granulocytes: 0 %
Lymphocytes Relative: 29 %
Lymphs Abs: 2.3 K/uL (ref 0.7–4.0)
MCH: 26.1 pg (ref 26.0–34.0)
MCHC: 32.7 g/dL (ref 30.0–36.0)
MCV: 80 fL (ref 80.0–100.0)
Monocytes Absolute: 0.3 K/uL (ref 0.1–1.0)
Monocytes Relative: 4 %
Neutro Abs: 5.1 K/uL (ref 1.7–7.7)
Neutrophils Relative %: 65 %
Platelet Count: 60 K/uL — ABNORMAL LOW (ref 150–400)
RBC: 4.44 MIL/uL (ref 3.87–5.11)
RDW: 14 % (ref 11.5–15.5)
WBC Count: 7.9 K/uL (ref 4.0–10.5)
nRBC: 0 % (ref 0.0–0.2)

## 2024-12-06 LAB — FERRITIN: Ferritin: 22 ng/mL (ref 11–307)

## 2024-12-07 ENCOUNTER — Other Ambulatory Visit (HOSPITAL_COMMUNITY): Payer: Self-pay

## 2024-12-07 ENCOUNTER — Other Ambulatory Visit: Payer: Self-pay

## 2024-12-07 MED ORDER — METRONIDAZOLE 500 MG PO TABS
500.0000 mg | ORAL_TABLET | Freq: Two times a day (BID) | ORAL | 0 refills | Status: AC
  Filled 2024-12-07: qty 14, 7d supply, fill #0

## 2024-12-19 ENCOUNTER — Inpatient Hospital Stay: Attending: Oncology

## 2024-12-30 ENCOUNTER — Other Ambulatory Visit (HOSPITAL_COMMUNITY): Payer: Self-pay

## 2025-01-03 ENCOUNTER — Other Ambulatory Visit: Payer: Self-pay

## 2025-01-04 ENCOUNTER — Other Ambulatory Visit: Payer: Self-pay

## 2025-01-04 NOTE — Progress Notes (Signed)
 Specialty Pharmacy Refill Coordination Note  Daisy Meyers is a 29 y.o. female contacted today regarding refills of specialty medication(s) Eltrombopag  Olamine (PROMACTA )   Patient requested [name] at [facility] at [place] date: 01/05/25   Medication will be filled on: 01/05/25

## 2025-01-05 ENCOUNTER — Other Ambulatory Visit: Payer: Self-pay

## 2025-01-09 ENCOUNTER — Other Ambulatory Visit (HOSPITAL_COMMUNITY): Payer: Self-pay

## 2025-01-10 ENCOUNTER — Other Ambulatory Visit (HOSPITAL_COMMUNITY): Payer: Self-pay

## 2025-01-16 ENCOUNTER — Encounter: Payer: Self-pay | Admitting: Nurse Practitioner

## 2025-01-17 ENCOUNTER — Telehealth: Payer: Self-pay | Admitting: Oncology

## 2025-01-17 NOTE — Telephone Encounter (Signed)
 Returning PT call to reschedule appt; left detailed message on PT voicemail.

## 2025-01-18 ENCOUNTER — Encounter: Payer: Self-pay | Admitting: Nurse Practitioner

## 2025-01-18 ENCOUNTER — Other Ambulatory Visit: Payer: Self-pay | Admitting: *Deleted

## 2025-01-18 DIAGNOSIS — D696 Thrombocytopenia, unspecified: Secondary | ICD-10-CM

## 2025-01-18 DIAGNOSIS — D509 Iron deficiency anemia, unspecified: Secondary | ICD-10-CM

## 2025-01-19 ENCOUNTER — Telehealth: Payer: Self-pay | Admitting: *Deleted

## 2025-01-19 ENCOUNTER — Inpatient Hospital Stay: Attending: Oncology

## 2025-01-19 DIAGNOSIS — D509 Iron deficiency anemia, unspecified: Secondary | ICD-10-CM

## 2025-01-19 DIAGNOSIS — D696 Thrombocytopenia, unspecified: Secondary | ICD-10-CM

## 2025-01-19 LAB — CBC WITH DIFFERENTIAL (CANCER CENTER ONLY)
Abs Immature Granulocytes: 0.01 10*3/uL (ref 0.00–0.07)
Basophils Absolute: 0 10*3/uL (ref 0.0–0.1)
Basophils Relative: 0 %
Eosinophils Absolute: 0.2 10*3/uL (ref 0.0–0.5)
Eosinophils Relative: 3 %
HCT: 28.5 % — ABNORMAL LOW (ref 36.0–46.0)
Hemoglobin: 9 g/dL — ABNORMAL LOW (ref 12.0–15.0)
Immature Granulocytes: 0 %
Lymphocytes Relative: 30 %
Lymphs Abs: 1.8 10*3/uL (ref 0.7–4.0)
MCH: 24.5 pg — ABNORMAL LOW (ref 26.0–34.0)
MCHC: 31.6 g/dL (ref 30.0–36.0)
MCV: 77.7 fL — ABNORMAL LOW (ref 80.0–100.0)
Monocytes Absolute: 0.3 10*3/uL (ref 0.1–1.0)
Monocytes Relative: 5 %
Neutro Abs: 3.8 10*3/uL (ref 1.7–7.7)
Neutrophils Relative %: 62 %
Platelet Count: 33 10*3/uL — ABNORMAL LOW (ref 150–400)
RBC: 3.67 MIL/uL — ABNORMAL LOW (ref 3.87–5.11)
RDW: 13.9 % (ref 11.5–15.5)
WBC Count: 6.2 10*3/uL (ref 4.0–10.5)
nRBC: 0 % (ref 0.0–0.2)

## 2025-01-19 LAB — FERRITIN: Ferritin: 11 ng/mL (ref 11–307)

## 2025-01-19 LAB — SAMPLE TO BLOOD BANK

## 2025-01-19 NOTE — Telephone Encounter (Signed)
 LVM to check Mychart message re: labs.

## 2025-01-20 ENCOUNTER — Telehealth: Payer: Self-pay | Admitting: *Deleted

## 2025-01-20 NOTE — Telephone Encounter (Signed)
 LVM for patient to call back with her decision on what she wants to do. Take Hemocyte daily and recheck CBC in 1 week or come in to talk about IV iron?

## 2025-01-23 ENCOUNTER — Inpatient Hospital Stay

## 2025-01-23 ENCOUNTER — Inpatient Hospital Stay: Admitting: Oncology

## 2025-03-02 ENCOUNTER — Inpatient Hospital Stay

## 2025-03-02 ENCOUNTER — Inpatient Hospital Stay: Admitting: Oncology
